# Patient Record
Sex: Female | Born: 1940
Health system: Southern US, Community
[De-identification: ages and names within clinical notes are randomized; demographics above are authoritative.]

## PROBLEM LIST (undated history)

## (undated) DIAGNOSIS — E785 Hyperlipidemia, unspecified: Secondary | ICD-10-CM

## (undated) DIAGNOSIS — Z9889 Other specified postprocedural states: Secondary | ICD-10-CM

## (undated) DIAGNOSIS — K219 Gastro-esophageal reflux disease without esophagitis: Secondary | ICD-10-CM

## (undated) DIAGNOSIS — Z8669 Personal history of other diseases of the nervous system and sense organs: Secondary | ICD-10-CM

## (undated) DIAGNOSIS — I10 Essential (primary) hypertension: Secondary | ICD-10-CM

## (undated) HISTORY — PX: RETINAL DETACHMENT SURGERY: SHX105

## (undated) HISTORY — DX: Hyperlipidemia, unspecified: E78.5

## (undated) HISTORY — DX: Gastro-esophageal reflux disease without esophagitis: K21.9

## (undated) HISTORY — PX: COLONOSCOPY: SHX174

## (undated) HISTORY — PX: ABDOMINAL HYSTERECTOMY: SHX81

---

## 2005-02-17 ENCOUNTER — Ambulatory Visit: Payer: Self-pay | Admitting: Internal Medicine

## 2005-05-19 ENCOUNTER — Ambulatory Visit: Payer: Self-pay

## 2006-02-27 ENCOUNTER — Ambulatory Visit: Payer: Self-pay | Admitting: Internal Medicine

## 2006-06-11 ENCOUNTER — Ambulatory Visit: Payer: Self-pay

## 2006-09-07 ENCOUNTER — Ambulatory Visit: Payer: Self-pay | Admitting: Gastroenterology

## 2007-02-16 ENCOUNTER — Ambulatory Visit: Payer: Self-pay | Admitting: Family Medicine

## 2007-03-03 ENCOUNTER — Ambulatory Visit: Payer: Self-pay | Admitting: Internal Medicine

## 2008-03-16 ENCOUNTER — Ambulatory Visit: Payer: Self-pay | Admitting: Internal Medicine

## 2008-12-06 ENCOUNTER — Ambulatory Visit: Payer: Self-pay | Admitting: Family Medicine

## 2009-03-22 ENCOUNTER — Ambulatory Visit: Payer: Self-pay | Admitting: Unknown Physician Specialty

## 2009-03-26 ENCOUNTER — Ambulatory Visit: Payer: Self-pay | Admitting: Internal Medicine

## 2010-04-08 ENCOUNTER — Ambulatory Visit: Payer: Self-pay | Admitting: Internal Medicine

## 2010-04-17 ENCOUNTER — Ambulatory Visit: Payer: Self-pay | Admitting: Internal Medicine

## 2011-04-24 ENCOUNTER — Ambulatory Visit: Payer: Self-pay | Admitting: Internal Medicine

## 2012-04-15 DIAGNOSIS — M545 Low back pain: Secondary | ICD-10-CM | POA: Diagnosis not present

## 2012-04-15 DIAGNOSIS — R300 Dysuria: Secondary | ICD-10-CM | POA: Diagnosis not present

## 2012-04-15 DIAGNOSIS — Z0000 Encounter for general adult medical examination without abnormal findings: Secondary | ICD-10-CM | POA: Diagnosis not present

## 2012-04-15 DIAGNOSIS — M949 Disorder of cartilage, unspecified: Secondary | ICD-10-CM | POA: Diagnosis not present

## 2012-04-15 DIAGNOSIS — I10 Essential (primary) hypertension: Secondary | ICD-10-CM | POA: Diagnosis not present

## 2012-04-15 DIAGNOSIS — E782 Mixed hyperlipidemia: Secondary | ICD-10-CM | POA: Diagnosis not present

## 2012-04-15 DIAGNOSIS — M899 Disorder of bone, unspecified: Secondary | ICD-10-CM | POA: Diagnosis not present

## 2012-04-15 DIAGNOSIS — H44119 Panuveitis, unspecified eye: Secondary | ICD-10-CM | POA: Diagnosis not present

## 2012-04-15 DIAGNOSIS — M79609 Pain in unspecified limb: Secondary | ICD-10-CM | POA: Diagnosis not present

## 2012-04-23 DIAGNOSIS — H40059 Ocular hypertension, unspecified eye: Secondary | ICD-10-CM | POA: Diagnosis not present

## 2012-04-23 DIAGNOSIS — H209 Unspecified iridocyclitis: Secondary | ICD-10-CM | POA: Diagnosis not present

## 2012-04-23 DIAGNOSIS — H44119 Panuveitis, unspecified eye: Secondary | ICD-10-CM | POA: Diagnosis not present

## 2012-04-29 ENCOUNTER — Ambulatory Visit: Payer: Self-pay | Admitting: Internal Medicine

## 2012-04-29 DIAGNOSIS — Z1231 Encounter for screening mammogram for malignant neoplasm of breast: Secondary | ICD-10-CM | POA: Diagnosis not present

## 2012-10-26 DIAGNOSIS — E782 Mixed hyperlipidemia: Secondary | ICD-10-CM | POA: Diagnosis not present

## 2012-10-26 DIAGNOSIS — R5381 Other malaise: Secondary | ICD-10-CM | POA: Diagnosis not present

## 2012-10-26 DIAGNOSIS — Z23 Encounter for immunization: Secondary | ICD-10-CM | POA: Diagnosis not present

## 2012-10-26 DIAGNOSIS — M545 Low back pain: Secondary | ICD-10-CM | POA: Diagnosis not present

## 2012-10-26 DIAGNOSIS — I10 Essential (primary) hypertension: Secondary | ICD-10-CM | POA: Diagnosis not present

## 2012-10-29 DIAGNOSIS — H40059 Ocular hypertension, unspecified eye: Secondary | ICD-10-CM | POA: Diagnosis not present

## 2012-10-29 DIAGNOSIS — Z961 Presence of intraocular lens: Secondary | ICD-10-CM | POA: Diagnosis not present

## 2012-10-29 DIAGNOSIS — H268 Other specified cataract: Secondary | ICD-10-CM | POA: Diagnosis not present

## 2012-10-29 DIAGNOSIS — H209 Unspecified iridocyclitis: Secondary | ICD-10-CM | POA: Diagnosis not present

## 2012-10-29 DIAGNOSIS — H269 Unspecified cataract: Secondary | ICD-10-CM | POA: Diagnosis not present

## 2012-10-29 DIAGNOSIS — H3320 Serous retinal detachment, unspecified eye: Secondary | ICD-10-CM | POA: Diagnosis not present

## 2012-10-29 DIAGNOSIS — H44119 Panuveitis, unspecified eye: Secondary | ICD-10-CM | POA: Diagnosis not present

## 2013-04-01 DIAGNOSIS — H40059 Ocular hypertension, unspecified eye: Secondary | ICD-10-CM | POA: Diagnosis not present

## 2013-04-01 DIAGNOSIS — H3320 Serous retinal detachment, unspecified eye: Secondary | ICD-10-CM | POA: Diagnosis not present

## 2013-04-01 DIAGNOSIS — H44119 Panuveitis, unspecified eye: Secondary | ICD-10-CM | POA: Diagnosis not present

## 2013-04-01 DIAGNOSIS — Z961 Presence of intraocular lens: Secondary | ICD-10-CM | POA: Diagnosis not present

## 2013-05-18 DIAGNOSIS — Z006 Encounter for examination for normal comparison and control in clinical research program: Secondary | ICD-10-CM | POA: Diagnosis not present

## 2013-05-18 DIAGNOSIS — E782 Mixed hyperlipidemia: Secondary | ICD-10-CM | POA: Diagnosis not present

## 2013-05-18 DIAGNOSIS — Z0000 Encounter for general adult medical examination without abnormal findings: Secondary | ICD-10-CM | POA: Diagnosis not present

## 2013-05-18 DIAGNOSIS — R300 Dysuria: Secondary | ICD-10-CM | POA: Diagnosis not present

## 2013-05-18 DIAGNOSIS — R635 Abnormal weight gain: Secondary | ICD-10-CM | POA: Diagnosis not present

## 2013-05-18 DIAGNOSIS — Z79899 Other long term (current) drug therapy: Secondary | ICD-10-CM | POA: Diagnosis not present

## 2013-05-18 DIAGNOSIS — I10 Essential (primary) hypertension: Secondary | ICD-10-CM | POA: Diagnosis not present

## 2013-05-20 ENCOUNTER — Ambulatory Visit: Payer: Self-pay | Admitting: Physician Assistant

## 2013-05-20 DIAGNOSIS — Z1231 Encounter for screening mammogram for malignant neoplasm of breast: Secondary | ICD-10-CM | POA: Diagnosis not present

## 2013-05-31 DIAGNOSIS — H269 Unspecified cataract: Secondary | ICD-10-CM | POA: Diagnosis not present

## 2013-05-31 DIAGNOSIS — Z961 Presence of intraocular lens: Secondary | ICD-10-CM | POA: Diagnosis not present

## 2013-06-08 DIAGNOSIS — E782 Mixed hyperlipidemia: Secondary | ICD-10-CM | POA: Diagnosis not present

## 2013-06-08 DIAGNOSIS — N390 Urinary tract infection, site not specified: Secondary | ICD-10-CM | POA: Diagnosis not present

## 2013-06-08 DIAGNOSIS — M25579 Pain in unspecified ankle and joints of unspecified foot: Secondary | ICD-10-CM | POA: Diagnosis not present

## 2013-06-08 DIAGNOSIS — R7301 Impaired fasting glucose: Secondary | ICD-10-CM | POA: Diagnosis not present

## 2013-06-08 DIAGNOSIS — I10 Essential (primary) hypertension: Secondary | ICD-10-CM | POA: Diagnosis not present

## 2013-06-08 DIAGNOSIS — E876 Hypokalemia: Secondary | ICD-10-CM | POA: Diagnosis not present

## 2013-07-13 DIAGNOSIS — E876 Hypokalemia: Secondary | ICD-10-CM | POA: Diagnosis not present

## 2013-09-30 DIAGNOSIS — H3320 Serous retinal detachment, unspecified eye: Secondary | ICD-10-CM | POA: Diagnosis not present

## 2013-09-30 DIAGNOSIS — Z961 Presence of intraocular lens: Secondary | ICD-10-CM | POA: Diagnosis not present

## 2013-09-30 DIAGNOSIS — H269 Unspecified cataract: Secondary | ICD-10-CM | POA: Diagnosis not present

## 2013-09-30 DIAGNOSIS — H44119 Panuveitis, unspecified eye: Secondary | ICD-10-CM | POA: Diagnosis not present

## 2013-09-30 DIAGNOSIS — H40059 Ocular hypertension, unspecified eye: Secondary | ICD-10-CM | POA: Diagnosis not present

## 2013-10-22 DIAGNOSIS — Z23 Encounter for immunization: Secondary | ICD-10-CM | POA: Diagnosis not present

## 2013-11-14 DIAGNOSIS — E876 Hypokalemia: Secondary | ICD-10-CM | POA: Diagnosis not present

## 2013-11-14 DIAGNOSIS — R300 Dysuria: Secondary | ICD-10-CM | POA: Diagnosis not present

## 2013-11-14 DIAGNOSIS — H6120 Impacted cerumen, unspecified ear: Secondary | ICD-10-CM | POA: Diagnosis not present

## 2013-11-14 DIAGNOSIS — I10 Essential (primary) hypertension: Secondary | ICD-10-CM | POA: Diagnosis not present

## 2013-11-14 DIAGNOSIS — L02818 Cutaneous abscess of other sites: Secondary | ICD-10-CM | POA: Diagnosis not present

## 2013-11-14 DIAGNOSIS — Z0000 Encounter for general adult medical examination without abnormal findings: Secondary | ICD-10-CM | POA: Diagnosis not present

## 2013-11-14 DIAGNOSIS — E782 Mixed hyperlipidemia: Secondary | ICD-10-CM | POA: Diagnosis not present

## 2013-11-23 DIAGNOSIS — H44119 Panuveitis, unspecified eye: Secondary | ICD-10-CM | POA: Diagnosis not present

## 2013-11-23 DIAGNOSIS — H33019 Retinal detachment with single break, unspecified eye: Secondary | ICD-10-CM | POA: Diagnosis not present

## 2013-11-23 DIAGNOSIS — Z961 Presence of intraocular lens: Secondary | ICD-10-CM | POA: Diagnosis not present

## 2013-11-23 DIAGNOSIS — H3320 Serous retinal detachment, unspecified eye: Secondary | ICD-10-CM | POA: Diagnosis not present

## 2014-02-08 DIAGNOSIS — H3320 Serous retinal detachment, unspecified eye: Secondary | ICD-10-CM | POA: Diagnosis not present

## 2014-02-08 DIAGNOSIS — Z961 Presence of intraocular lens: Secondary | ICD-10-CM | POA: Diagnosis not present

## 2014-02-08 DIAGNOSIS — H44119 Panuveitis, unspecified eye: Secondary | ICD-10-CM | POA: Diagnosis not present

## 2014-02-08 DIAGNOSIS — H40059 Ocular hypertension, unspecified eye: Secondary | ICD-10-CM | POA: Diagnosis not present

## 2014-05-31 DIAGNOSIS — Z961 Presence of intraocular lens: Secondary | ICD-10-CM | POA: Diagnosis not present

## 2014-05-31 DIAGNOSIS — H44119 Panuveitis, unspecified eye: Secondary | ICD-10-CM | POA: Diagnosis not present

## 2014-05-31 DIAGNOSIS — H3320 Serous retinal detachment, unspecified eye: Secondary | ICD-10-CM | POA: Diagnosis not present

## 2014-05-31 DIAGNOSIS — H40059 Ocular hypertension, unspecified eye: Secondary | ICD-10-CM | POA: Diagnosis not present

## 2014-06-12 DIAGNOSIS — E876 Hypokalemia: Secondary | ICD-10-CM | POA: Diagnosis not present

## 2014-06-12 DIAGNOSIS — R42 Dizziness and giddiness: Secondary | ICD-10-CM | POA: Diagnosis not present

## 2014-06-12 DIAGNOSIS — R7301 Impaired fasting glucose: Secondary | ICD-10-CM | POA: Diagnosis not present

## 2014-06-12 DIAGNOSIS — N390 Urinary tract infection, site not specified: Secondary | ICD-10-CM | POA: Diagnosis not present

## 2014-06-12 DIAGNOSIS — D518 Other vitamin B12 deficiency anemias: Secondary | ICD-10-CM | POA: Diagnosis not present

## 2014-06-12 DIAGNOSIS — R7989 Other specified abnormal findings of blood chemistry: Secondary | ICD-10-CM | POA: Diagnosis not present

## 2014-06-12 DIAGNOSIS — I10 Essential (primary) hypertension: Secondary | ICD-10-CM | POA: Diagnosis not present

## 2014-06-12 DIAGNOSIS — H6120 Impacted cerumen, unspecified ear: Secondary | ICD-10-CM | POA: Diagnosis not present

## 2014-06-16 ENCOUNTER — Ambulatory Visit: Payer: Self-pay | Admitting: Physician Assistant

## 2014-06-16 DIAGNOSIS — Z1231 Encounter for screening mammogram for malignant neoplasm of breast: Secondary | ICD-10-CM | POA: Diagnosis not present

## 2014-06-27 DIAGNOSIS — R42 Dizziness and giddiness: Secondary | ICD-10-CM | POA: Diagnosis not present

## 2014-06-27 DIAGNOSIS — I10 Essential (primary) hypertension: Secondary | ICD-10-CM | POA: Diagnosis not present

## 2014-06-27 DIAGNOSIS — R7301 Impaired fasting glucose: Secondary | ICD-10-CM | POA: Diagnosis not present

## 2014-08-14 DIAGNOSIS — R55 Syncope and collapse: Secondary | ICD-10-CM | POA: Diagnosis not present

## 2014-09-25 DIAGNOSIS — Z23 Encounter for immunization: Secondary | ICD-10-CM | POA: Diagnosis not present

## 2014-09-25 DIAGNOSIS — R7301 Impaired fasting glucose: Secondary | ICD-10-CM | POA: Diagnosis not present

## 2014-09-25 DIAGNOSIS — I10 Essential (primary) hypertension: Secondary | ICD-10-CM | POA: Diagnosis not present

## 2014-09-25 DIAGNOSIS — I872 Venous insufficiency (chronic) (peripheral): Secondary | ICD-10-CM | POA: Diagnosis not present

## 2014-09-27 DIAGNOSIS — Z961 Presence of intraocular lens: Secondary | ICD-10-CM | POA: Diagnosis not present

## 2014-09-27 DIAGNOSIS — H44439 Hypotony of eye due to other ocular disorders, unspecified eye: Secondary | ICD-10-CM | POA: Insufficient documentation

## 2014-09-27 DIAGNOSIS — I10 Essential (primary) hypertension: Secondary | ICD-10-CM | POA: Insufficient documentation

## 2014-09-27 DIAGNOSIS — H269 Unspecified cataract: Secondary | ICD-10-CM | POA: Diagnosis not present

## 2014-09-27 DIAGNOSIS — H209 Unspecified iridocyclitis: Secondary | ICD-10-CM | POA: Diagnosis not present

## 2014-09-27 DIAGNOSIS — H44119 Panuveitis, unspecified eye: Secondary | ICD-10-CM | POA: Diagnosis not present

## 2014-09-27 DIAGNOSIS — H3320 Serous retinal detachment, unspecified eye: Secondary | ICD-10-CM | POA: Diagnosis not present

## 2014-09-27 DIAGNOSIS — H40059 Ocular hypertension, unspecified eye: Secondary | ICD-10-CM | POA: Diagnosis not present

## 2014-10-12 DIAGNOSIS — H209 Unspecified iridocyclitis: Secondary | ICD-10-CM | POA: Insufficient documentation

## 2015-01-09 ENCOUNTER — Ambulatory Visit: Payer: Self-pay | Admitting: Internal Medicine

## 2015-01-09 ENCOUNTER — Ambulatory Visit: Payer: Self-pay | Admitting: Family Medicine

## 2015-01-09 DIAGNOSIS — R1110 Vomiting, unspecified: Secondary | ICD-10-CM | POA: Diagnosis not present

## 2015-01-09 DIAGNOSIS — Z79899 Other long term (current) drug therapy: Secondary | ICD-10-CM | POA: Diagnosis not present

## 2015-01-09 DIAGNOSIS — I10 Essential (primary) hypertension: Secondary | ICD-10-CM | POA: Diagnosis not present

## 2015-01-09 DIAGNOSIS — N12 Tubulo-interstitial nephritis, not specified as acute or chronic: Secondary | ICD-10-CM | POA: Diagnosis not present

## 2015-01-09 DIAGNOSIS — Z87891 Personal history of nicotine dependence: Secondary | ICD-10-CM | POA: Diagnosis not present

## 2015-01-09 DIAGNOSIS — R109 Unspecified abdominal pain: Secondary | ICD-10-CM | POA: Diagnosis not present

## 2015-01-09 DIAGNOSIS — E780 Pure hypercholesterolemia: Secondary | ICD-10-CM | POA: Diagnosis not present

## 2015-01-09 DIAGNOSIS — N390 Urinary tract infection, site not specified: Secondary | ICD-10-CM | POA: Diagnosis not present

## 2015-01-09 DIAGNOSIS — Z9889 Other specified postprocedural states: Secondary | ICD-10-CM | POA: Diagnosis not present

## 2015-01-09 DIAGNOSIS — Z7982 Long term (current) use of aspirin: Secondary | ICD-10-CM | POA: Diagnosis not present

## 2015-01-09 LAB — URINALYSIS, COMPLETE
BACTERIA: NEGATIVE
Bilirubin,UR: NEGATIVE
Glucose,UR: NEGATIVE
Ketone: NEGATIVE
Nitrite: NEGATIVE
PH: 6.5 (ref 5.0–8.0)
Protein: NEGATIVE
Specific Gravity: 1.01 (ref 1.000–1.030)

## 2015-01-09 LAB — CBC WITH DIFFERENTIAL/PLATELET
BASOS ABS: 0.1 10*3/uL (ref 0.0–0.1)
Basophil %: 0.8 %
EOS ABS: 0.1 10*3/uL (ref 0.0–0.7)
Eosinophil %: 1.1 %
HCT: 40.1 % (ref 35.0–47.0)
HGB: 12.8 g/dL (ref 12.0–16.0)
LYMPHS ABS: 2.1 10*3/uL (ref 1.0–3.6)
Lymphocyte %: 19.5 %
MCH: 25.8 pg — ABNORMAL LOW (ref 26.0–34.0)
MCHC: 31.9 g/dL — AB (ref 32.0–36.0)
MCV: 81 fL (ref 80–100)
Monocyte #: 1 x10 3/mm — ABNORMAL HIGH (ref 0.2–0.9)
Monocyte %: 9.5 %
NEUTROS PCT: 69.1 %
Neutrophil #: 7.3 10*3/uL — ABNORMAL HIGH (ref 1.4–6.5)
Platelet: 259 10*3/uL (ref 150–440)
RBC: 4.95 10*6/uL (ref 3.80–5.20)
RDW: 15.7 % — AB (ref 11.5–14.5)
WBC: 10.6 10*3/uL (ref 3.6–11.0)

## 2015-01-09 LAB — COMPREHENSIVE METABOLIC PANEL
ALK PHOS: 53 U/L
ALT: 20 U/L
Albumin: 3.4 g/dL (ref 3.4–5.0)
Anion Gap: 7 (ref 7–16)
BUN: 20 mg/dL — AB (ref 7–18)
Bilirubin,Total: 0.6 mg/dL (ref 0.2–1.0)
CO2: 31 mmol/L (ref 21–32)
CREATININE: 1.59 mg/dL — AB (ref 0.60–1.30)
Calcium, Total: 9.1 mg/dL (ref 8.5–10.1)
Chloride: 106 mmol/L (ref 98–107)
EGFR (African American): 41 — ABNORMAL LOW
GFR CALC NON AF AMER: 34 — AB
Glucose: 107 mg/dL — ABNORMAL HIGH (ref 65–99)
Osmolality: 290 (ref 275–301)
Potassium: 3 mmol/L — ABNORMAL LOW (ref 3.5–5.1)
SGOT(AST): 21 U/L (ref 15–37)
Sodium: 144 mmol/L (ref 136–145)
TOTAL PROTEIN: 7.6 g/dL (ref 6.4–8.2)

## 2015-01-09 LAB — LIPASE, BLOOD: LIPASE: 99 U/L (ref 73–393)

## 2015-01-09 LAB — AMYLASE: AMYLASE: 50 U/L (ref 25–115)

## 2015-01-11 LAB — URINE CULTURE

## 2015-01-17 DIAGNOSIS — H3321 Serous retinal detachment, right eye: Secondary | ICD-10-CM | POA: Diagnosis not present

## 2015-01-17 DIAGNOSIS — H44112 Panuveitis, left eye: Secondary | ICD-10-CM | POA: Diagnosis not present

## 2015-01-17 DIAGNOSIS — Z961 Presence of intraocular lens: Secondary | ICD-10-CM | POA: Diagnosis not present

## 2015-01-17 DIAGNOSIS — H209 Unspecified iridocyclitis: Secondary | ICD-10-CM | POA: Diagnosis not present

## 2015-01-17 DIAGNOSIS — H44431 Hypotony of eye due to other ocular disorders, right eye: Secondary | ICD-10-CM | POA: Diagnosis not present

## 2015-01-24 DIAGNOSIS — I10 Essential (primary) hypertension: Secondary | ICD-10-CM | POA: Diagnosis not present

## 2015-01-24 DIAGNOSIS — R7301 Impaired fasting glucose: Secondary | ICD-10-CM | POA: Diagnosis not present

## 2015-01-24 DIAGNOSIS — N390 Urinary tract infection, site not specified: Secondary | ICD-10-CM | POA: Diagnosis not present

## 2015-01-24 DIAGNOSIS — N159 Renal tubulo-interstitial disease, unspecified: Secondary | ICD-10-CM | POA: Diagnosis not present

## 2015-01-29 ENCOUNTER — Ambulatory Visit: Payer: Self-pay | Admitting: Physician Assistant

## 2015-01-29 DIAGNOSIS — I10 Essential (primary) hypertension: Secondary | ICD-10-CM | POA: Diagnosis not present

## 2015-01-29 LAB — CBC WITH DIFFERENTIAL/PLATELET
BASOS PCT: 0.5 %
Basophil #: 0 10*3/uL (ref 0.0–0.1)
Eosinophil #: 0.3 10*3/uL (ref 0.0–0.7)
Eosinophil %: 3.4 %
HCT: 37.2 % (ref 35.0–47.0)
HGB: 11.6 g/dL — ABNORMAL LOW (ref 12.0–16.0)
LYMPHS ABS: 2.7 10*3/uL (ref 1.0–3.6)
Lymphocyte %: 28.4 %
MCH: 25.5 pg — ABNORMAL LOW (ref 26.0–34.0)
MCHC: 31.2 g/dL — ABNORMAL LOW (ref 32.0–36.0)
MCV: 82 fL (ref 80–100)
MONOS PCT: 6.6 %
Monocyte #: 0.6 x10 3/mm (ref 0.2–0.9)
NEUTROS ABS: 5.7 10*3/uL (ref 1.4–6.5)
Neutrophil %: 61.1 %
Platelet: 272 10*3/uL (ref 150–440)
RBC: 4.55 10*6/uL (ref 3.80–5.20)
RDW: 16.1 % — ABNORMAL HIGH (ref 11.5–14.5)
WBC: 9.4 10*3/uL (ref 3.6–11.0)

## 2015-01-29 LAB — COMPREHENSIVE METABOLIC PANEL
ALK PHOS: 59 U/L (ref 46–116)
Albumin: 3.6 g/dL (ref 3.4–5.0)
Anion Gap: 8 (ref 7–16)
BUN: 10 mg/dL (ref 7–18)
Bilirubin,Total: 0.4 mg/dL (ref 0.2–1.0)
CALCIUM: 8.9 mg/dL (ref 8.5–10.1)
CO2: 31 mmol/L (ref 21–32)
Chloride: 105 mmol/L (ref 98–107)
Creatinine: 0.91 mg/dL (ref 0.60–1.30)
Glucose: 99 mg/dL (ref 65–99)
Osmolality: 286 (ref 275–301)
POTASSIUM: 3.1 mmol/L — AB (ref 3.5–5.1)
SGOT(AST): 16 U/L (ref 15–37)
SGPT (ALT): 15 U/L (ref 14–63)
SODIUM: 144 mmol/L (ref 136–145)
Total Protein: 6.9 g/dL (ref 6.4–8.2)

## 2015-02-19 ENCOUNTER — Other Ambulatory Visit: Payer: Self-pay | Admitting: Physician Assistant

## 2015-02-19 DIAGNOSIS — E876 Hypokalemia: Secondary | ICD-10-CM | POA: Diagnosis not present

## 2015-02-19 DIAGNOSIS — D649 Anemia, unspecified: Secondary | ICD-10-CM | POA: Diagnosis not present

## 2015-04-18 DIAGNOSIS — H209 Unspecified iridocyclitis: Secondary | ICD-10-CM | POA: Diagnosis not present

## 2015-04-18 DIAGNOSIS — H44112 Panuveitis, left eye: Secondary | ICD-10-CM | POA: Diagnosis not present

## 2015-04-18 DIAGNOSIS — Z961 Presence of intraocular lens: Secondary | ICD-10-CM | POA: Diagnosis not present

## 2015-04-18 DIAGNOSIS — H40052 Ocular hypertension, left eye: Secondary | ICD-10-CM | POA: Diagnosis not present

## 2015-04-18 DIAGNOSIS — H44431 Hypotony of eye due to other ocular disorders, right eye: Secondary | ICD-10-CM | POA: Diagnosis not present

## 2015-04-18 DIAGNOSIS — I10 Essential (primary) hypertension: Secondary | ICD-10-CM | POA: Diagnosis not present

## 2015-04-18 DIAGNOSIS — H269 Unspecified cataract: Secondary | ICD-10-CM | POA: Diagnosis not present

## 2015-04-18 DIAGNOSIS — H3321 Serous retinal detachment, right eye: Secondary | ICD-10-CM | POA: Diagnosis not present

## 2015-05-24 DIAGNOSIS — R300 Dysuria: Secondary | ICD-10-CM | POA: Diagnosis not present

## 2015-05-24 DIAGNOSIS — H44111 Panuveitis, right eye: Secondary | ICD-10-CM | POA: Diagnosis not present

## 2015-05-24 DIAGNOSIS — M859 Disorder of bone density and structure, unspecified: Secondary | ICD-10-CM | POA: Diagnosis not present

## 2015-05-24 DIAGNOSIS — R7301 Impaired fasting glucose: Secondary | ICD-10-CM | POA: Diagnosis not present

## 2015-05-24 DIAGNOSIS — D649 Anemia, unspecified: Secondary | ICD-10-CM | POA: Diagnosis not present

## 2015-05-24 DIAGNOSIS — I10 Essential (primary) hypertension: Secondary | ICD-10-CM | POA: Diagnosis not present

## 2015-05-24 DIAGNOSIS — I739 Peripheral vascular disease, unspecified: Secondary | ICD-10-CM | POA: Diagnosis not present

## 2015-05-24 DIAGNOSIS — Z0001 Encounter for general adult medical examination with abnormal findings: Secondary | ICD-10-CM | POA: Diagnosis not present

## 2015-05-30 DIAGNOSIS — E782 Mixed hyperlipidemia: Secondary | ICD-10-CM | POA: Diagnosis not present

## 2015-05-30 DIAGNOSIS — R7301 Impaired fasting glucose: Secondary | ICD-10-CM | POA: Diagnosis not present

## 2015-05-30 DIAGNOSIS — D649 Anemia, unspecified: Secondary | ICD-10-CM | POA: Diagnosis not present

## 2015-06-07 DIAGNOSIS — I739 Peripheral vascular disease, unspecified: Secondary | ICD-10-CM | POA: Diagnosis not present

## 2015-06-21 DIAGNOSIS — Z13820 Encounter for screening for osteoporosis: Secondary | ICD-10-CM | POA: Diagnosis not present

## 2015-06-21 DIAGNOSIS — Z780 Asymptomatic menopausal state: Secondary | ICD-10-CM | POA: Diagnosis not present

## 2015-06-21 DIAGNOSIS — E2839 Other primary ovarian failure: Secondary | ICD-10-CM | POA: Diagnosis not present

## 2015-08-01 ENCOUNTER — Other Ambulatory Visit: Payer: Self-pay | Admitting: Physician Assistant

## 2015-08-01 DIAGNOSIS — Z1231 Encounter for screening mammogram for malignant neoplasm of breast: Secondary | ICD-10-CM

## 2015-08-08 ENCOUNTER — Ambulatory Visit: Payer: Self-pay

## 2015-08-14 ENCOUNTER — Ambulatory Visit
Admission: RE | Admit: 2015-08-14 | Discharge: 2015-08-14 | Disposition: A | Discharge status: Home / Self Care | Payer: Medicare Other | Source: Ambulatory Visit | Attending: Physician Assistant | Admitting: Physician Assistant

## 2015-08-14 DIAGNOSIS — Z1231 Encounter for screening mammogram for malignant neoplasm of breast: Secondary | ICD-10-CM | POA: Diagnosis present

## 2015-08-24 DIAGNOSIS — H44112 Panuveitis, left eye: Secondary | ICD-10-CM | POA: Diagnosis not present

## 2015-08-24 DIAGNOSIS — H4440 Unspecified hypotony of eye: Secondary | ICD-10-CM | POA: Diagnosis not present

## 2015-08-24 DIAGNOSIS — H269 Unspecified cataract: Secondary | ICD-10-CM | POA: Diagnosis not present

## 2015-08-24 DIAGNOSIS — H209 Unspecified iridocyclitis: Secondary | ICD-10-CM | POA: Diagnosis not present

## 2015-08-24 DIAGNOSIS — H3321 Serous retinal detachment, right eye: Secondary | ICD-10-CM | POA: Diagnosis not present

## 2015-08-24 DIAGNOSIS — H40052 Ocular hypertension, left eye: Secondary | ICD-10-CM | POA: Diagnosis not present

## 2015-09-06 ENCOUNTER — Encounter: Payer: Self-pay | Admitting: Emergency Medicine

## 2015-09-06 ENCOUNTER — Inpatient Hospital Stay
Admission: EM | Admit: 2015-09-06 | Discharge: 2015-09-08 | DRG: 372 | Disposition: A | Discharge status: Home / Self Care | Payer: Medicare Other | Attending: Internal Medicine | Admitting: Internal Medicine

## 2015-09-06 ENCOUNTER — Ambulatory Visit (INDEPENDENT_AMBULATORY_CARE_PROVIDER_SITE_OTHER)
Admission: EM | Admit: 2015-09-06 | Discharge: 2015-09-06 | Disposition: A | Discharge status: Home / Self Care | Payer: Medicare Other | Source: Home / Self Care | Attending: Family Medicine | Admitting: Family Medicine

## 2015-09-06 ENCOUNTER — Emergency Department: Payer: Medicare Other

## 2015-09-06 DIAGNOSIS — E785 Hyperlipidemia, unspecified: Secondary | ICD-10-CM | POA: Diagnosis present

## 2015-09-06 DIAGNOSIS — D72829 Elevated white blood cell count, unspecified: Secondary | ICD-10-CM | POA: Diagnosis not present

## 2015-09-06 DIAGNOSIS — K7689 Other specified diseases of liver: Secondary | ICD-10-CM | POA: Diagnosis not present

## 2015-09-06 DIAGNOSIS — E876 Hypokalemia: Secondary | ICD-10-CM | POA: Diagnosis not present

## 2015-09-06 DIAGNOSIS — N3000 Acute cystitis without hematuria: Secondary | ICD-10-CM | POA: Diagnosis present

## 2015-09-06 DIAGNOSIS — H5441 Blindness, right eye, normal vision left eye: Secondary | ICD-10-CM | POA: Diagnosis present

## 2015-09-06 DIAGNOSIS — R197 Diarrhea, unspecified: Secondary | ICD-10-CM | POA: Diagnosis not present

## 2015-09-06 DIAGNOSIS — N390 Urinary tract infection, site not specified: Secondary | ICD-10-CM | POA: Diagnosis not present

## 2015-09-06 DIAGNOSIS — R1084 Generalized abdominal pain: Secondary | ICD-10-CM | POA: Diagnosis not present

## 2015-09-06 DIAGNOSIS — Z87891 Personal history of nicotine dependence: Secondary | ICD-10-CM | POA: Diagnosis not present

## 2015-09-06 DIAGNOSIS — N3001 Acute cystitis with hematuria: Secondary | ICD-10-CM | POA: Diagnosis not present

## 2015-09-06 DIAGNOSIS — A047 Enterocolitis due to Clostridium difficile: Principal | ICD-10-CM | POA: Diagnosis present

## 2015-09-06 DIAGNOSIS — I10 Essential (primary) hypertension: Secondary | ICD-10-CM | POA: Diagnosis present

## 2015-09-06 DIAGNOSIS — K5730 Diverticulosis of large intestine without perforation or abscess without bleeding: Secondary | ICD-10-CM | POA: Diagnosis not present

## 2015-09-06 HISTORY — DX: Personal history of other diseases of the nervous system and sense organs: Z86.69

## 2015-09-06 HISTORY — DX: Other specified postprocedural states: Z98.890

## 2015-09-06 HISTORY — DX: Essential (primary) hypertension: I10

## 2015-09-06 LAB — CBC WITH DIFFERENTIAL/PLATELET
BASOS PCT: 0 %
Basophils Absolute: 0 10*3/uL (ref 0–0.1)
EOS ABS: 0.1 10*3/uL (ref 0–0.7)
EOS PCT: 1 %
HCT: 39.8 % (ref 35.0–47.0)
HEMOGLOBIN: 12.6 g/dL (ref 12.0–16.0)
Lymphocytes Relative: 13 %
Lymphs Abs: 1.8 10*3/uL (ref 1.0–3.6)
MCH: 25.4 pg — AB (ref 26.0–34.0)
MCHC: 31.5 g/dL — AB (ref 32.0–36.0)
MCV: 80.6 fL (ref 80.0–100.0)
MONOS PCT: 6 %
Monocytes Absolute: 0.8 10*3/uL (ref 0.2–0.9)
NEUTROS PCT: 80 %
Neutro Abs: 11.4 10*3/uL — ABNORMAL HIGH (ref 1.4–6.5)
PLATELETS: 257 10*3/uL (ref 150–440)
RBC: 4.95 MIL/uL (ref 3.80–5.20)
RDW: 15.8 % — AB (ref 11.5–14.5)
WBC: 14.1 10*3/uL — ABNORMAL HIGH (ref 3.6–11.0)

## 2015-09-06 LAB — COMPREHENSIVE METABOLIC PANEL
ALBUMIN: 4.2 g/dL (ref 3.5–5.0)
ALK PHOS: 61 U/L (ref 38–126)
ALT: 14 U/L (ref 14–54)
ANION GAP: 10 (ref 5–15)
AST: 22 U/L (ref 15–41)
BUN: 11 mg/dL (ref 6–20)
CHLORIDE: 99 mmol/L — AB (ref 101–111)
CO2: 32 mmol/L (ref 22–32)
Calcium: 9 mg/dL (ref 8.9–10.3)
Creatinine, Ser: 0.88 mg/dL (ref 0.44–1.00)
GFR calc non Af Amer: >60 mL/min (ref >60)
GLUCOSE: 93 mg/dL (ref 65–99)
POTASSIUM: 2.7 mmol/L — AB (ref 3.5–5.1)
SODIUM: 141 mmol/L (ref 135–145)
Total Bilirubin: 0.7 mg/dL (ref 0.3–1.2)
Total Protein: 7.9 g/dL (ref 6.5–8.1)

## 2015-09-06 LAB — C DIFFICILE QUICK SCREEN W PCR REFLEX
C Diff antigen: POSITIVE — AB
C Diff toxin: NEGATIVE

## 2015-09-06 LAB — URINALYSIS COMPLETE WITH MICROSCOPIC (ARMC ONLY)
Glucose, UA: NEGATIVE mg/dL
NITRITE: NEGATIVE
PH: 5.5 (ref 5.0–8.0)
Protein, ur: 30 mg/dL — AB

## 2015-09-06 LAB — CBC
HCT: 36.2 % (ref 35.0–47.0)
HEMOGLOBIN: 11.5 g/dL — AB (ref 12.0–16.0)
MCH: 25.8 pg — AB (ref 26.0–34.0)
MCHC: 31.9 g/dL — ABNORMAL LOW (ref 32.0–36.0)
MCV: 80.8 fL (ref 80.0–100.0)
Platelets: 238 10*3/uL (ref 150–440)
RBC: 4.47 MIL/uL (ref 3.80–5.20)
RDW: 16 % — ABNORMAL HIGH (ref 11.5–14.5)
WBC: 13.8 10*3/uL — ABNORMAL HIGH (ref 3.6–11.0)

## 2015-09-06 LAB — LIPASE, BLOOD: Lipase: <10 U/L — ABNORMAL LOW (ref 22–51)

## 2015-09-06 LAB — CLOSTRIDIUM DIFFICILE BY PCR: CDIFFPCR: POSITIVE — AB

## 2015-09-06 LAB — CREATININE, SERUM
Creatinine, Ser: 0.88 mg/dL (ref 0.44–1.00)
GFR calc Af Amer: >60 mL/min (ref >60)
GFR calc non Af Amer: >60 mL/min (ref >60)

## 2015-09-06 MED ORDER — IOHEXOL 350 MG/ML SOLN
100.0000 mL | Freq: Once | INTRAVENOUS | Status: AC | PRN
  Administered 2015-09-06: 100 mL via INTRAVENOUS

## 2015-09-06 MED ORDER — SODIUM CHLORIDE 0.9 % IV BOLUS (SEPSIS)
500.0000 mL | Freq: Once | INTRAVENOUS | Status: AC
  Administered 2015-09-06: 500 mL via INTRAVENOUS

## 2015-09-06 MED ORDER — METRONIDAZOLE 500 MG PO TABS
500.0000 mg | ORAL_TABLET | Freq: Three times a day (TID) | ORAL | Status: DC
  Administered 2015-09-07 – 2015-09-08 (×6): 500 mg via ORAL
  Filled 2015-09-06 (×6): qty 1

## 2015-09-06 MED ORDER — CEFTRIAXONE SODIUM 1 G IJ SOLR
1.0000 g | Freq: Once | INTRAMUSCULAR | Status: AC
  Administered 2015-09-06: 1 g via INTRAVENOUS
  Filled 2015-09-06: qty 10

## 2015-09-06 MED ORDER — AMLODIPINE BESYLATE 10 MG PO TABS
10.0000 mg | ORAL_TABLET | Freq: Every day | ORAL | Status: DC
  Administered 2015-09-07: 10:00:00 10 mg via ORAL
  Filled 2015-09-06 (×2): qty 1

## 2015-09-06 MED ORDER — ACETAMINOPHEN 650 MG RE SUPP: 650.0000 mg | Freq: Four times a day (QID) | RECTAL | Status: DC | PRN

## 2015-09-06 MED ORDER — SODIUM CHLORIDE 0.9 % IJ SOLN
3.0000 mL | Freq: Two times a day (BID) | INTRAMUSCULAR | Status: DC
  Administered 2015-09-06 – 2015-09-07 (×3): 3 mL via INTRAVENOUS

## 2015-09-06 MED ORDER — HOMATROPINE HBR 5 % OP SOLN: 1.0000 [drp] | Freq: Four times a day (QID) | OPHTHALMIC | Status: DC

## 2015-09-06 MED ORDER — ACETAMINOPHEN 325 MG PO TABS: 650.0000 mg | ORAL_TABLET | Freq: Four times a day (QID) | ORAL | Status: DC | PRN

## 2015-09-06 MED ORDER — IOHEXOL 240 MG/ML SOLN: 25.0000 mL | Freq: Once | INTRAMUSCULAR | Status: DC | PRN

## 2015-09-06 MED ORDER — ENOXAPARIN SODIUM 40 MG/0.4ML ~~LOC~~ SOLN
40.0000 mg | SUBCUTANEOUS | Status: DC
  Administered 2015-09-06 – 2015-09-07 (×2): 40 mg via SUBCUTANEOUS
  Filled 2015-09-06 (×2): qty 0.4

## 2015-09-06 MED ORDER — POTASSIUM CHLORIDE 10 MEQ/100ML IV SOLN
10.0000 meq | INTRAVENOUS | Status: AC
  Administered 2015-09-06 – 2015-09-07 (×2): 10 meq via INTRAVENOUS
  Filled 2015-09-06 (×2): qty 100

## 2015-09-06 MED ORDER — SIMVASTATIN 10 MG PO TABS
10.0000 mg | ORAL_TABLET | Freq: Every day | ORAL | Status: DC
  Administered 2015-09-06 – 2015-09-07 (×2): 10 mg via ORAL
  Filled 2015-09-06 (×2): qty 1

## 2015-09-06 MED ORDER — POTASSIUM CHLORIDE CRYS ER 20 MEQ PO TBCR
30.0000 meq | EXTENDED_RELEASE_TABLET | Freq: Once | ORAL | Status: AC
  Administered 2015-09-06: 30 meq via ORAL
  Filled 2015-09-06: qty 2

## 2015-09-06 MED ORDER — POTASSIUM CHLORIDE 10 MEQ/100ML IV SOLN
10.0000 meq | Freq: Once | INTRAVENOUS | Status: AC
  Administered 2015-09-06: 10 meq via INTRAVENOUS
  Filled 2015-09-06: qty 100

## 2015-09-06 MED ORDER — SODIUM CHLORIDE 0.9 % IV SOLN
Freq: Once | INTRAVENOUS | Status: AC
  Administered 2015-09-06: 19:00:00 via INTRAVENOUS

## 2015-09-06 NOTE — ED Notes (Signed)
CT notified that pt has finished her PO contrast.

## 2015-09-06 NOTE — ED Notes (Signed)
2 sets of blood cultures taken and sent 1st set right [facility] 2nd set Left [facility]

## 2015-09-06 NOTE — ED Notes (Signed)
EMS present to transport to [facility] ED.

## 2015-09-06 NOTE — ED Provider Notes (Signed)
[facility] Emergency Department Provider Note  ____________________________________________  Time seen: Approximately 12:48 PM  I have reviewed the triage vital signs and the nursing notes.   HISTORY  Chief Complaint Abdominal Pain and Back Pain   HPI Kristy Sellers is a 74 y.o. female presents with complaints of generalized abdominal pain, intermittent diarrhea and low back pain. Patient reports since Saturday she has had intermittent softer stools and states the last 24 hours she has had diarrhea. States yesterday she had diarrhea approximately 10-15 times and states at least 7-8 times a day. Denies blood in stool, blood in toilet, blood with wiping or change in coloration stool including dark stool.  Also reports feeling weak.   States she normally has some constipation with bowel movements 2-3 times per week. States softer stools are abnormal for her. States in last 24 hours she has had diarrhea and increased abdominal discomfort. States abdominal pain is worse just prior to needing to have a bowel movement but does not resolve with bowel movement. States also with some low back pain, that is unchanged by movement. States yesterday and today with some nausea, denies vomiting, but states took ODT zofran which helped with nausea. Denies fevers.   Denies recent sick contacts. Denies others in household with similar. Denies changes in foods. Denies dysuria or urinary changes.   States  Has not eaten any food today but states has drank some water today. Denies recent antibiotic use.   Past Medical History  Diagnosis Date  . Hypertension   . History of detached retina repair     There are no active problems to display for this patient.   Past Surgical History  Procedure Laterality Date  . Abdominal hysterectomy      Current Outpatient Rx  Name  Route  Sig  Dispense  Refill  . amLODipine (NORVASC) 10 MG tablet   Oral   Take 10 mg by mouth daily.          Marland Kitchen losartan-hydrochlorothiazide (HYZAAR) 100-25 MG per tablet   Oral   Take 1 tablet by mouth daily.           Allergies Review of patient's allergies indicates no known allergies.  History reviewed. No pertinent family history.  Social History Social History  Substance Use Topics  . Smoking status: Former Research scientist (life sciences)  . Smokeless tobacco: None  . Alcohol Use: No    Review of Systems Constitutional: No fever/chills Eyes: No visual changes. ENT: No sore throat. Cardiovascular: Denies chest pain. Respiratory: Denies shortness of breath. Gastrointestinal: positive for abdominal pain.  positive nausea, no vomiting.  Positive diarrhea.  No constipation. Genitourinary: Negative for dysuria. Musculoskeletal: positive for back pain as above.  Skin: Negative for rash. Neurological: Negative for headaches, focal weakness or numbness.  10-point ROS otherwise negative.  ____________________________________________   PHYSICAL EXAM:  VITAL SIGNS: ED Triage Vitals  Enc Vitals Group     BP 09/06/15 1225 115/61 mmHg     Pulse Rate 09/06/15 1225 72     Resp 09/06/15 1225 16     Temp 09/06/15 1225 97 F (36.1 C)     Temp Source 09/06/15 1225 Tympanic     SpO2 09/06/15 1225 96 %     Weight 09/06/15 1225 165 lb (74.844 kg)     Height 09/06/15 1225 5\' 5"  (1.651 m)     Head Cir --      Peak Flow --      Pain Score 09/06/15 1228 7  Pain Loc --      Pain Edu? --      Excl. in [place]? --    Today's Vitals   09/06/15 1225 09/06/15 1228 09/06/15 1500  BP: 115/61  122/59  Pulse: 72  74  Temp: 97 F (36.1 C)    TempSrc: Tympanic    Resp: 16  16  Height: 5\' 5"  (1.651 m)    Weight: 165 lb (74.844 kg)    SpO2: 96%  99%  PainSc:  7    Constitutional: Alert and oriented. Well appearing and in no acute distress. Eyes: Conjunctivae are normal. PERRL. EOMI. Head: Atraumatic.  Nose: No congestion/rhinnorhea.  Mouth/Throat: Mucous membranes are moist.  Oropharynx  non-erythematous. Neck: No stridor.  No cervical spine tenderness to palpation. Hematological/Lymphatic/Immunilogical: No cervical lymphadenopathy. Cardiovascular: Normal rate, regular rhythm. Grossly normal heart sounds.  Good peripheral circulation. Respiratory: Normal respiratory effort.  No retractions. Lungs CTAB. Gastrointestinal: Soft. Generalized mod TTP.  Normal Bowel sounds.  No abdominal bruits. No CVA tenderness. Musculoskeletal: No lower or upper extremity tenderness nor edema.  No joint effusions. Bilateral pedal pulses equal and easily palpated. Mild right lower lumbar TTP. No midline TTP.  Neurologic:  Normal speech and language. No gross focal neurologic deficits are appreciated. No gait instability. Skin:  Skin is warm, dry and intact. No rash noted. Psychiatric: Mood and affect are normal. Speech and behavior are normal.  ____________________________________________   LABS (all labs ordered are listed, but only abnormal results are displayed)  Labs Reviewed  URINALYSIS COMPLETEWITH MICROSCOPIC ([facility] ONLY) - Abnormal; Notable for the following:    Color, Urine AMBER (*)    APPearance HAZY (*)    Bilirubin Urine 2+ (*)    Ketones, ur TRACE (*)    Specific Gravity, Urine >1.030 (*)    Hgb urine dipstick TRACE (*)    Protein, ur 30 (*)    Leukocytes, UA 2+ (*)    Bacteria, UA MANY (*)    Squamous Epithelial / LPF 6-30 (*)    All other components within normal limits  CBC WITH DIFFERENTIAL/PLATELET - Abnormal; Notable for the following:    WBC 14.1 (*)    MCH 25.4 (*)    MCHC 31.5 (*)    RDW 15.8 (*)    Neutro Abs 11.4 (*)    All other components within normal limits  COMPREHENSIVE METABOLIC PANEL - Abnormal; Notable for the following:    Potassium 2.7 (*)    Chloride 99 (*)    All other components within normal limits  LIPASE, BLOOD - Abnormal; Notable for the following:    Lipase <10 (*)    All other components within normal limits  URINE CULTURE    ORTHOSTATICS Orthostatic Lying  - BP- Lying: 104/53 mmHg ; Pulse- Lying: 67  Orthostatic Sitting - BP- Sitting: 122/59 mmHg ; Pulse- Sitting: 74  Orthostatic Standing at 0 minutes - BP- Standing at 0 minutes: 99/54 mmHg ; Pulse- Standing at 0 minutes: 84  EKG Interpreted by Dr Alveta Heimlich.  60 bpm. Normal sinus rhythm, incomplete right bundle branch block, left anterior fascicular block and nonspecific t wave abnormality.   INITIAL IMPRESSION / ASSESSMENT AND PLAN / ED COURSE  Pertinent labs & imaging results that were available during my care of the patient were reviewed by me and considered in my medical decision making (see chart for details).  Well appearing patient, no acute distress. Presents with husband at beside. Presents for complaints of abdominal pain, nausea, and diarrhea x  24 hours. Abd with generalized mod TTP. WBC 14.1. Urinalysis positive for UTI, will culture. Awaiting remaining labs. Concerned for causes of abdominal pain and considering outpatient CT abdomen for causes other than UTI.   1420: CMP labs resulted. Potassium 2.7 Patient with continued abdominal pain and multiple trips to bathroom in urgent care. Will obtain EKG.  Discussed patient and plan of care with Dr Alveta Heimlich who agrees with plan.  EKG with changes above. Unable to find previous EKGs in computer system for comparison. And patient hypokalemic.   As patient with continued abdominal pain, elevated WBC, patient needs further abdominal evaluation such as with CT abdomen.  Patient also with potassium of 2.7. Patient also now reports she is feeling very weak. Orthostatic vitals.  Discussed with patient and husband, and discussed patient need to proceed to Emergency room of her choice for further evaluation and monitoring. Patient verbalized understanding and agreed to plan. Patient requests to go to [facility] ER and reports would prefer to go by EMS. Discussed with patient and encourage EMS transportation for  cardiac monitoring. Patient alert and oriented with decisional capacity and will be transferred to [facility] ER via EMS. Marya Amsler Warehouse manager at [facility] notified.   1505: EMS at bedside for transportation.   ____________________________________________   FINAL CLINICAL IMPRESSION(S) / ED DIAGNOSES  Final diagnoses:  Generalized abdominal pain  UTI (lower urinary tract infection)  Hypokalemia  Diarrhea       Marylene Land, NP 09/06/15 1529

## 2015-09-06 NOTE — ED Notes (Signed)
Patient c/o lower abdominal pain and back pain with diarrhea for the past 24 hours.  Patient reports nausea.

## 2015-09-06 NOTE — ED Notes (Signed)
Pt had a large diarrhea BM and soiled herself. Pt is able to ambulated and handle self care. Pt was provided towel, wash clothes, mesh underwear and paper pants to change into.  Pt in NAD at this time will to monitor pt at all times.  Pt's IV potassium has been requested form pharmacy.

## 2015-09-06 NOTE — H&P (Signed)
[place] [facility] [place] NAME: Kristy Sellers    MR#:  [id number]  DATE OF BIRTH:  Jun 11, 1941  DATE OF ADMISSION:  09/06/2015  PRIMARY CARE PHYSICIAN: Christie Nottingham., PA   REQUESTING/REFERRING PHYSICIAN:   CHIEF COMPLAINT:   Chief Complaint  Patient presents with  . Diarrhea    HISTORY OF PRESENT ILLNESS: Kristy Sellers  is a 74 y.o. 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 When the patient, she was doing well up until approximately a week ago when she started having, diarrhea. Patient's sister was described as watery with no blood coming every 1 hour. Patient lost her appetite and she has been complaining also of severe abdominal pain in lower part of abdomen, as a tenesmus and improvement of her pain after defecation. She has been feeling feverish and chilly over the past 2 days and the very sweaty, drenching some cold sweats. She was able to drink some fluids. On arrival to the hospital. Patient's potassium level was found to be low at 2.7. She was also noted to cystitis and elevated white blood cell count of 14,000. Hospitalist services were contacted for admission  PAST MEDICAL HISTORY:   Past Medical History  Diagnosis Date  . Hypertension   . History of detached retina repair     PAST SURGICAL HISTORY:  Past Surgical History  Procedure Laterality Date  . Abdominal hysterectomy      SOCIAL HISTORY:  Social History  Substance Use Topics  . Smoking status: Former Research scientist (life sciences)  . Smokeless tobacco: Not on file  . Alcohol Use: No    FAMILY HISTORY: History reviewed. No pertinent family history.  DRUG ALLERGIES: No Known Allergies  Review of Systems  Constitutional: Positive for fever and chills. Negative for weight loss and malaise/fatigue.  HENT: Negative for congestion.   Eyes:  Negative for blurred vision and double vision.  Respiratory: Negative for cough, sputum production, shortness of breath and wheezing.   Cardiovascular: Negative for chest pain, palpitations, orthopnea, leg swelling and PND.  Gastrointestinal: Positive for nausea, abdominal pain and diarrhea. Negative for vomiting, constipation, blood in stool and melena.  Genitourinary: Negative for dysuria, urgency, frequency and hematuria.  Musculoskeletal: Negative for falls.  Skin: Negative for rash.  Neurological: Negative for dizziness and weakness.  Psychiatric/Behavioral: Negative for depression and memory loss. The patient is not nervous/anxious.     MEDICATIONS AT HOME:  Prior to Admission medications   Medication Sig Start Date End Date Taking? Authorizing Provider  amLODipine (NORVASC) 10 MG tablet Take 10 mg by mouth daily.   Yes Historical Provider, MD  HOMATROPAIRE 5 % ophthalmic solution Apply 1 drop to eye 4 (four) times daily. Place in right eye   Yes Historical Provider, MD  losartan-hydrochlorothiazide (HYZAAR) 100-25 MG per tablet Take 1 tablet by mouth daily.   Yes Historical Provider, MD  simvastatin (ZOCOR) 10 MG tablet Take 10 mg by mouth at bedtime.   Yes Historical Provider, MD      PHYSICAL EXAMINATION:   VITAL SIGNS: Blood pressure 123/66, pulse 96, temperature 98.1 F (36.7 C), temperature source Oral, height 5\' 5"  (1.651 m), weight 74.844 kg (165 lb), SpO2 100 %.  GENERAL:  74 y.o.-year-old patient lying in the bed with no acute distress. Pale and uncomfortable lying sideways on the stretcher. Strong fecal odor EYES: Pupils  equal, round, reactive to light and accommodation. No scleral icterus. Extraocular muscles intact.  HEENT: Head atraumatic, normocephalic. Oropharynx and nasopharynx clear.  NECK:  Supple, no jugular venous distention. No thyroid enlargement, no tenderness.  LUNGS: Normal breath sounds bilaterally, no wheezing, rales,rhonchi or crepitation. No use of  accessory muscles of respiration.  CARDIOVASCULAR: S1, S2 normal. No murmurs, rubs, or gallops.  ABDOMEN: Soft, nontender, nondistended. Bowel sounds diminished in all quadrants. Discomfort on palpation in the suprapubic area but no rebound or guarding was noted.  No organomegaly or mass.  EXTREMITIES: No pedal edema, cyanosis, or clubbing.  NEUROLOGIC: Cranial nerves II through XII are intact. Muscle strength 5/5 in all extremities. Sensation intact. Gait not checked.  PSYCHIATRIC: The patient is alert and oriented x 3.  SKIN: No obvious rash, lesion, or ulcer.   LABORATORY PANEL:   CBC  Recent Labs Lab 09/06/15 1304  WBC 14.1*  HGB 12.6  HCT 39.8  PLT 257  MCV 80.6  MCH 25.4*  MCHC 31.5*  RDW 15.8*  LYMPHSABS 1.8  MONOABS 0.8  EOSABS 0.1  BASOSABS 0.0   ------------------------------------------------------------------------------------------------------------------  Chemistries   Recent Labs Lab 09/06/15 1304  NA 141  K 2.7*  CL 99*  CO2 32  GLUCOSE 93  BUN 11  CREATININE 0.88  CALCIUM 9.0  AST 22  ALT 14  ALKPHOS 61  BILITOT 0.7   ------------------------------------------------------------------------------------------------------------------  Cardiac Enzymes No results for input(s): TROPONINI in the last 168 hours. ------------------------------------------------------------------------------------------------------------------  RADIOLOGY: Ct Abdomen Pelvis W Contrast  09/06/2015   CLINICAL DATA:  74 year old female with generalized abdominal pain and low back pain. Diarrhea.  EXAM: CT ABDOMEN AND PELVIS WITH CONTRAST  TECHNIQUE: Multidetector CT imaging of the abdomen and pelvis was performed using the standard protocol following bolus administration of intravenous contrast.  CONTRAST:  177mL OMNIPAQUE IOHEXOL 350 MG/ML SOLN  COMPARISON:  None.  FINDINGS: Lower chest:  No pleural effusion.  The lung bases are clear.  Hepatobiliary: Large cyst in the left  lobe of liver is identified measuring 4.4 cm, image number 6 of series 2. Small low attenuation structure within the posterior right hepatic lobe is identified which is too small to characterize measuring 5 mm. No suspicious liver abnormalities noted. The gallbladder is normal. No biliary dilatation.  Pancreas: Negative  Spleen: Negative  Adrenals/Urinary Tract: The adrenal glands are normal. Cyst arising from the upper pole of left kidney measures 2.7 cm. Upper pole right renal cyst measures 1.8 cm. The urinary bladder appears within normal limits.  Stomach/Bowel: The stomach is within normal limits. The small bowel loops have a normal course and caliber. No obstruction. There is mild wall thickening involving the mid to distal small bowel loops. Single wall thickness measures up to 8 mm, image 45/series 8. No surrounding fat stranding. There is hyperemia of the Vasa recta. Numerous distal colonic diverticula identified. No evidence for acute diverticulitis.  Vascular/Lymphatic: Normal appearance of the abdominal aorta. No enlarged retroperitoneal or mesenteric adenopathy. No enlarged pelvic or inguinal lymph nodes.  Reproductive: The patient is status post hysterectomy. No adnexal mass.  Other: No free fluid or fluid collections. No free intraperitoneal air. Small umbilical hernia contains fat only.  Musculoskeletal: Multi level degenerative disc disease noted within the lumbar spine.  IMPRESSION: 1. There is mild diffuse wall thickening involving the mid and distal small bowel loops compatible with enteritis. This may be inflammatory or infectious in etiology. No complicating features identified. Specifically no evidence for penetrating disease, bowel obstruction, or  abscess formation. 2. Distal colonic diverticula without acute diverticulitis. 3. Liver cyst.   Electronically Signed   By: Kerby Moors M.D.   On: 09/06/2015 17:18    EKG: Orders placed or performed during the hospital encounter of 09/06/15   . ED EKG  . ED EKG  . EKG 12-Lead  . EKG 12-Lead   EKG a sensitive September 2016 showed sinus rhythm with fusion complexes and 6070s per minute, left axis deviation, left anterior fascicular block, nonspecific ST-T changes  IMPRESSION AND PLAN:  Principal Problem:   Diarrhea Active Problems:   Hypokalemia   Acute cystitis without hematuria 1. Diarrhea, questionable infectious in etiology. Admit patient to medical floor. Initiate IV fluids, clear liquid diet and get stool cultures including C. Difficile 2. Hypokalemia, supplement drink IV, check magnesium level and supplement IV as needed 3. Acute cystitis without hematuria, initiate patient on Rocephin and get urine cultures. Adjust antibiotics depending on culture results 4. Leukocytosis, likely due to infection. Follow with antibiotic therapy.     All the records are reviewed and case discussed with ED provider. Management plans discussed with the patient, family and they are in agreement.  CODE STATUS: Full code   TOTAL TIME TAKING CARE OF THIS PATIENT: 50  minutes.    Theodoro Grist M.D on 09/06/2015 at 6:29 PM  Between 7am to 6pm - Pager - [phone] After 6pm go to [email or web address] - password [email or web address] [facility]  [facility]  Office  [phone]  CC: Primary care physician; Christie Nottingham., PA

## 2015-09-06 NOTE — ED Provider Notes (Signed)
[facility] Emergency Department Provider Note  ____________________________________________  Time seen: Approximately 3:54 PM  I have reviewed the triage vital signs and the nursing notes.   HISTORY  Chief Complaint Diarrhea    HPI Kristy Sellers is a 74 y.o. female with history of hypertension who presents with 48 hours of copious diarrhea, lower abdominal soreness, gradual onset, constant since onset. No blood in her stool, no dark tarry stools. No fevers. No chest pain or difficulty breathing. She has been nauseated without vomiting. She was seen at urgent care for the symptoms just prior to arrival. She was found be hypokalemic with potassium 2.7 and nonspecific EKG changes, she had leukocytosis as well as orthostatic hypotension and was sent to the emergency department for further evaluation. She was also found to have urinary tract infection. Current severity of symptoms is moderate. No modifying factors.   Past Medical History  Diagnosis Date  . Hypertension   . History of detached retina repair     Patient Active Problem List   Diagnosis Date Noted  . Diarrhea 09/06/2015  . Hypokalemia 09/06/2015  . Acute cystitis without hematuria 09/06/2015    Past Surgical History  Procedure Laterality Date  . Abdominal hysterectomy      Current Outpatient Rx  Name  Route  Sig  Dispense  Refill  . amLODipine (NORVASC) 10 MG tablet   Oral   Take 10 mg by mouth daily.         Marland Kitchen HOMATROPAIRE 5 % ophthalmic solution   Ophthalmic   Apply 1 drop to eye 4 (four) times daily. Place in right eye           Dispense as written.   Marland Kitchen losartan-hydrochlorothiazide (HYZAAR) 100-25 MG per tablet   Oral   Take 1 tablet by mouth daily.         . simvastatin (ZOCOR) 10 MG tablet   Oral   Take 10 mg by mouth at bedtime.           Allergies Review of patient's allergies indicates no known allergies.  History reviewed. No pertinent family  history.  Social History Social History  Substance Use Topics  . Smoking status: Former Research scientist (life sciences)  . Smokeless tobacco: None  . Alcohol Use: No    Review of Systems Constitutional: No fever/chills Eyes: No visual changes. ENT: No sore throat. Cardiovascular: Denies chest pain. Respiratory: Denies shortness of breath. Gastrointestinal: + abdominal pain.  + nausea, no vomiting.  + diarrhea.  No constipation. Genitourinary: Negative for dysuria. Musculoskeletal: Negative for back pain. Skin: Negative for rash. Neurological: Negative for headaches, focal weakness or numbness.  10-point ROS otherwise negative.  ____________________________________________   PHYSICAL EXAM: Filed Vitals:   09/06/15 1557  BP: 123/66  Pulse: 96  Temp: 98.1 F (36.7 C)  TempSrc: Oral  Height: 5\' 5"  (1.651 m)  Weight: 165 lb (74.844 kg)  SpO2: 100%      Constitutional: Alert and oriented. Well appearing and in no acute distress. Eyes: Conjunctivae are normal. PERRL. EOMI. Head: Atraumatic. Nose: No congestion/rhinnorhea. Mouth/Throat: Mucous membranes are moist.  Oropharynx non-erythematous. Neck: No stridor.   Cardiovascular: Normal rate, regular rhythm. Grossly normal heart sounds.  Good peripheral circulation. Respiratory: Normal respiratory effort.  No retractions. Lungs CTAB. Gastrointestinal: Soft with mild lower abdominal tenderness. No distention. No abdominal bruits. No CVA tenderness. Genitourinary: deferred Musculoskeletal: No lower extremity tenderness nor edema.  No joint effusions. Neurologic:  Normal speech and language. No gross focal neurologic deficits  are appreciated. No gait instability. Skin:  Skin is warm, dry and intact. No rash noted. Psychiatric: Mood and affect are normal. Speech and behavior are normal.  ____________________________________________   LABS (all labs ordered are listed, but only abnormal results are displayed)  Labs Reviewed  CULTURE, BLOOD  (ROUTINE X 2)  CULTURE, BLOOD (ROUTINE X 2)  MAGNESIUM   ____________________________________________  EKG  ED ECG REPORT I, Joanne Gavel, the attending physician, personally viewed and interpreted this ECG.   Date: 09/06/2015  EKG Time: 16:20  Rate: 67  Rhythm: sinus rhythm with fusion  Axis: normal  Intervals:none  ST&T Change: T wave flattening in aVF, V3, V4, V5, V6, T-wave inversions in V1, V2  ____________________________________________  RADIOLOGY  CT abdomen and pelvis  IMPRESSION: 1. There is mild diffuse wall thickening involving the mid and distal small bowel loops compatible with enteritis. This may be inflammatory or infectious in etiology. No complicating features identified. Specifically no evidence for penetrating disease, bowel obstruction, or abscess formation. 2. Distal colonic diverticula without acute diverticulitis. 3. Liver cyst.  ____________________________________________   PROCEDURES  Procedure(s) performed: None  Critical Care performed: No  ____________________________________________   INITIAL IMPRESSION / ASSESSMENT AND PLAN / ED COURSE  Pertinent labs & imaging results that were available during my care of the patient were reviewed by me and considered in my medical decision making (see chart for details).  Kristy Sellers is a 74 y.o. female with history of hypertension who presents with 48 hours of copious diarrhea, lower abdominal soreness, gradual onset, constant since onset. On exam, she is nontoxic appearing and in no acute distress. Vital signs stable, she is afebrile. She has diffuse mild lower abdominal tenderness. Labs at urgent care reviewed by me. Potassium 2.7, we'll replete. She has leukocytosis of 14,000, we'll obtain CT of the abdomen and pelvis, continue IV fluids, anticipate admission. We'll give ceftriaxone for possible urinary tract infection. Urine culture pending.  ----------------------------------------- 6:11  PM on 09/06/2015 -----------------------------------------  CT scan with enteritis, not diverticulitis, obstruction, no abscess. Blood pressure improving. Case discussed with hospitalist for admission at this time. ____________________________________________   FINAL CLINICAL IMPRESSION(S) / ED DIAGNOSES  Final diagnoses:  Diarrhea  UTI (lower urinary tract infection)  Acute hypokalemia      Joanne Gavel, MD 09/06/15 1811

## 2015-09-06 NOTE — ED Notes (Addendum)
Pt started to c/o about the potassium burnig, Dr Edd Fabian gave verbal to hang NS with it. Potassium rate reduce to 99ml/hr

## 2015-09-06 NOTE — ED Notes (Signed)
Pt here from [place]. With abd pain and low potassium. Pt states that she has had diarrhea for the past several days. Pt states that she usually has constipation and that she has been going 10-15 times a day.

## 2015-09-06 NOTE — ED Notes (Signed)
Pt ems for potassium 2.7 and diarrhea increasing in frequency since saturday

## 2015-09-06 NOTE — Plan of Care (Signed)
Problem: Discharge Progression Outcomes Goal: Discharge plan in place and appropriate Outcome: Progressing Individualization: 1. Lives at home with husband who is under treatment for multiple myeloma/diabetes. 2. 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 3. High fall precautions: bed alarm, protective footwear, fall risk signage/education, "call,don't fall.", offer toileting every hour WA, keep personal items in hands reach, approach/communicate to pt in left visual field. 4. PMH- HTN controlled by meds.

## 2015-09-06 NOTE — ED Notes (Signed)
Pt ambulated to the nurses station, stating that she wanted a hat and needed to have a BM.  A hat was provided to the pt.

## 2015-09-06 NOTE — ED Notes (Signed)
When i walked into the room pt was cleaning herself up due to having diarrhea and messed on herself.assisted pt

## 2015-09-07 LAB — BASIC METABOLIC PANEL
Anion gap: 10 (ref 5–15)
BUN: 11 mg/dL (ref 6–20)
CALCIUM: 8.1 mg/dL — AB (ref 8.9–10.3)
CO2: 25 mmol/L (ref 22–32)
CREATININE: 0.86 mg/dL (ref 0.44–1.00)
Chloride: 107 mmol/L (ref 101–111)
GFR calc Af Amer: >60 mL/min (ref >60)
GLUCOSE: 62 mg/dL — AB (ref 65–99)
Potassium: 3.1 mmol/L — ABNORMAL LOW (ref 3.5–5.1)
SODIUM: 142 mmol/L (ref 135–145)

## 2015-09-07 LAB — MAGNESIUM
MAGNESIUM: 1.9 mg/dL (ref 1.7–2.4)
Magnesium: 1.9 mg/dL (ref 1.7–2.4)

## 2015-09-07 MED ORDER — LOSARTAN POTASSIUM 50 MG PO TABS
100.0000 mg | ORAL_TABLET | Freq: Every day | ORAL | Status: DC
  Administered 2015-09-07: 10:00:00 100 mg via ORAL
  Filled 2015-09-07 (×2): qty 2

## 2015-09-07 MED ORDER — HOMATROPINE HBR 5 % OP SOLN
1.0000 [drp] | Freq: Two times a day (BID) | OPHTHALMIC | Status: DC
  Administered 2015-09-07 – 2015-09-08 (×2): 1 [drp] via OPHTHALMIC
  Filled 2015-09-07: qty 5

## 2015-09-07 MED ORDER — PREDNISOLONE ACETATE 1 % OP SUSP
1.0000 [drp] | Freq: Four times a day (QID) | OPHTHALMIC | Status: DC
  Administered 2015-09-07 – 2015-09-08 (×3): 1 [drp] via OPHTHALMIC
  Filled 2015-09-07: qty 1

## 2015-09-07 MED ORDER — HOMATROPINE HBR 5 % OP SOLN
1.0000 [drp] | Freq: Four times a day (QID) | OPHTHALMIC | Status: DC
  Filled 2015-09-07: qty 5

## 2015-09-07 MED ORDER — DEXTROSE 5 % IV SOLN
1.0000 g | INTRAVENOUS | Status: DC
  Administered 2015-09-07: 18:00:00 1 g via INTRAVENOUS
  Filled 2015-09-07 (×2): qty 10

## 2015-09-07 MED ORDER — HYDROCHLOROTHIAZIDE 25 MG PO TABS
25.0000 mg | ORAL_TABLET | Freq: Every day | ORAL | Status: DC
  Administered 2015-09-07: 25 mg via ORAL
  Filled 2015-09-07 (×2): qty 1

## 2015-09-07 NOTE — Progress Notes (Signed)
[place] at [place] NAME: Kristy Sellers    MR#:  [id number]  DATE OF BIRTH:  22-Mar-1941  SUBJECTIVE:   diarrhea slowly improving.  REVIEW OF SYSTEMS:    Review of Systems  Constitutional: Negative for fever, chills and malaise/fatigue.  HENT: Negative for sore throat.   Eyes: Negative for blurred vision.  Respiratory: Negative for cough, hemoptysis, shortness of breath and wheezing.   Cardiovascular: Negative for chest pain, palpitations and leg swelling.  Gastrointestinal: Positive for diarrhea. Negative for nausea, vomiting, abdominal pain and blood in stool.  Genitourinary: Negative for dysuria.  Musculoskeletal: Negative for back pain.  Neurological: Negative for dizziness, tremors and headaches.  Endo/Heme/Allergies: Does not bruise/bleed easily.    Tolerating Diet:yes      DRUG ALLERGIES:  No Known Allergies  VITALS:  Blood pressure 118/59, pulse 82, temperature 98.3 F (36.8 C), temperature source Oral, resp. rate 18, height 5\' 5"  (1.651 m), weight 80.65 kg (177 lb 12.8 oz), SpO2 94 %.  PHYSICAL EXAMINATION:   Physical Exam  Constitutional: She is oriented to person, place, and time and well-developed, well-nourished, and in no distress. No distress.  HENT:  Head: Normocephalic.  Eyes: No scleral icterus.  Neck: Normal range of motion. Neck supple. No JVD present. No tracheal deviation present.  Cardiovascular: Normal rate, regular rhythm and normal heart sounds.  Exam reveals no gallop and no friction rub.   No murmur heard. Pulmonary/Chest: Effort normal and breath sounds normal. No respiratory distress. She has no wheezes. She has no rales. She exhibits no tenderness.  Abdominal: Soft. Bowel sounds are normal. She exhibits no distension and no mass. There is no tenderness. There is no rebound and no guarding.  Musculoskeletal: Normal range of motion. She exhibits no edema.  Neurological: She is alert and  oriented to person, place, and time.  Skin: Skin is warm. No rash noted. No erythema.  Psychiatric: Affect and judgment normal.      LABORATORY PANEL:   CBC  Recent Labs Lab 09/06/15 2134  WBC 13.8*  HGB 11.5*  HCT 36.2  PLT 238   ------------------------------------------------------------------------------------------------------------------  Chemistries   Recent Labs Lab 09/06/15 1304  09/07/15 0740  NA 141  --  142  K 2.7*  --  3.1*  CL 99*  --  107  CO2 32  --  25  GLUCOSE 93  --  62*  BUN 11  --  11  CREATININE 0.88  < > 0.86  CALCIUM 9.0  --  8.1*  MG  --   < > 1.9  AST 22  --   --   ALT 14  --   --   ALKPHOS 61  --   --   BILITOT 0.7  --   --   < > = values in this interval not displayed. ------------------------------------------------------------------------------------------------------------------  Cardiac Enzymes No results for input(s): TROPONINI in the last 168 hours. ------------------------------------------------------------------------------------------------------------------  RADIOLOGY:  Ct Abdomen Pelvis W Contrast  IMPRESSION: 1. There is mild diffuse wall thickening involving the mid and distal small bowel loops compatible with enteritis. This may be inflammatory or infectious in etiology. No complicating features identified. Specifically no evidence for penetrating disease, bowel obstruction, or abscess formation. 2. Distal colonic diverticula without acute diverticulitis. 3. Liver cyst.   Electronically Signed   By: Kerby Moors M.D.   On: 09/06/2015 17:18     ASSESSMENT AND PLAN:    74 year old female with a history of  hypertension who presents with diarrhea and found to have C. Difficile enteritis.   1. C. Difficile enteritis: patient is started on Flagyl and will continue this for treatment of 14 days. Patient's diarrhea slowly resolving. Patient will need to be monitored for 24 hours. If her diarrhea significantly improved she  may be able to be discharged in a.m.   2. Unit tract infection without hematuria: Continue Rocephin. Follow-up on urine culture.   3. Hypokalemia: This was repleted.   4. Leukocytosis: This is secondary to problem #1 and 2. CBC for the a.m.   5. Essential hypertension: Continue Norvasc  And Cozaar.   6. Hyperlipidemia: continue Zocor     Management plans discussed with the patient and she is in agreement.  CODE STATUS: full  TOTAL TIME TAKING CARE OF THIS PATIENT: 30 minutes.     POSSIBLE D/C tomorrow, DEPENDING ON CLINICAL CONDITION.   Nakiea Metzner M.D on 09/07/2015 at 11:40 AM  Between 7am to 6pm - Pager - [phone] After 6pm go to [email or web address] - password [email or web address] [facility]  [facility]  Office  [phone]  CC: Primary care physician; Christie Nottingham., PA

## 2015-09-07 NOTE — Plan of Care (Signed)
Problem: Discharge Progression Outcomes Goal: Other Discharge Outcomes/Goals Outcome: Progressing Plan of care progress to goal for: 1. Pain-no c/o pain this shift 2. Hemodynamically-             -VSS, pt remains afebrile this shift             -Oral & IV ABT continues per orders 3. Complications-no evidence of this shift 4. Diet-pt tolerating soft diet this shift 5. Activity-pt is up in room unassisted

## 2015-09-07 NOTE — Care Management (Signed)
Admitted to [facility] with the diagnosis of diarrhea. Lives with husband, Herbie [place] [phone]). Last seen Dr. Radford Pax 3 months ago, will seen again 09/19/15. No home Health. No skilled facility. No home oxygen. Uses no aides for ambulation. Takes care of both basic and instrumental activities of daily living, still drives. Good appetite.  Accidentally fell over her granddaughter. Husband will transport. Shelbie Ammons RN MSN Care Management [phone]

## 2015-09-07 NOTE — Plan of Care (Signed)
Problem: Discharge Progression Outcomes Goal: Discharge plan in place and appropriate Individualization Outcome: Progressing Individualization: 1. Lives at home with husband who is under treatment for multiple myeloma/diabetes. 2. 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 3. High fall precautions: bed alarm, protective footwear, fall risk signage/education, "call,don't fall.", offer toileting every hour WA, keep personal items in hands reach, approach/communicate to pt in left visual field. 4. PMH- HTN controlled by meds.    Goal: Other Discharge Outcomes/Goals Outcome: Progressing Plan of care progress to goal: Pain - pt complains of no pain VSS Complications - pt positive for c.diff., explanation given to pt Diet - pt on clear liquids Activity - pt does well walking, but she did have a fall at home a month ago (tripped over a grandchild)

## 2015-09-07 NOTE — Progress Notes (Signed)
Spoke with Dr. Benjie Karvonen about pt's request to restart her home BP medication Hyzaar.  Losartan and hydrochlorthiazide ordered separate per pharmacy as we don't have Hyzaar.  MD ok with restarting this medication.  Clarise Cruz, RN

## 2015-09-07 NOTE — Care Management Important Message (Signed)
Important Message  Patient Details  Name: Kristy Sellers MRN: [id number] Date of Birth: [birth year]   Medicare Important Message Given:  Yes-second notification given    Shelbie Ammons, RN 09/07/2015, 8:36 AM

## 2015-09-08 LAB — POTASSIUM: Potassium: 4.5 mmol/L (ref 3.5–5.1)

## 2015-09-08 LAB — BASIC METABOLIC PANEL
Anion gap: 6 (ref 5–15)
BUN: 11 mg/dL (ref 6–20)
CALCIUM: 8.3 mg/dL — AB (ref 8.9–10.3)
CO2: 29 mmol/L (ref 22–32)
Chloride: 107 mmol/L (ref 101–111)
Creatinine, Ser: 0.83 mg/dL (ref 0.44–1.00)
GFR calc Af Amer: >60 mL/min (ref >60)
GLUCOSE: 87 mg/dL (ref 65–99)
Potassium: 2.9 mmol/L — CL (ref 3.5–5.1)
Sodium: 142 mmol/L (ref 135–145)

## 2015-09-08 LAB — CBC
HEMATOCRIT: 33.4 % — AB (ref 35.0–47.0)
Hemoglobin: 10.6 g/dL — ABNORMAL LOW (ref 12.0–16.0)
MCH: 25.8 pg — AB (ref 26.0–34.0)
MCHC: 31.8 g/dL — AB (ref 32.0–36.0)
MCV: 81 fL (ref 80.0–100.0)
PLATELETS: 209 10*3/uL (ref 150–440)
RBC: 4.13 MIL/uL (ref 3.80–5.20)
RDW: 15.7 % — AB (ref 11.5–14.5)
WBC: 8.6 10*3/uL (ref 3.6–11.0)

## 2015-09-08 LAB — URINE CULTURE: SPECIAL REQUESTS: NORMAL

## 2015-09-08 LAB — MAGNESIUM: Magnesium: 2 mg/dL (ref 1.7–2.4)

## 2015-09-08 MED ORDER — POTASSIUM CHLORIDE 20 MEQ/15ML (10%) PO SOLN
40.0000 meq | Freq: Once | ORAL | Status: AC
  Administered 2015-09-08: 40 meq via ORAL
  Filled 2015-09-08: qty 30

## 2015-09-08 MED ORDER — MAGNESIUM SULFATE IN D5W 10-5 MG/ML-% IV SOLN
1.0000 g | Freq: Once | INTRAVENOUS | Status: DC
  Administered 2015-09-08: 10:00:00 1 g via INTRAVENOUS
  Filled 2015-09-08: qty 100

## 2015-09-08 MED ORDER — METRONIDAZOLE 500 MG PO TABS: 500.0000 mg | ORAL_TABLET | Freq: Three times a day (TID) | ORAL | Status: DC

## 2015-09-08 MED ORDER — CEFUROXIME AXETIL 500 MG PO TABS: 500.0000 mg | ORAL_TABLET | Freq: Two times a day (BID) | ORAL | Status: DC

## 2015-09-08 MED ORDER — ACIDOPHILUS PROBIOTIC BLEND PO CAPS: 1.0000 | ORAL_CAPSULE | Freq: Two times a day (BID) | ORAL | Status: DC

## 2015-09-08 MED ORDER — POTASSIUM CHLORIDE CRYS ER 20 MEQ PO TBCR
40.0000 meq | EXTENDED_RELEASE_TABLET | Freq: Two times a day (BID) | ORAL | Status: DC
  Administered 2015-09-08: 40 meq via ORAL
  Filled 2015-09-08 (×2): qty 2

## 2015-09-08 MED ORDER — POTASSIUM CHLORIDE 10 MEQ/100ML IV SOLN
10.0000 meq | INTRAVENOUS | Status: AC
  Administered 2015-09-08 (×4): 10 meq via INTRAVENOUS
  Filled 2015-09-08 (×4): qty 100

## 2015-09-08 NOTE — Progress Notes (Signed)
Paged MD about potassium level of 4.5. Ok to be discharged.

## 2015-09-08 NOTE — Progress Notes (Addendum)
Pt discharged home per MD order. Discharge instructions given to patient. Eye drops returned. Medicines, follow up care, activity, diet and prescriptions given to patient. All questions answered. Patient verbalized understanding. Pt left via wheelchair with nursing and family.

## 2015-09-08 NOTE — Plan of Care (Signed)
Individualization of Care Pt prefers to be called Kristy Sellers Hx of HTN, R eye blindness due to detached retina  Problem: Discharge Progression Outcomes Goal: Other Discharge Outcomes/Goals Outcome: Progressing  Plan of care progress to goal for: 1. Pain:  Patient had no complaints of pain this shift. 2. Hemodynamically:  Patient afebrile and VSS this shift.  Oral Flagyl given x 2 this shift. BP 105/58 mmHg  Pulse 66  Temp(Src) 99.4 F (37.4 C) (Oral)  Resp 18  Ht 5\' 5"  (1.651 m)  Wt 177 lb 12.8 oz (80.65 kg)  BMI 29.59 kg/m2  SpO2 93%  3.  Complications:  No complications this shift. 4.  Diet:  Patient on soft diet.  Tolerating well this shift. 5.  Activity:  Patient is up in room and to bathroom independently this shift.  Tolerating well.

## 2015-09-08 NOTE — Discharge Summary (Signed)
[place] at [place] NAME: Kristy Sellers    MR#:  [id number]  DATE OF BIRTH:  25-Jan-1941  DATE OF ADMISSION:  09/06/2015 ADMITTING PHYSICIAN: Theodoro Grist, MD  DATE OF DISCHARGE: 09/08/2015  PRIMARY CARE PHYSICIAN: Christie Nottingham., PA    ADMISSION DIAGNOSIS:  Diarrhea [R19.7] Acute hypokalemia [E87.6] UTI (lower urinary tract infection) [N39.0]  DISCHARGE DIAGNOSIS:  Principal Problem:   Diarrhea Active Problems:   Hypokalemia   Acute cystitis without hematuria   SECONDARY DIAGNOSIS:   Past Medical History  Diagnosis Date  . Hypertension   . History of detached retina repair     HOSPITAL COURSE:  This is a very pleasant 74 year old female with a history of hypertension who presented diarrhea and found to have C. difficile enteritis. For further details please refer the H&P.  1. C. difficile enteritis: Patient presented with diarrhea and her C. difficile culture was positive. Patient was started on Flagyl. Patient's diarrhea has subsequently improved. Patient will need treatment for total 14 days. Patient will also be discharged with Flagyl and probiotic.  2. Urinary tract infection: Urine culture is showing no growth. Her urine analysis did appear to have urinary tract infection. She was started on Rocephin and will be discharged on Ceftin.  3. Hypokalemia: Potassium and magnesium were repleted prior to discharge.  4. Essential hypertension: Patient may resume her outpatient medications.   DISCHARGE CONDITIONS AND DIET:  Patient is being discharged to home in stable condition on a heart healthy diet  CONSULTS OBTAINED:     DRUG ALLERGIES:  No Known Allergies  DISCHARGE MEDICATIONS:   Current Discharge Medication List    START taking these medications   Details  cefUROXime (CEFTIN) 500 MG tablet Take 1 tablet (500 mg total) by mouth 2 (two) times daily with a meal. Qty: 10 tablet, Refills: 0     metroNIDAZOLE (FLAGYL) 500 MG tablet Take 1 tablet (500 mg total) by mouth every 8 (eight) hours. Qty: 36 tablet, Refills: 0    Probiotic Product (ACIDOPHILUS PROBIOTIC BLEND) CAPS Take 1 capsule by mouth 2 (two) times daily. Qty: 28 capsule, Refills: 0      CONTINUE these medications which have NOT CHANGED   Details  amLODipine (NORVASC) 10 MG tablet Take 10 mg by mouth daily.    HOMATROPAIRE 5 % ophthalmic solution Apply 1 drop to eye 4 (four) times daily. Place in right eye    losartan-hydrochlorothiazide (HYZAAR) 100-25 MG per tablet Take 1 tablet by mouth daily.    simvastatin (ZOCOR) 10 MG tablet Take 10 mg by mouth at bedtime.              Today   CHIEF COMPLAINT:  Patient is doing well this morning. Patient reports her diarrhea is much improved. Patient denies abdominal pain or nausea. Patient is tolerating her diet.   VITAL SIGNS:  Blood pressure 100/54, pulse 63, temperature 98.4 F (36.9 C), temperature source Oral, resp. rate 16, height 5\' 5"  (1.651 m), weight 80.65 kg (177 lb 12.8 oz), SpO2 93 %.   REVIEW OF SYSTEMS:  Review of Systems  Constitutional: Negative for fever, chills and malaise/fatigue.  HENT: Negative for sore throat.   Eyes: Negative for blurred vision.  Respiratory: Negative for cough, hemoptysis, shortness of breath and wheezing.   Cardiovascular: Negative for chest pain, palpitations and leg swelling.  Gastrointestinal: Negative for nausea, vomiting, abdominal pain, diarrhea and blood in stool.  Genitourinary: Negative for dysuria.  Musculoskeletal: Negative for back pain.  Neurological: Negative for dizziness, tremors and headaches.  Endo/Heme/Allergies: Does not bruise/bleed easily.     PHYSICAL EXAMINATION:  GENERAL:  74 y.o.-year-old patient lying in the bed with no acute distress.  NECK:  Supple, no jugular venous distention. No thyroid enlargement, no tenderness.  LUNGS: Normal breath sounds bilaterally, no wheezing,  rales,rhonchi  No use of accessory muscles of respiration.  CARDIOVASCULAR: S1, S2 normal. No murmurs, rubs, or gallops.  ABDOMEN: Soft, non-tender, non-distended. Bowel sounds present. No organomegaly or mass.  EXTREMITIES: No pedal edema, cyanosis, or clubbing.  PSYCHIATRIC: The patient is alert and oriented x 3.  SKIN: No obvious rash, lesion, or ulcer.   DATA REVIEW:   CBC  Recent Labs Lab 09/08/15 0428  WBC 8.6  HGB 10.6*  HCT 33.4*  PLT 209    Chemistries   Recent Labs Lab 09/06/15 1304  09/08/15 0428  NA 141  < > 142  K 2.7*  < > 2.9*  CL 99*  < > 107  CO2 32  < > 29  GLUCOSE 93  < > 87  BUN 11  < > 11  CREATININE 0.88  < > 0.83  CALCIUM 9.0  < > 8.3*  MG  --   < > 2.0  AST 22  --   --   ALT 14  --   --   ALKPHOS 61  --   --   BILITOT 0.7  --   --   < > = values in this interval not displayed.  Cardiac Enzymes No results for input(s): TROPONINI in the last 168 hours.  Microbiology Results  @[email or web address] @  RADIOLOGY:  Ct Abdomen Pelvis W Contrast  IMPRESSION: 1. There is mild diffuse wall thickening involving the mid and distal small bowel loops compatible with enteritis. This may be inflammatory or infectious in etiology. No complicating features identified. Specifically no evidence for penetrating disease, bowel obstruction, or abscess formation. 2. Distal colonic diverticula without acute diverticulitis. 3. Liver cyst.   Electronically Signed   By: Kerby Moors M.D.   On: 09/06/2015 17:18      Management plans discussed with the patient and she is in agreement. Stable for discharge home  Patient should follow up with PCP in 1 week  CODE STATUS:     Code Status Orders        Start     Ordered   09/06/15 2029  Full code   Continuous     09/06/15 2028      TOTAL TIME TAKING CARE OF THIS PATIENT: 35 minutes.    Etoy Mcdonnell M.D on 09/08/2015 at 10:56 AM  Between 7am to 6pm - Pager - [phone] After 6pm go to [email or web address] -  password [email or web address] [facility]  [facility]  Office  [phone]  CC: Primary care physician; Christie Nottingham., PA

## 2015-09-08 NOTE — Progress Notes (Signed)
Patient had a critical lab of 2.9 for Potassium.  Paged Dr. Marcille Blanco who put in orders for 40 mg of K-Dur and 4 100 mL bags of Potassium IVPB.

## 2015-09-10 LAB — STOOL CULTURE: Special Requests: NORMAL

## 2015-09-11 DIAGNOSIS — D500 Iron deficiency anemia secondary to blood loss (chronic): Secondary | ICD-10-CM | POA: Diagnosis not present

## 2015-09-11 DIAGNOSIS — I10 Essential (primary) hypertension: Secondary | ICD-10-CM | POA: Diagnosis not present

## 2015-09-11 DIAGNOSIS — R197 Diarrhea, unspecified: Secondary | ICD-10-CM | POA: Diagnosis not present

## 2015-09-11 DIAGNOSIS — K529 Noninfective gastroenteritis and colitis, unspecified: Secondary | ICD-10-CM | POA: Diagnosis not present

## 2015-09-11 DIAGNOSIS — Z634 Disappearance and death of family member: Secondary | ICD-10-CM | POA: Diagnosis not present

## 2015-09-11 DIAGNOSIS — E876 Hypokalemia: Secondary | ICD-10-CM | POA: Diagnosis not present

## 2015-09-11 DIAGNOSIS — N390 Urinary tract infection, site not specified: Secondary | ICD-10-CM | POA: Diagnosis not present

## 2015-09-11 LAB — CULTURE, BLOOD (ROUTINE X 2)
CULTURE: NO GROWTH
Culture: NO GROWTH
Special Requests: NORMAL

## 2015-09-21 ENCOUNTER — Other Ambulatory Visit
Admission: RE | Admit: 2015-09-21 | Discharge: 2015-09-21 | Disposition: A | Discharge status: Home / Self Care | Payer: Medicare Other | Source: Ambulatory Visit | Attending: Physician Assistant | Admitting: Physician Assistant

## 2015-09-21 DIAGNOSIS — N3090 Cystitis, unspecified without hematuria: Secondary | ICD-10-CM | POA: Diagnosis not present

## 2015-09-21 DIAGNOSIS — K5792 Diverticulitis of intestine, part unspecified, without perforation or abscess without bleeding: Secondary | ICD-10-CM | POA: Insufficient documentation

## 2015-09-21 DIAGNOSIS — D509 Iron deficiency anemia, unspecified: Secondary | ICD-10-CM | POA: Diagnosis not present

## 2015-09-21 LAB — CBC WITH DIFFERENTIAL/PLATELET
BASOS ABS: 0 10*3/uL (ref 0–0.1)
Basophils Relative: 1 %
EOS PCT: 2 %
Eosinophils Absolute: 0.2 10*3/uL (ref 0–0.7)
HCT: 35.8 % (ref 35.0–47.0)
Hemoglobin: 11.2 g/dL — ABNORMAL LOW (ref 12.0–16.0)
LYMPHS PCT: 20 %
Lymphs Abs: 1.7 10*3/uL (ref 1.0–3.6)
MCH: 25.2 pg — AB (ref 26.0–34.0)
MCHC: 31.2 g/dL — AB (ref 32.0–36.0)
MCV: 80.8 fL (ref 80.0–100.0)
MONO ABS: 0.6 10*3/uL (ref 0.2–0.9)
MONOS PCT: 7 %
Neutro Abs: 6.2 10*3/uL (ref 1.4–6.5)
Neutrophils Relative %: 70 %
PLATELETS: 279 10*3/uL (ref 150–440)
RBC: 4.43 MIL/uL (ref 3.80–5.20)
RDW: 16.9 % — AB (ref 11.5–14.5)
WBC: 8.8 10*3/uL (ref 3.6–11.0)

## 2015-09-21 LAB — COMPREHENSIVE METABOLIC PANEL
ALBUMIN: 3.6 g/dL (ref 3.5–5.0)
ALK PHOS: 41 U/L (ref 38–126)
ALT: 16 U/L (ref 14–54)
AST: 18 U/L (ref 15–41)
Anion gap: 6 (ref 5–15)
BILIRUBIN TOTAL: 0.6 mg/dL (ref 0.3–1.2)
BUN: 11 mg/dL (ref 6–20)
CO2: 28 mmol/L (ref 22–32)
Calcium: 8.7 mg/dL — ABNORMAL LOW (ref 8.9–10.3)
Chloride: 108 mmol/L (ref 101–111)
Creatinine, Ser: 0.8 mg/dL (ref 0.44–1.00)
GFR calc Af Amer: >60 mL/min (ref >60)
GFR calc non Af Amer: >60 mL/min (ref >60)
GLUCOSE: 88 mg/dL (ref 65–99)
POTASSIUM: 3.4 mmol/L — AB (ref 3.5–5.1)
Sodium: 142 mmol/L (ref 135–145)
TOTAL PROTEIN: 6.7 g/dL (ref 6.5–8.1)

## 2015-09-21 LAB — IRON AND TIBC
Iron: 54 ug/dL (ref 28–170)
SATURATION RATIOS: 24 % (ref 10.4–31.8)
TIBC: 229 ug/dL — ABNORMAL LOW (ref 250–450)
UIBC: 176 ug/dL

## 2015-09-21 LAB — URINALYSIS COMPLETE WITH MICROSCOPIC (ARMC ONLY)
BACTERIA UA: NONE SEEN
Bilirubin Urine: NEGATIVE
Glucose, UA: NEGATIVE mg/dL
HGB URINE DIPSTICK: NEGATIVE
Ketones, ur: NEGATIVE mg/dL
NITRITE: NEGATIVE
PH: 7 (ref 5.0–8.0)
PROTEIN: NEGATIVE mg/dL
SPECIFIC GRAVITY, URINE: 1.011 (ref 1.005–1.030)

## 2015-09-21 LAB — FERRITIN: Ferritin: 56 ng/mL (ref 11–307)

## 2015-09-23 LAB — URINE CULTURE: CULTURE: NO GROWTH

## 2015-09-27 DIAGNOSIS — Z23 Encounter for immunization: Secondary | ICD-10-CM | POA: Diagnosis not present

## 2015-09-27 DIAGNOSIS — R197 Diarrhea, unspecified: Secondary | ICD-10-CM | POA: Diagnosis not present

## 2015-09-27 DIAGNOSIS — D500 Iron deficiency anemia secondary to blood loss (chronic): Secondary | ICD-10-CM | POA: Diagnosis not present

## 2015-09-27 DIAGNOSIS — I10 Essential (primary) hypertension: Secondary | ICD-10-CM | POA: Diagnosis not present

## 2015-10-05 LAB — URINE CULTURE: SPECIAL REQUESTS: NORMAL

## 2015-10-19 DIAGNOSIS — H44112 Panuveitis, left eye: Secondary | ICD-10-CM | POA: Diagnosis not present

## 2015-10-19 DIAGNOSIS — H348322 Tributary (branch) retinal vein occlusion, left eye, stable: Secondary | ICD-10-CM | POA: Diagnosis not present

## 2015-10-19 DIAGNOSIS — H269 Unspecified cataract: Secondary | ICD-10-CM | POA: Diagnosis not present

## 2015-10-19 DIAGNOSIS — H40052 Ocular hypertension, left eye: Secondary | ICD-10-CM | POA: Diagnosis not present

## 2015-10-19 DIAGNOSIS — H209 Unspecified iridocyclitis: Secondary | ICD-10-CM | POA: Diagnosis not present

## 2015-11-14 DIAGNOSIS — H6123 Impacted cerumen, bilateral: Secondary | ICD-10-CM | POA: Diagnosis not present

## 2015-11-14 DIAGNOSIS — H9202 Otalgia, left ear: Secondary | ICD-10-CM | POA: Diagnosis not present

## 2015-11-29 DIAGNOSIS — H93293 Other abnormal auditory perceptions, bilateral: Secondary | ICD-10-CM | POA: Diagnosis not present

## 2015-11-29 DIAGNOSIS — H6123 Impacted cerumen, bilateral: Secondary | ICD-10-CM | POA: Diagnosis not present

## 2016-01-03 DIAGNOSIS — R7301 Impaired fasting glucose: Secondary | ICD-10-CM | POA: Diagnosis not present

## 2016-01-03 DIAGNOSIS — I10 Essential (primary) hypertension: Secondary | ICD-10-CM | POA: Diagnosis not present

## 2016-01-03 DIAGNOSIS — D500 Iron deficiency anemia secondary to blood loss (chronic): Secondary | ICD-10-CM | POA: Diagnosis not present

## 2016-01-03 DIAGNOSIS — M542 Cervicalgia: Secondary | ICD-10-CM | POA: Diagnosis not present

## 2016-01-03 DIAGNOSIS — M25512 Pain in left shoulder: Secondary | ICD-10-CM | POA: Diagnosis not present

## 2016-01-03 DIAGNOSIS — H6123 Impacted cerumen, bilateral: Secondary | ICD-10-CM | POA: Diagnosis not present

## 2016-01-04 DIAGNOSIS — H44112 Panuveitis, left eye: Secondary | ICD-10-CM | POA: Diagnosis not present

## 2016-01-04 DIAGNOSIS — H40022 Open angle with borderline findings, high risk, left eye: Secondary | ICD-10-CM | POA: Diagnosis not present

## 2016-02-08 DIAGNOSIS — H44112 Panuveitis, left eye: Secondary | ICD-10-CM | POA: Diagnosis not present

## 2016-02-08 DIAGNOSIS — H3321 Serous retinal detachment, right eye: Secondary | ICD-10-CM | POA: Diagnosis not present

## 2016-05-18 ENCOUNTER — Other Ambulatory Visit: Payer: Self-pay | Admitting: Internal Medicine

## 2016-05-18 ENCOUNTER — Encounter: Payer: Self-pay | Admitting: Internal Medicine

## 2016-05-18 DIAGNOSIS — H3320 Serous retinal detachment, unspecified eye: Secondary | ICD-10-CM | POA: Insufficient documentation

## 2016-05-18 DIAGNOSIS — Z961 Presence of intraocular lens: Secondary | ICD-10-CM | POA: Insufficient documentation

## 2016-05-18 DIAGNOSIS — H40059 Ocular hypertension, unspecified eye: Secondary | ICD-10-CM | POA: Insufficient documentation

## 2016-05-18 DIAGNOSIS — H44119 Panuveitis, unspecified eye: Secondary | ICD-10-CM | POA: Insufficient documentation

## 2016-05-18 DIAGNOSIS — H269 Unspecified cataract: Secondary | ICD-10-CM | POA: Insufficient documentation

## 2016-05-18 HISTORY — DX: Serous retinal detachment, unspecified eye: H33.20

## 2016-06-11 ENCOUNTER — Ambulatory Visit (INDEPENDENT_AMBULATORY_CARE_PROVIDER_SITE_OTHER): Payer: Medicare Other | Admitting: Internal Medicine

## 2016-06-11 ENCOUNTER — Encounter: Payer: Self-pay | Admitting: Internal Medicine

## 2016-06-11 VITALS — BP 118/80 | HR 80 | Ht 65.0 in | Wt 180.0 lb

## 2016-06-11 DIAGNOSIS — Z8719 Personal history of other diseases of the digestive system: Secondary | ICD-10-CM

## 2016-06-11 DIAGNOSIS — I10 Essential (primary) hypertension: Secondary | ICD-10-CM | POA: Diagnosis not present

## 2016-06-11 DIAGNOSIS — Z8619 Personal history of other infectious and parasitic diseases: Secondary | ICD-10-CM | POA: Insufficient documentation

## 2016-06-11 DIAGNOSIS — H5440 Blindness, one eye, unspecified eye: Secondary | ICD-10-CM | POA: Insufficient documentation

## 2016-06-11 DIAGNOSIS — H5441 Blindness, right eye, normal vision left eye: Secondary | ICD-10-CM

## 2016-06-11 DIAGNOSIS — R7611 Nonspecific reaction to tuberculin skin test without active tuberculosis: Secondary | ICD-10-CM

## 2016-06-11 DIAGNOSIS — E785 Hyperlipidemia, unspecified: Secondary | ICD-10-CM | POA: Diagnosis not present

## 2016-06-11 DIAGNOSIS — Z1231 Encounter for screening mammogram for malignant neoplasm of breast: Secondary | ICD-10-CM

## 2016-06-11 DIAGNOSIS — Z9289 Personal history of other medical treatment: Secondary | ICD-10-CM

## 2016-06-11 DIAGNOSIS — E782 Mixed hyperlipidemia: Secondary | ICD-10-CM | POA: Insufficient documentation

## 2016-06-11 MED ORDER — SIMVASTATIN 10 MG PO TABS: 10.0000 mg | ORAL_TABLET | Freq: Every day | ORAL | Status: DC

## 2016-06-11 MED ORDER — AMLODIPINE BESYLATE 5 MG PO TABS: 5.0000 mg | ORAL_TABLET | Freq: Every day | ORAL | Status: DC

## 2016-06-11 NOTE — Patient Instructions (Signed)
Breast Self-Awareness Practicing breast self-awareness may pick up problems early, prevent significant medical complications, and possibly save your life. By practicing breast self-awareness, you can become familiar with how your breasts look and feel and if your breasts are changing. This allows you to notice changes early. It can also offer you some reassurance that your breast health is good. One way to learn what is normal for your breasts and whether your breasts are changing is to do a breast self-exam. If you find a lump or something that was not present in the past, it is best to contact your caregiver right away. Other findings that should be evaluated by your caregiver include nipple discharge, especially if it is bloody; skin changes or reddening; areas where the skin seems to be pulled in (retracted); or new lumps and bumps. Breast pain is seldom associated with cancer (malignancy), but should also be evaluated by a caregiver. HOW TO PERFORM A BREAST SELF-EXAM The best time to examine your breasts is 5-7 days after your menstrual period is over. During menstruation, the breasts are lumpier, and it may be more difficult to pick up changes. If you do not menstruate, have reached menopause, or had your uterus removed (hysterectomy), you should examine your breasts at regular intervals, such as monthly. If you are breastfeeding, examine your breasts after a feeding or after using a breast pump. Breast implants do not decrease the risk for lumps or tumors, so continue to perform breast self-exams as recommended. Talk to your caregiver about how to determine the difference between the implant and breast tissue. Also, talk about the amount of pressure you should use during the exam. Over time, you will become more familiar with the variations of your breasts and more comfortable with the exam. A breast self-exam requires you to remove all your clothes above the waist. 1. Look at your breasts and nipples.  Stand in front of a mirror in a room with good lighting. With your hands on your hips, push your hands firmly downward. Look for a difference in shape, contour, and size from one breast to the other (asymmetry). Asymmetry includes puckers, dips, or bumps. Also, look for skin changes, such as reddened or scaly areas on the breasts. Look for nipple changes, such as discharge, dimpling, repositioning, or redness. 2. Carefully feel your breasts. This is best done either in the shower or tub while using soapy water or when flat on your back. Place the arm (on the side of the breast you are examining) above your head. Use the pads (not the fingertips) of your three middle fingers on your opposite hand to feel your breasts. Start in the underarm area and use  inch (2 cm) overlapping circles to feel your breast. Use 3 different levels of pressure (light, medium, and firm pressure) at each circle before moving to the next circle. The light pressure is needed to feel the tissue closest to the skin. The medium pressure will help to feel breast tissue a little deeper, while the firm pressure is needed to feel the tissue close to the ribs. Continue the overlapping circles, moving downward over the breast until you feel your ribs below your breast. Then, move one finger-width towards the center of the body. Continue to use the  inch (2 cm) overlapping circles to feel your breast as you move slowly up toward the collar bone (clavicle) near the base of the neck. Continue the up and down exam using all 3 pressures until you reach the   middle of the chest. Do this with each breast, carefully feeling for lumps or changes. 3.  Keep a written record with breast changes or normal findings for each breast. By writing this information down, you do not need to depend only on memory for size, tenderness, or location. Write down where you are in your menstrual cycle, if you are still menstruating. Breast tissue can have some lumps or  thick tissue. However, see your caregiver if you find anything that concerns you.  SEEK MEDICAL CARE IF:  You see a change in shape, contour, or size of your breasts or nipples.   You see skin changes, such as reddened or scaly areas on the breasts or nipples.   You have an unusual discharge from your nipples.   You feel a new lump or unusually thick areas.    This information is not intended to replace advice given to you by your health care provider. Make sure you discuss any questions you have with your health care provider.   Document Released: 12/15/2005 Document Revised: 12/01/2012 Document Reviewed: 03/31/2012 Elsevier Interactive Patient Education 2016 Elsevier Inc.  

## 2016-06-11 NOTE — Progress Notes (Signed)
Date:  06/11/2016   Name:  Kristy Sellers   DOB:  [birth year]   MRN:  [id number]   Chief Complaint: Establish Care Hypertension This is a chronic problem. The current episode started more than 1 year ago. The problem is unchanged. The problem is controlled. Pertinent negatives include no chest pain, headaches, palpitations or shortness of breath. Past treatments include calcium channel blockers.  Hyperlipidemia This is a chronic problem. The current episode started more than 1 year ago. The problem is controlled. Recent lipid tests were reviewed and are normal. Pertinent negatives include no chest pain or shortness of breath. Current antihyperlipidemic treatment includes statins.  Hx of Phlebitis - RLE affected intermittently.  She wears compression stockings as needed.  She has had arterial doppler evaluation of circulation as well as cardiac stress testing - all normal. Hx C Diff colitis - hospitalized in 2016 due to dehydration and hypokalemia.  Responded quickly to antibiotics and fluids.  She has not had a recurrence.   Review of Systems  Constitutional: Negative for fever, appetite change, fatigue and unexpected weight change.  HENT: Negative for tinnitus and trouble swallowing.   Eyes: Positive for visual disturbance. Negative for pain and discharge.  Respiratory: Negative for cough, chest tightness, shortness of breath and wheezing.   Cardiovascular: Negative for chest pain, palpitations and leg swelling.  Gastrointestinal: Negative for abdominal pain, diarrhea and constipation.  Endocrine: Negative for polydipsia and polyuria.  Genitourinary: Negative for dysuria and hematuria.  Musculoskeletal: Negative for joint swelling, arthralgias and gait problem.  Skin: Negative for rash.  Neurological: Negative for tremors, numbness and headaches.  Psychiatric/Behavioral: Negative for dysphoric mood.    Patient Active Problem List   Diagnosis Date Noted  . Hyperlipidemia 06/11/2016    . Detached retina 05/18/2016  . Pseudoaphakia 05/18/2016  . 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 Ocular hypertension 05/18/2016  . Cataract 05/18/2016  . Hypokalemia 09/06/2015  . 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 Hypertension 09/27/2014    Prior to Admission medications   Medication Sig Start Date End Date Taking? Authorizing Provider  amLODipine (NORVASC) 10 MG tablet Take 10 mg by mouth daily.   Yes Historical Provider, MD  aspirin 81 MG tablet Take 81 mg by mouth daily.   Yes Historical Provider, MD  glucose blood (ONETOUCH VERIO) test strip  07/10/14  Yes Historical Provider, MD  HOMATROPAIRE 5 % ophthalmic solution Apply 1 drop to eye 4 (four) times daily. Place in right eye   Yes Historical Provider, MD  Lancets (ONETOUCH ULTRASOFT) lancets  07/10/14  Yes Historical Provider, MD  prednisoLONE acetate (PRED FORTE) 1 % ophthalmic suspension Apply 1 drop to eye 4 (four) times daily. 10/19/15  Yes Historical Provider, MD  Probiotic Product (ACIDOPHILUS PROBIOTIC BLEND) CAPS Take 1 capsule by mouth 2 (two) times daily. 09/08/15  Yes Bettey Costa, MD  simvastatin (ZOCOR) 10 MG tablet Take 10 mg by mouth at bedtime.   Yes Historical Provider, MD  lansoprazole (PREVACID) 30 MG capsule Take 1 capsule by mouth daily. Reported on 06/11/2016 09/27/12   Historical Provider, MD  losartan-hydrochlorothiazide (HYZAAR) 100-25 MG per tablet Take 1 tablet by mouth daily. Reported on 06/11/2016    Historical Provider, MD  potassium chloride (K-DUR,KLOR-CON) 10 MEQ tablet Take 1 tablet by mouth 3 (three) times a week. Reported on 06/11/2016    Historical Provider, MD    No Known Allergies  Past Surgical History  Procedure Laterality Date  . Abdominal hysterectomy    .  Retinal detachment surgery Right     Social History  Substance Use Topics  . Smoking status: Former Research scientist (life sciences)  . Smokeless tobacco: None  . Alcohol Use: No    Medication list has been reviewed and  updated.   Physical Exam  Constitutional: She is oriented to person, place, and time. She appears well-developed. No distress.  HENT:  Head: Normocephalic and atraumatic.  Eyes:  Cloudy right lens with downward deviation of eye  Neck: Normal range of motion. Neck supple.  Cardiovascular: Normal rate, regular rhythm and normal heart sounds.   No murmur heard. Pulmonary/Chest: Effort normal and breath sounds normal. No respiratory distress. She has no wheezes.  Musculoskeletal: She exhibits no edema or tenderness.  Mild tenderness of right lower leg and posterior calf - no edema, erythema, cord or varicose veins noted  Lymphadenopathy:    She has no cervical adenopathy.  Neurological: She is alert and oriented to person, place, and time. She has normal strength and normal reflexes. No sensory deficit. Coordination and gait normal.  Skin: Skin is warm and dry. No rash noted.  Psychiatric: She has a normal mood and affect. Her speech is normal and behavior is normal. Thought content normal. Cognition and memory are normal.  Nursing note and vitals reviewed.   BP 118/80 mmHg  Pulse 80  Ht 5\' 5"  (1.651 m)  Wt 180 lb (81.647 kg)  BMI 29.95 kg/m2  Assessment and Plan: 1. Hyperlipidemia On statin therapy - simvastatin (ZOCOR) 10 MG tablet; Take 1 tablet (10 mg total) by mouth at bedtime.  Dispense: 90 tablet; Refill: 3  2. 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 History of Clostridium difficile colitis Resolved; pt aware of s/s  4. Essential hypertension Controlled on low dose amlodipine - amLODipine (NORVASC) 5 MG tablet; Take 1 tablet (5 mg total) by mouth daily.  Dispense: 90 tablet; Refill: 3  5. Encounter for screening mammogram for breast cancer - MM DIGITAL SCREENING BILATERAL; Future  6. History of positive PPD  **Consider Cologuard in lieu of 10 yr colonoscopy** Pt to obtain records for [facility] w/r to cardiac workup (valvular abnormality) and  immunizations.  Halina Maidens, MD [facility]  06/11/2016

## 2016-07-04 DIAGNOSIS — H40022 Open angle with borderline findings, high risk, left eye: Secondary | ICD-10-CM | POA: Diagnosis not present

## 2016-07-04 DIAGNOSIS — H44112 Panuveitis, left eye: Secondary | ICD-10-CM | POA: Diagnosis not present

## 2016-08-07 ENCOUNTER — Other Ambulatory Visit: Payer: Self-pay

## 2016-08-07 ENCOUNTER — Telehealth: Payer: Self-pay | Admitting: Internal Medicine

## 2016-08-07 DIAGNOSIS — E119 Type 2 diabetes mellitus without complications: Secondary | ICD-10-CM

## 2016-08-07 MED ORDER — GLUCOSE BLOOD VI STRP: ORAL_STRIP | 3 refills | Status: DC

## 2016-08-07 NOTE — Telephone Encounter (Signed)
sent 

## 2016-08-07 NOTE — Telephone Encounter (Signed)
Pt called said she was out of her one touch verio test strips said she gets 100 strips pt was seen as a new patient on 06/11/16 transfer [place] pt, and pt is using [facility] in [place].

## 2016-08-14 ENCOUNTER — Other Ambulatory Visit: Payer: Self-pay | Admitting: Internal Medicine

## 2016-08-14 ENCOUNTER — Telehealth: Payer: Self-pay

## 2016-08-14 ENCOUNTER — Ambulatory Visit
Admission: RE | Admit: 2016-08-14 | Discharge: 2016-08-14 | Disposition: A | Discharge status: Home / Self Care | Payer: Medicare Other | Source: Ambulatory Visit | Attending: Internal Medicine | Admitting: Internal Medicine

## 2016-08-14 DIAGNOSIS — Z1231 Encounter for screening mammogram for malignant neoplasm of breast: Secondary | ICD-10-CM

## 2016-08-14 NOTE — Telephone Encounter (Signed)
Patient aware she will have to pay out of pocket. She check BS just cause she wants to and will pay for strips herself. I advise they are also OTC.

## 2016-09-16 ENCOUNTER — Ambulatory Visit (INDEPENDENT_AMBULATORY_CARE_PROVIDER_SITE_OTHER): Payer: Medicare Other | Admitting: Internal Medicine

## 2016-09-16 ENCOUNTER — Encounter: Payer: Self-pay | Admitting: Internal Medicine

## 2016-09-16 VITALS — BP 108/80 | HR 68 | Resp 16 | Ht 65.0 in | Wt 180.0 lb

## 2016-09-16 DIAGNOSIS — F419 Anxiety disorder, unspecified: Secondary | ICD-10-CM | POA: Diagnosis not present

## 2016-09-16 DIAGNOSIS — I10 Essential (primary) hypertension: Secondary | ICD-10-CM

## 2016-09-16 MED ORDER — LORAZEPAM 0.5 MG PO TABS: 0.5000 mg | ORAL_TABLET | Freq: Every day | ORAL | 1 refills | Status: DC

## 2016-09-16 NOTE — Progress Notes (Signed)
Date:  09/16/2016   Name:  Kristy Sellers   DOB:  02/20/1941   MRN:  [id number]   Chief Complaint: Depression Depression         This is a new problem.  The onset quality is gradual.   Associated symptoms include insomnia and restlessness.  Associated symptoms include no decreased concentration, no fatigue and no suicidal ideas.  Past medical history includes anxiety.   Anxiety  Presents for initial visit. Onset was at an unknown time. The problem has been gradually worsening. Symptoms include excessive worry, insomnia, nervous/anxious behavior and restlessness. Patient reports no chest pain, confusion, decreased concentration, palpitations, shortness of breath or suicidal ideas. The severity of symptoms is moderate. The symptoms are aggravated by family issues. The quality of sleep is fair. Nighttime awakenings: one to two.   Past treatments include nothing.  She's been coping fairly well with a number of traumas over the past year and a half. These involve the passing of her son, the [place] of her [place] home and the diagnosis of her great-grandson with a brain tumor. Her great-grandson passed away earlier this month and what she returned from the [place] she's been unable to sleep. She feels anxious and jittery in the evening and finds it hard to stop thinking in order to relax and sleep. She wakes up several times during the night and is awake for an hour to an hour and a half. She does not take any medications for sleep. She does not feel that she is out right depressed; she denies crying episode suicidal thoughts or significant change in appetite or energy levels.    Review of Systems  Constitutional: Negative for diaphoresis, fatigue, fever and unexpected weight change.  Respiratory: Negative for chest tightness and shortness of breath.   Cardiovascular: Negative for chest pain, palpitations and leg swelling.  Gastrointestinal: Negative for abdominal pain.    Musculoskeletal: Positive for arthralgias.  Hematological: Negative for adenopathy.  Psychiatric/Behavioral: Positive for depression and sleep disturbance. Negative for confusion, decreased concentration, dysphoric mood and suicidal ideas. The patient is nervous/anxious and has insomnia.     Patient Active Problem List   Diagnosis Date Noted  . Hyperlipidemia 06/11/2016  . Blind right eye 06/11/2016  . History of Clostridium difficile colitis 06/11/2016  . History of positive PPD 06/11/2016  . Detached retina 05/18/2016  . Pseudoaphakia 05/18/2016  . 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 Ocular hypertension 05/18/2016  . Cataract 05/18/2016  . 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 Hypertension 09/27/2014    Prior to Admission medications   Medication Sig Start Date End Date Taking? Authorizing Provider  amLODipine (NORVASC) 5 MG tablet Take 1 tablet (5 mg total) by mouth daily. 06/11/16  Yes Glean Hess, MD  aspirin 81 MG tablet Take 81 mg by mouth daily.   Yes Historical Provider, MD  HOMATROPAIRE 5 % ophthalmic solution Apply 1 drop to eye 4 (four) times daily. Place in right eye   Yes Historical Provider, MD  Lancets (ONETOUCH ULTRASOFT) lancets  07/10/14  Yes Historical Provider, MD  potassium chloride (K-DUR,KLOR-CON) 10 MEQ tablet Take 1 tablet by mouth 3 (three) times a week. Reported on 06/11/2016   Yes Historical Provider, MD  prednisoLONE acetate (PRED FORTE) 1 % ophthalmic suspension Apply 1 drop to eye 4 (four) times daily. 10/19/15  Yes Historical Provider, MD  Probiotic Product (ACIDOPHILUS PROBIOTIC BLEND) CAPS Take 1 capsule by mouth 2 (two) times daily. 09/08/15  Yes Bettey Costa, MD  simvastatin (ZOCOR) 10 MG tablet Take 1 tablet (10 mg total) by mouth at bedtime. 06/11/16  Yes Glean Hess, MD    No Known Allergies  Past Surgical History:  Procedure Laterality Date  . ABDOMINAL HYSTERECTOMY    . COLONOSCOPY  ~2006   normal  . RETINAL  DETACHMENT SURGERY Right     Social History  Substance Use Topics  . Smoking status: Former Smoker    Types: Cigarettes    Quit date: 12/30/1983  . Smokeless tobacco: Never Used  . Alcohol use No   Depression screen PHQ 2/9 09/16/2016  Decreased Interest 0  Down, Depressed, Hopeless 0  PHQ - 2 Score 0  Altered sleeping 3  Tired, decreased energy 0  Change in appetite 1  Feeling bad or failure about yourself  0  Trouble concentrating 0  Moving slowly or fidgety/restless 0  Suicidal thoughts 0  PHQ-9 Score 4  Difficult doing work/chores Not difficult at all   GAD 7 : Generalized Anxiety Score 09/16/2016  Nervous, Anxious, on Edge 2  Control/stop worrying 3  Worry too much - different things 3  Trouble relaxing 3  Restless 0  Easily annoyed or irritable 0  Afraid - awful might happen 0  Total GAD 7 Score 11  Anxiety Difficulty Somewhat difficult     Medication list has been reviewed and updated.   Physical Exam  Constitutional: She is oriented to person, place, and time. She appears well-developed. No distress.  HENT:  Head: Normocephalic and atraumatic.  Cardiovascular: Normal rate, regular rhythm and normal heart sounds.   Pulmonary/Chest: Effort normal and breath sounds normal. No respiratory distress.  Musculoskeletal: Normal range of motion.  Neurological: She is alert and oriented to person, place, and time.  Skin: Skin is warm and dry. No rash noted.  Psychiatric: She has a normal mood and affect. Her speech is normal and behavior is normal. Thought content normal.  Nursing note and vitals reviewed.   BP 108/80   Pulse 68   Resp 16   Ht 5\' 5"  (1.651 m)   Wt 180 lb (81.6 kg)   SpO2 98%   BMI 29.95 kg/m   Assessment and Plan: 1. Acute anxiety Begin low dose medication to help with sleep Taper as soon as able - LORazepam (ATIVAN) 0.5 MG tablet; Take 1 tablet (0.5 mg total) by mouth at bedtime.  Dispense: 30 tablet; Refill: 1  2. Essential  hypertension controlled   Halina Maidens, MD [facility]  09/16/2016

## 2016-10-14 ENCOUNTER — Encounter: Payer: Self-pay | Admitting: Internal Medicine

## 2016-10-14 ENCOUNTER — Ambulatory Visit (INDEPENDENT_AMBULATORY_CARE_PROVIDER_SITE_OTHER): Payer: Medicare Other | Admitting: Internal Medicine

## 2016-10-14 VITALS — BP 120/78 | HR 68 | Resp 16 | Ht 65.0 in | Wt 178.0 lb

## 2016-10-14 DIAGNOSIS — Z23 Encounter for immunization: Secondary | ICD-10-CM | POA: Diagnosis not present

## 2016-10-14 DIAGNOSIS — F419 Anxiety disorder, unspecified: Secondary | ICD-10-CM

## 2016-10-14 DIAGNOSIS — E782 Mixed hyperlipidemia: Secondary | ICD-10-CM

## 2016-10-14 DIAGNOSIS — I10 Essential (primary) hypertension: Secondary | ICD-10-CM | POA: Diagnosis not present

## 2016-10-14 DIAGNOSIS — Z0000 Encounter for general adult medical examination without abnormal findings: Secondary | ICD-10-CM | POA: Diagnosis not present

## 2016-10-14 DIAGNOSIS — Z1211 Encounter for screening for malignant neoplasm of colon: Secondary | ICD-10-CM

## 2016-10-14 LAB — POCT URINALYSIS DIPSTICK
BILIRUBIN UA: NEGATIVE
Blood, UA: NEGATIVE
GLUCOSE UA: NEGATIVE
KETONES UA: NEGATIVE
LEUKOCYTES UA: NEGATIVE
NITRITE UA: NEGATIVE
PH UA: 6.5
Protein, UA: NEGATIVE
Spec Grav, UA: 1.015
Urobilinogen, UA: 0.2

## 2016-10-14 NOTE — Patient Instructions (Signed)
Health Maintenance  Topic Date Due  . COLONOSCOPY  10/14/2016  . OPHTHALMOLOGY EXAM  12/29/2016  . TETANUS/TDAP  07/10/2024  . INFLUENZA VACCINE  Completed  . DEXA SCAN  Completed  . ZOSTAVAX  Completed  . PNA vac Low Risk Adult  Completed  . FOOT EXAM  Excluded  . HEMOGLOBIN A1C  Excluded  . URINE MICROALBUMIN  Excluded   DASH Eating Plan DASH stands for "Dietary Approaches to Stop Hypertension." The DASH eating plan is a healthy eating plan that has been shown to reduce high blood pressure (hypertension). Additional health benefits may include reducing the risk of type 2 diabetes mellitus, heart disease, and stroke. The DASH eating plan may also help with weight loss. WHAT DO I NEED TO KNOW ABOUT THE DASH EATING PLAN? For the DASH eating plan, you will follow these general guidelines:  Choose foods with a percent daily value for sodium of less than 5% (as listed on the food label).  Use salt-free seasonings or herbs instead of table salt or sea salt.  Check with your health care provider or pharmacist before using salt substitutes.  Eat lower-sodium products, often labeled as "lower sodium" or "no salt added."  Eat fresh foods.  Eat more vegetables, fruits, and low-fat dairy products.  Choose whole grains. Look for the word "whole" as the first word in the ingredient list.  Choose fish and skinless chicken or [place] more often than red meat. Limit fish, poultry, and meat to 6 oz (170 g) each day.  Limit sweets, desserts, sugars, and sugary drinks.  Choose heart-healthy fats.  Limit cheese to 1 oz (28 g) per day.  Eat more home-cooked food and less restaurant, buffet, and fast food.  Limit fried foods.  Cook foods using methods other than frying.  Limit canned vegetables. If you do use them, rinse them well to decrease the sodium.  When eating at a restaurant, ask that your food be prepared with less salt, or no salt if possible. WHAT FOODS CAN I EAT? Seek help from  a dietitian for individual calorie needs. Grains Whole grain or whole wheat bread. Foots rice. Whole grain or whole wheat pasta. Quinoa, bulgur, and whole grain cereals. Low-sodium cereals. Corn or whole wheat flour tortillas. Whole grain cornbread. Whole grain crackers. Low-sodium crackers. Vegetables Fresh or frozen vegetables (raw, steamed, roasted, or grilled). Low-sodium or reduced-sodium tomato and vegetable juices. Low-sodium or reduced-sodium tomato sauce and paste. Low-sodium or reduced-sodium canned vegetables.  Fruits All fresh, canned (in natural juice), or frozen fruits. Meat and Other Protein Products Ground beef (85% or leaner), grass-fed beef, or beef trimmed of fat. Skinless chicken or [place]. Ground chicken or [place]. Pork trimmed of fat. All fish and seafood. Eggs. Dried beans, peas, or lentils. Unsalted nuts and seeds. Unsalted canned beans. Dairy Low-fat dairy products, such as skim or 1% milk, 2% or reduced-fat cheeses, low-fat ricotta or cottage cheese, or plain low-fat yogurt. Low-sodium or reduced-sodium cheeses. Fats and Oils Tub margarines without trans fats. Light or reduced-fat mayonnaise and salad dressings (reduced sodium). Avocado. Safflower, olive, or canola oils. Natural peanut or almond butter. Other Unsalted popcorn and pretzels. The items listed above may not be a complete list of recommended foods or beverages. Contact your dietitian for more options. WHAT FOODS ARE NOT RECOMMENDED? Grains White bread. White pasta. White rice. Refined cornbread. Bagels and croissants. Crackers that contain trans fat. Vegetables Creamed or fried vegetables. Vegetables in a cheese sauce. Regular canned vegetables. Regular canned tomato  sauce and paste. Regular tomato and vegetable juices. Fruits Dried fruits. Canned fruit in light or heavy syrup. Fruit juice. Meat and Other Protein Products Fatty cuts of meat. Ribs, chicken wings, bacon, sausage, bologna, salami,  chitterlings, fatback, hot dogs, bratwurst, and packaged luncheon meats. Salted nuts and seeds. Canned beans with salt. Dairy Whole or 2% milk, cream, half-and-half, and cream cheese. Whole-fat or sweetened yogurt. Full-fat cheeses or blue cheese. Nondairy creamers and whipped toppings. Processed cheese, cheese spreads, or cheese curds. Condiments Onion and garlic salt, seasoned salt, table salt, and sea salt. Canned and packaged gravies. Worcestershire sauce. Tartar sauce. Barbecue sauce. Teriyaki sauce. Soy sauce, including reduced sodium. Steak sauce. Fish sauce. Oyster sauce. Cocktail sauce. Horseradish. Ketchup and mustard. Meat flavorings and tenderizers. Bouillon cubes. Hot sauce. Tabasco sauce. Marinades. Taco seasonings. Relishes. Fats and Oils Butter, stick margarine, lard, shortening, ghee, and bacon fat. Coconut, palm kernel, or palm oils. Regular salad dressings. Other Pickles and olives. Salted popcorn and pretzels. The items listed above may not be a complete list of foods and beverages to avoid. Contact your dietitian for more information. WHERE CAN I FIND MORE INFORMATION? National Heart, Lung, and Blood Institute: travelstabloid.com   This information is not intended to replace advice given to you by your health care provider. Make sure you discuss any questions you have with your health care provider.   Document Released: 12/04/2011 Document Revised: 01/05/2015 Document Reviewed: 10/19/2013 Elsevier Interactive Patient Education [employer].

## 2016-10-14 NOTE — Progress Notes (Signed)
Patient: Kristy Sellers, Female    DOB: 1941-11-25, 75 y.o.   MRN: [id number] Visit Date: 10/14/2016  Today's Provider: Halina Maidens, MD   Chief Complaint  Patient presents with  . Medicare Wellness  . Hyperlipidemia  . Hypertension   Subjective:    Annual wellness visit Kristy Sellers is a 75 y.o. female who presents today for her Subsequent Annual Wellness Visit. She feels fairly well. She reports exercising walking. She reports she is sleeping well - using ativan rarely. She recently had mammogram.  ----------------------------------------------------------- Hyperlipidemia  This is a chronic problem. The problem is controlled. Recent lipid tests were reviewed and are normal. Pertinent negatives include no chest pain or shortness of breath. Current antihyperlipidemic treatment includes statins.  Hypertension  This is a chronic problem. The current episode started more than 1 year ago. The problem is unchanged. The problem is controlled. Pertinent negatives include no chest pain, headaches, palpitations or shortness of breath.  Anxiety - improving with time.  Using ativan rarely to help with sleep.   Review of Systems  Constitutional: Negative for chills, fatigue and fever.  HENT: Negative for congestion, hearing loss, tinnitus, trouble swallowing and voice change.   Eyes: Negative for visual disturbance.  Respiratory: Negative for cough, chest tightness, shortness of breath and wheezing.   Cardiovascular: Negative for chest pain, palpitations and leg swelling.  Gastrointestinal: Negative for abdominal pain, constipation, diarrhea and vomiting.  Endocrine: Negative for polydipsia and polyuria.  Genitourinary: Negative for dysuria, frequency, genital sores, vaginal bleeding and vaginal discharge.  Musculoskeletal: Negative for arthralgias, gait problem and joint swelling.  Skin: Negative for color change and rash.  Neurological: Negative for dizziness, tremors, light-headedness  and headaches.  Hematological: Negative for adenopathy. Does not bruise/bleed easily.  Psychiatric/Behavioral: Negative for dysphoric mood and sleep disturbance. The patient is not nervous/anxious.     Social History   Social History  . Marital status: Married    Spouse name: N/A  . Number of children: N/A  . Years of education: N/A   Occupational History  . Not on file.   Social History Main Topics  . Smoking status: Former Smoker    Types: Cigarettes    Quit date: 12/30/1983  . Smokeless tobacco: Never Used  . Alcohol use No  . Drug use: Unknown  . Sexual activity: Not on file   Other Topics Concern  . Not on file   Social History Narrative   Married, retired - volunteers at [employer]    Patient Active Problem List   Diagnosis Date Noted  . Hyperlipidemia 06/11/2016  . Blind right eye 06/11/2016  . History of Clostridium difficile colitis 06/11/2016  . History of positive PPD 06/11/2016  . Detached retina 05/18/2016  . Pseudoaphakia 05/18/2016  . 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 Ocular hypertension 05/18/2016  . Cataract 05/18/2016  . 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 Hypertension 09/27/2014    Past Surgical History:  Procedure Laterality Date  . ABDOMINAL HYSTERECTOMY    . COLONOSCOPY  ~2006   normal  . RETINAL DETACHMENT SURGERY Right     Her family history includes CAD in her father and son; Parkinson's disease in her mother.    Previous Medications   AMLODIPINE (NORVASC) 5 MG TABLET    Take 1 tablet (5 mg total) by mouth daily.   ASPIRIN 81 MG TABLET    Take 81 mg by mouth daily.   HOMATROPAIRE 5 % OPHTHALMIC SOLUTION    Apply  1 drop to eye 4 (four) times daily. Place in right eye   LANCETS (ONETOUCH ULTRASOFT) LANCETS       LORAZEPAM (ATIVAN) 0.5 MG TABLET    Take 1 tablet (0.5 mg total) by mouth at bedtime.   PREDNISOLONE ACETATE (PRED FORTE) 1 % OPHTHALMIC SUSPENSION    Apply 1 drop to eye 4 (four) times daily.    PROBIOTIC PRODUCT (ACIDOPHILUS PROBIOTIC BLEND) CAPS    Take 1 capsule by mouth 2 (two) times daily.   SIMVASTATIN (ZOCOR) 10 MG TABLET    Take 1 tablet (10 mg total) by mouth at bedtime.    Patient Care Team: Glean Hess, MD as PCP - General (Family Medicine)     Objective:   Vitals: BP 120/78   Pulse 68   Resp 16   Ht 5\' 5"  (1.651 m)   Wt 178 lb (80.7 kg)   SpO2 98%   BMI 29.62 kg/m   Physical Exam  Constitutional: She is oriented to person, place, and time. She appears well-developed and well-nourished. No distress.  HENT:  Head: Normocephalic and atraumatic.  Right Ear: Tympanic membrane and ear canal normal.  Left Ear: Tympanic membrane and ear canal normal.  Nose: Right sinus exhibits no maxillary sinus tenderness. Left sinus exhibits no maxillary sinus tenderness.  Mouth/Throat: Uvula is midline and oropharynx is clear and moist.  Eyes: Conjunctivae and EOM are normal. Right eye exhibits no discharge. Left eye exhibits no discharge. No scleral icterus.  Cloudy right lens with downward deviation of eye  Neck: Normal range of motion. Neck supple. Carotid bruit is not present. No erythema present. No thyromegaly present.  Cardiovascular: Normal rate, regular rhythm, normal heart sounds and normal pulses.   No murmur heard. Pulmonary/Chest: Effort normal and breath sounds normal. No respiratory distress. She has no wheezes. Right breast exhibits no mass, no nipple discharge, no skin change and no tenderness. Left breast exhibits no mass, no nipple discharge, no skin change and no tenderness.  Abdominal: Soft. Bowel sounds are normal. There is no hepatosplenomegaly. There is no tenderness. There is no CVA tenderness.  Musculoskeletal: Normal range of motion. She exhibits no edema.       Right hip: She exhibits tenderness (over lateral bursa).  Lymphadenopathy:    She has no cervical adenopathy.    She has no axillary adenopathy.  Neurological: She is alert and oriented  to person, place, and time. She has normal strength and normal reflexes. No cranial nerve deficit or sensory deficit. Coordination and gait normal.  Skin: Skin is warm, dry and intact. No rash noted.  Psychiatric: She has a normal mood and affect. Her speech is normal and behavior is normal. Thought content normal. Cognition and memory are normal.  Nursing note and vitals reviewed.   Activities of Daily Living In your present state of health, do you have any difficulty performing the following activities: 10/14/2016 06/11/2016  Hearing? N N  Vision? N N  Difficulty concentrating or making decisions? N N  Walking or climbing stairs? N N  Dressing or bathing? N N  Doing errands, shopping? N N  Preparing Food and eating ? N -  Using the Toilet? N -  In the past six months, have you accidently leaked urine? N -  Do you have problems with loss of bowel control? N -  Managing your Medications? N -  Managing your Finances? N -  Housekeeping or managing your Housekeeping? N -  Some recent data might be  hidden    Fall Risk Assessment Fall Risk  10/14/2016 09/16/2016 06/11/2016  Falls in the past year? No No No      Depression Screen PHQ 2/9 Scores 10/14/2016 09/16/2016 06/11/2016  PHQ - 2 Score 0 0 1  PHQ- 9 Score - 4 -    Cognitive Testing - 6-CIT   Correct? Score   What year is it? yes 0 Yes = 0    No = 4  What month is it? no 0 Yes = 0    No = 3  Remember:     [address]ia [address], [place], [place]     What time is it? yes 0 Yes = 0    No = 3  Count backwards from 20 to 1 yes 0 Correct = 0    1 error = 2   More than 1 error = 4  Say the months of the year in reverse. yes 0 Correct = 0    1 error = 2   More than 1 error = 4  What address did I ask you to remember? yes 0 Correct = 0  1 error = 2    2 error = 4    3 error = 6    4 error = 8    All wrong = 10       TOTAL SCORE  0/28   Interpretation:  Normal  Normal (0-7) Abnormal (8-28)        Medicare Annual Wellness Visit  Summary:  Reviewed patient's Family Medical History Reviewed and updated list of patient's medical providers Assessment of cognitive impairment was done Assessed patient's functional ability Established a written schedule for health screening [place] Completed and Reviewed  Exercise Activities and Dietary recommendations Goals    None      Immunization History  Administered Date(s) Administered  . Pneumococcal-Unspecified 12/29/2013  . Tdap 07/10/2014  . Zoster 09/28/2013    Health Maintenance  Topic Date Due  . HEMOGLOBIN A1C  04/18/40  . FOOT EXAM  12/21/1950  . URINE MICROALBUMIN  12/21/1950  . COLONOSCOPY  10/14/2016  . OPHTHALMOLOGY EXAM  12/29/2016  . TETANUS/TDAP  07/10/2024  . INFLUENZA VACCINE  Completed  . DEXA SCAN  Completed  . ZOSTAVAX  Completed  . PNA vac Low Risk Adult  Completed     Discussed health benefits of physical activity, and encouraged her to engage in regular exercise appropriate for her age and condition.    ------------------------------------------------------------------------------------------------------------   Assessment & Plan:  1. Medicare annual wellness visit, subsequent Measures satisfied  2. Essential hypertension controlled - CBC with Differential/Platelet - Comprehensive metabolic panel - TSH - POCT urinalysis dipstick  3. Mixed hyperlipidemia On statin therapy - Lipid panel  4. Need for influenza vaccination - Flu Vaccine QUAD 36+ mos IM  5. Colon cancer screening - Cologuard  6. Acute anxiety Resolving - continue to use ativan PRN and taper off when able   Halina Maidens, MD [facility]  10/14/2016

## 2016-10-15 LAB — LIPID PANEL
CHOLESTEROL TOTAL: 164 mg/dL (ref 100–199)
Chol/HDL Ratio: 2.4 ratio units (ref 0.0–4.4)
HDL: 68 mg/dL (ref >39)
LDL CALC: 82 mg/dL (ref 0–99)
TRIGLYCERIDES: 68 mg/dL (ref 0–149)
VLDL CHOLESTEROL CAL: 14 mg/dL (ref 5–40)

## 2016-10-15 LAB — CBC WITH DIFFERENTIAL/PLATELET
BASOS ABS: 0 10*3/uL (ref 0.0–0.2)
Basos: 0 %
EOS (ABSOLUTE): 0.2 10*3/uL (ref 0.0–0.4)
Eos: 3 %
HEMOGLOBIN: 13 g/dL (ref 11.1–15.9)
Hematocrit: 41.2 % (ref 34.0–46.6)
Immature Grans (Abs): 0 10*3/uL (ref 0.0–0.1)
Immature Granulocytes: 0 %
LYMPHS ABS: 1.4 10*3/uL (ref 0.7–3.1)
LYMPHS: 18 %
MCH: 25.6 pg — ABNORMAL LOW (ref 26.6–33.0)
MCHC: 31.6 g/dL (ref 31.5–35.7)
MCV: 81 fL (ref 79–97)
MONOCYTES: 6 %
Monocytes Absolute: 0.5 10*3/uL (ref 0.1–0.9)
NEUTROS PCT: 73 %
Neutrophils Absolute: 5.7 10*3/uL (ref 1.4–7.0)
Platelets: 263 10*3/uL (ref 150–379)
RBC: 5.07 x10E6/uL (ref 3.77–5.28)
RDW: 15.6 % — AB (ref 12.3–15.4)
WBC: 7.9 10*3/uL (ref 3.4–10.8)

## 2016-10-15 LAB — TSH: TSH: 3.26 u[IU]/mL (ref 0.450–4.500)

## 2016-10-15 LAB — COMPREHENSIVE METABOLIC PANEL
ALBUMIN: 4 g/dL (ref 3.5–4.8)
ALK PHOS: 74 IU/L (ref 39–117)
ALT: 10 IU/L (ref 0–32)
AST: 15 IU/L (ref 0–40)
Albumin/Globulin Ratio: 1.4 (ref 1.2–2.2)
BUN/Creatinine Ratio: 12 (ref 12–28)
BUN: 10 mg/dL (ref 8–27)
Bilirubin Total: 0.5 mg/dL (ref 0.0–1.2)
CO2: 28 mmol/L (ref 18–29)
CREATININE: 0.81 mg/dL (ref 0.57–1.00)
Calcium: 9 mg/dL (ref 8.7–10.3)
Chloride: 103 mmol/L (ref 96–106)
GFR calc Af Amer: 82 mL/min/{1.73_m2} (ref >59)
GFR calc non Af Amer: 71 mL/min/{1.73_m2} (ref >59)
GLUCOSE: 98 mg/dL (ref 65–99)
Globulin, Total: 2.9 g/dL (ref 1.5–4.5)
Potassium: 3.9 mmol/L (ref 3.5–5.2)
Sodium: 144 mmol/L (ref 134–144)
Total Protein: 6.9 g/dL (ref 6.0–8.5)

## 2016-10-21 IMAGING — MG MM DIGITAL SCREENING BILAT W/ CAD
1 series · 4 of 4 positions shown · non-contrast
Comparison: Previous exam(s).

CLINICAL DATA: Screening.

EXAM:
DIGITAL SCREENING BILATERAL MAMMOGRAM WITH CAD

[R CC · right · 4 of 4 slices shown]
[im 1/4]
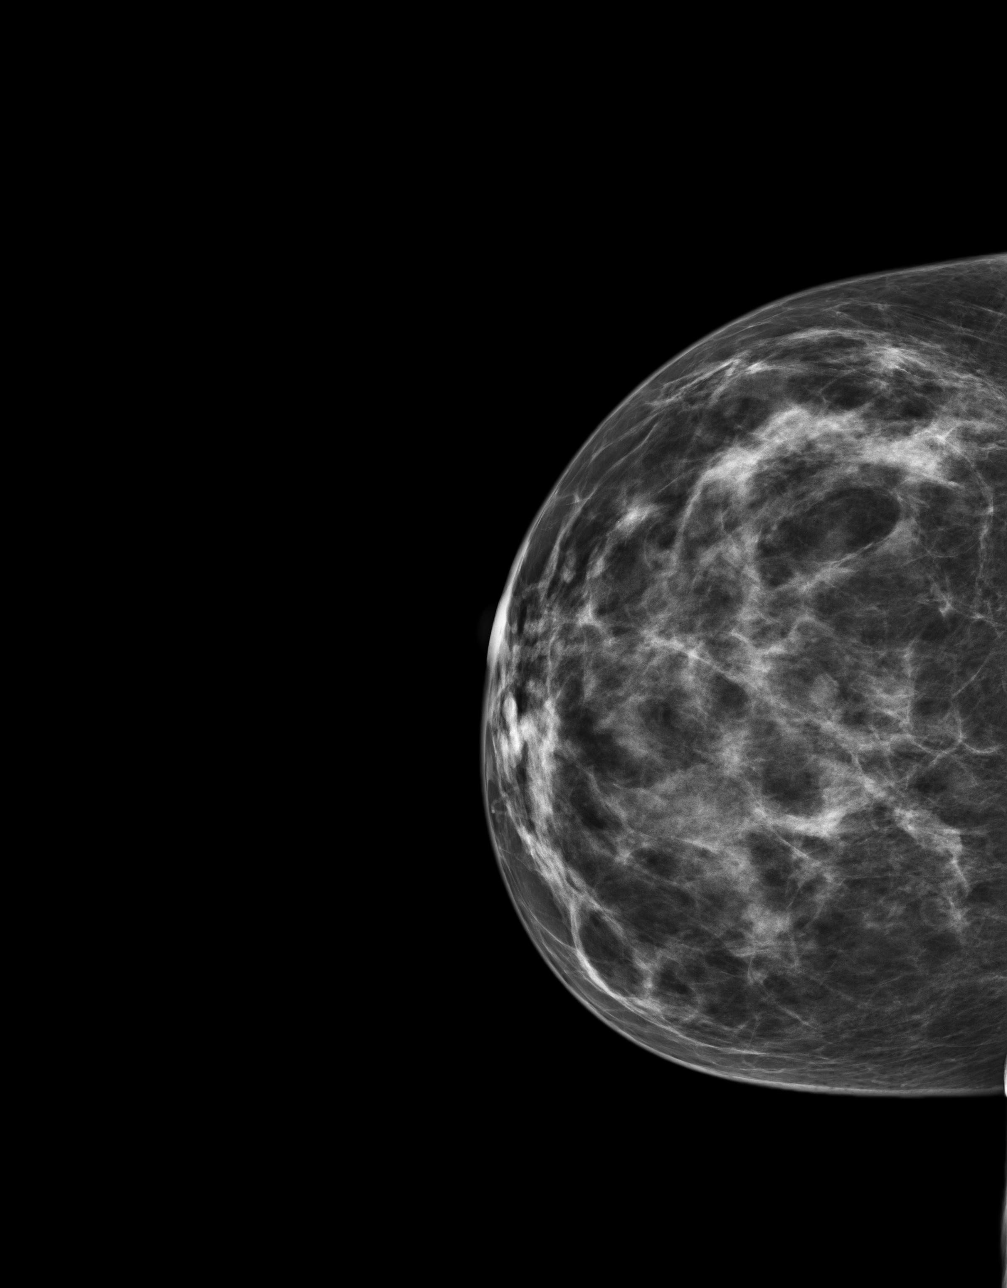
[im 2/4]
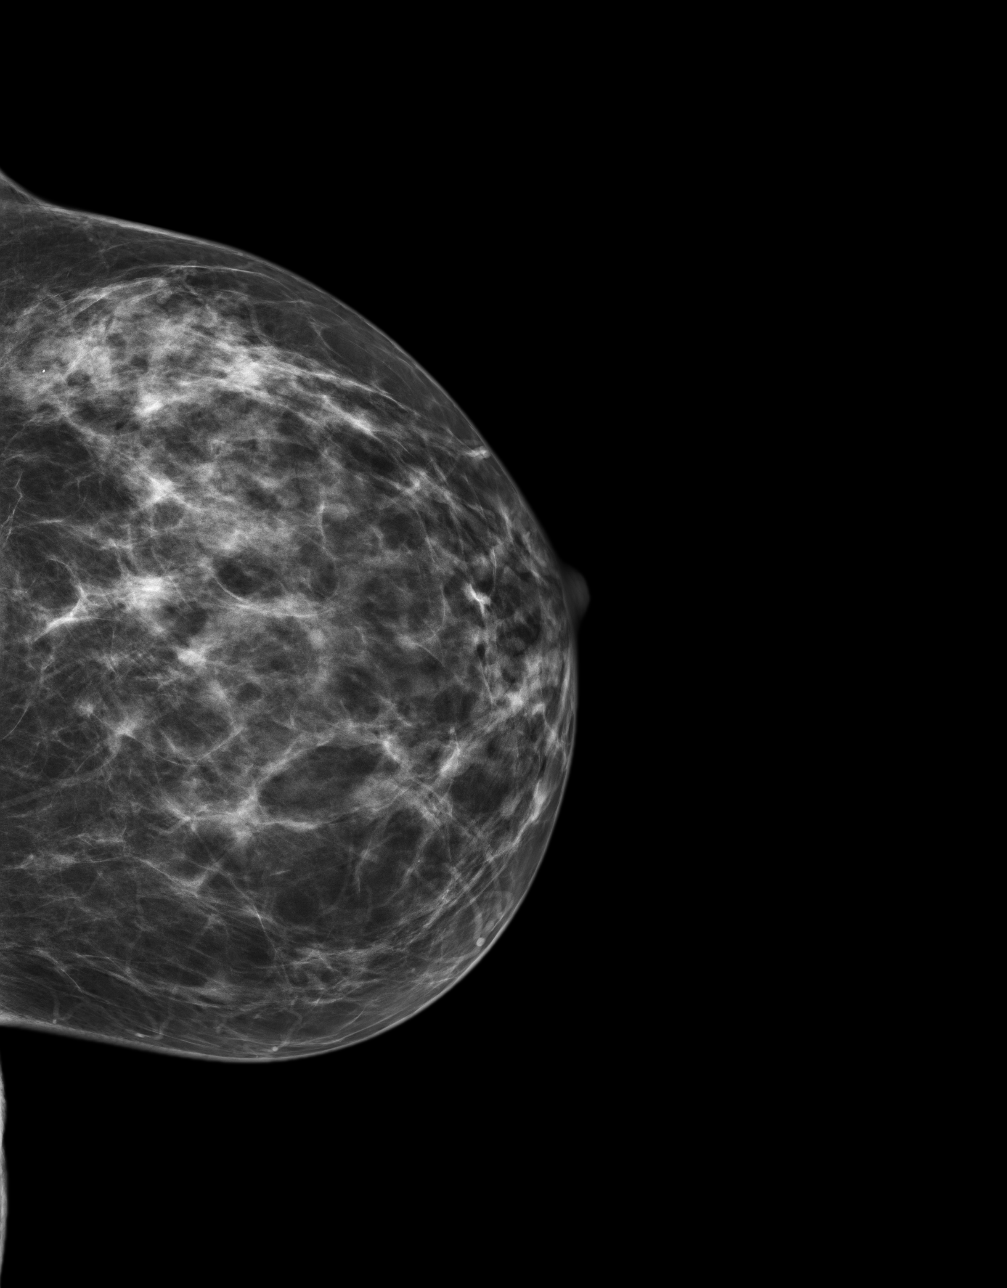
[im 3/4]
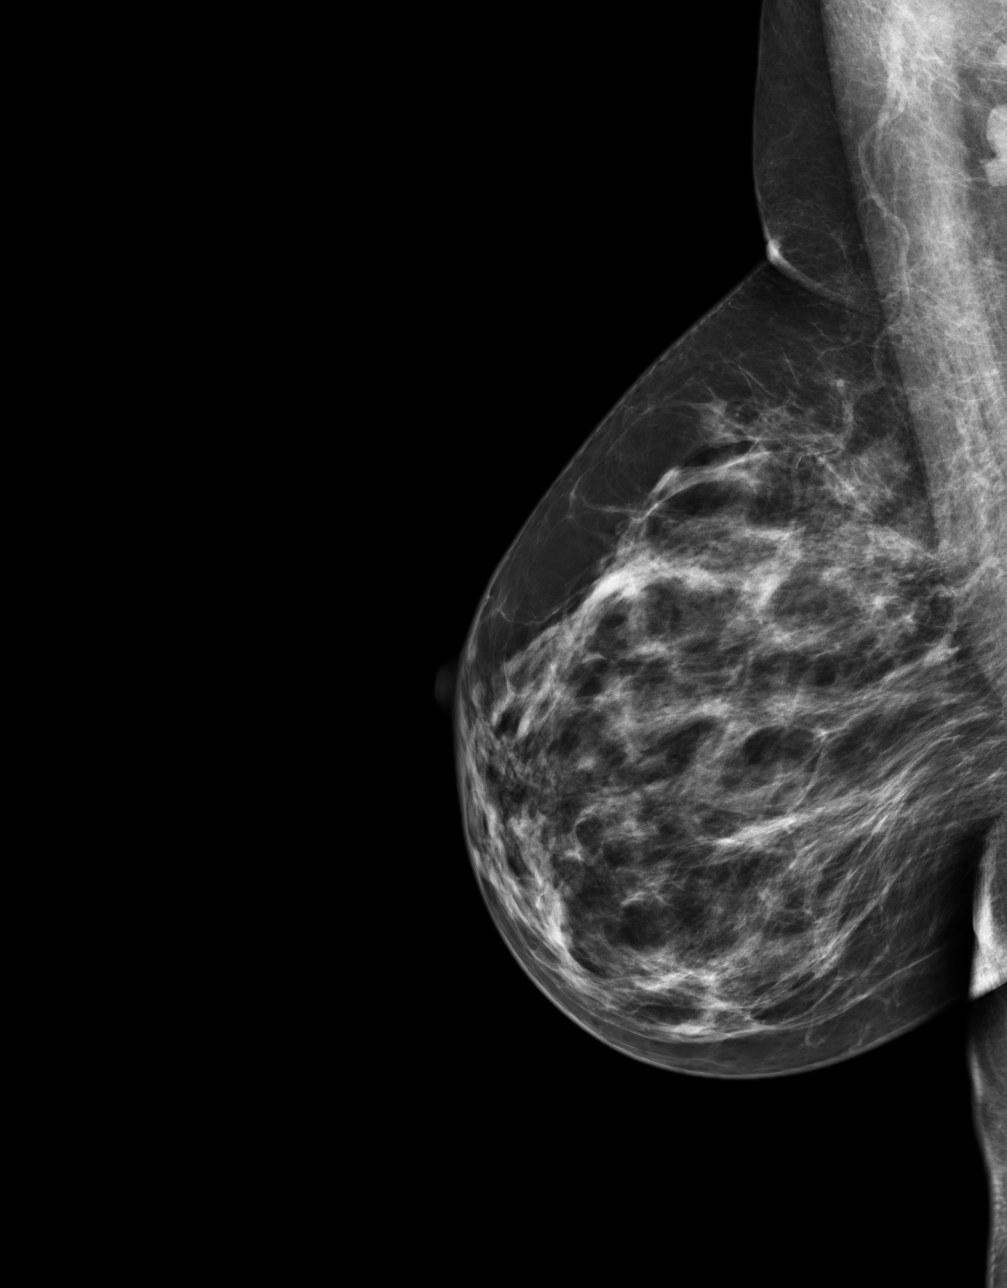
[im 4/4]
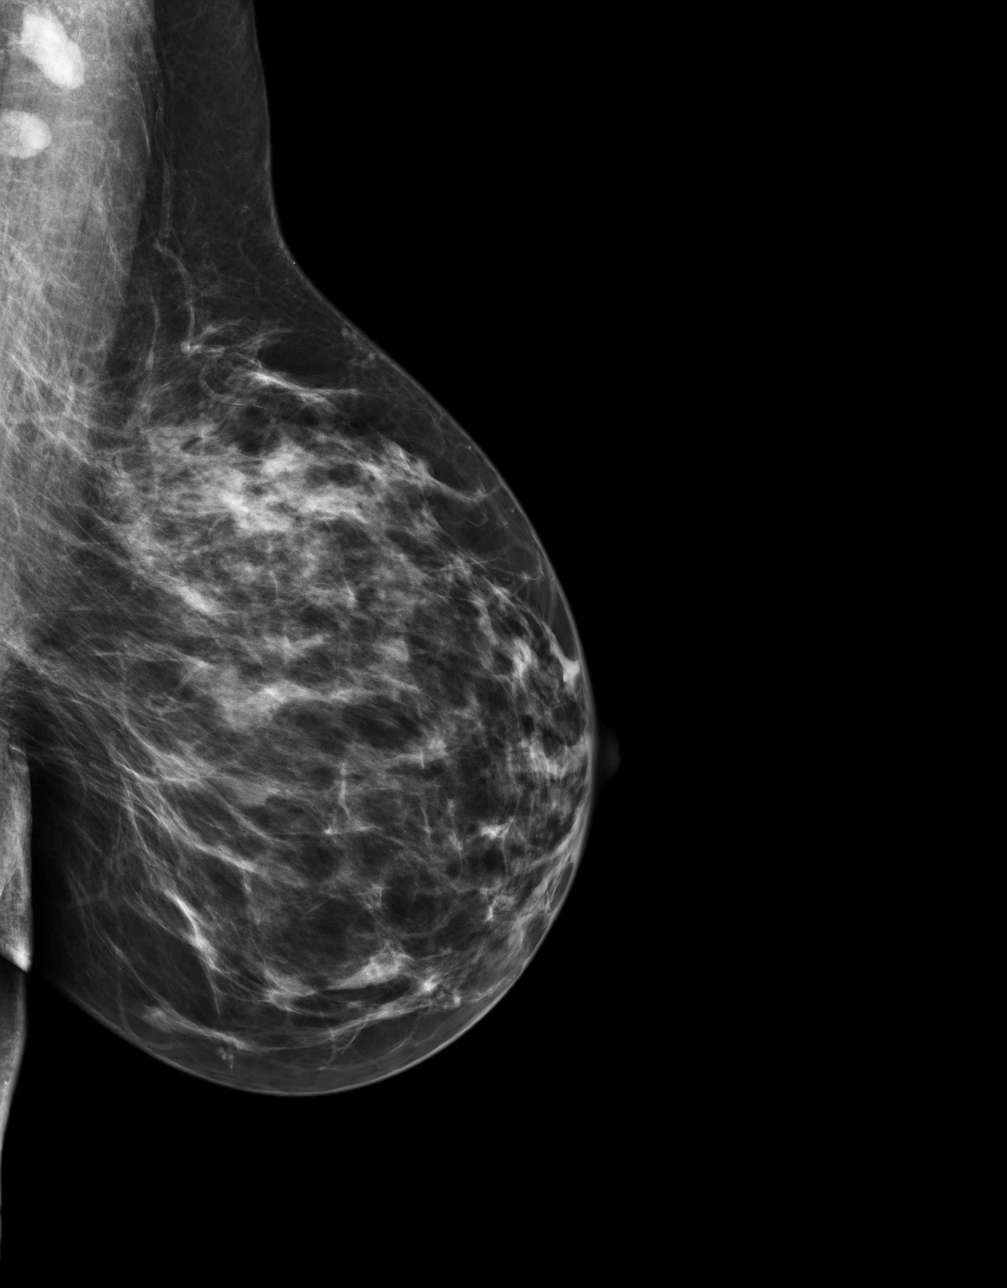

[4 of 4 positions shown; findings below may reference images not displayed]

ACR Breast Density Category c: The breast tissue is heterogeneously
dense, which may obscure small masses.
FINDINGS: There are no findings suspicious for malignancy. Images were
processed with CAD.
IMPRESSION: No mammographic evidence of malignancy. A result letter of this
screening mammogram will be mailed directly to the patient.

RECOMMENDATION:
Screening mammogram in one year. (Code:[id number])

BI-RADS CATEGORY  1: Negative.

## 2016-11-13 IMAGING — CT CT ABD-PELV W/ CM
2 of 6 series · 15 of 46 positions shown, 17 images · IV contrast (omnipaque)
Comparison: None.

CLINICAL DATA: 73-year-old female with generalized abdominal pain
and low back pain. Diarrhea.

EXAM:
CT ABDOMEN AND PELVIS WITH CONTRAST
TECHNIQUE: Multidetector CT imaging of the abdomen and pelvis was performed
using the standard protocol following bolus administration of
intravenous contrast.
CONTRAST:  100mL OMNIPAQUE IOHEXOL 350 MG/ML SOLN

[Series 2: routine abd pel with · axial · 0.68mm/px · z∈[-373,-8]mm · 12 of 83 slices shown, 14 images]
[im 5/83  soft-tissue]
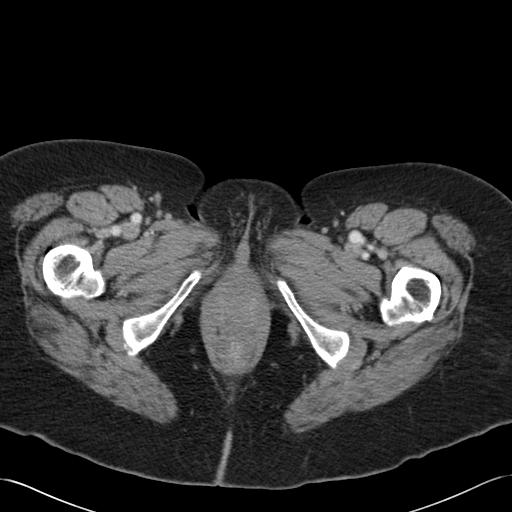
[im 5/83  bone]
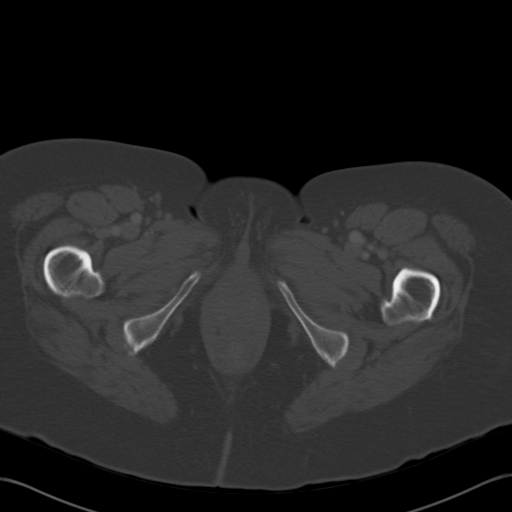
[im 15/83  soft-tissue]
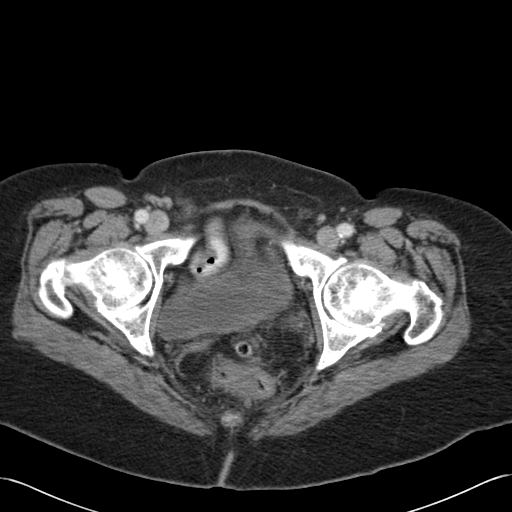
[im 20/83  soft-tissue]
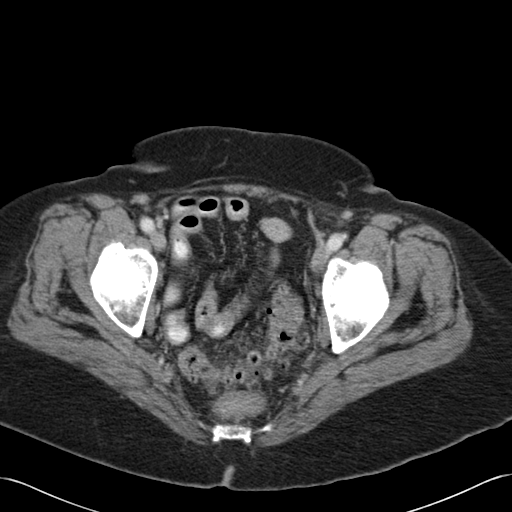
[im 25/83  soft-tissue]
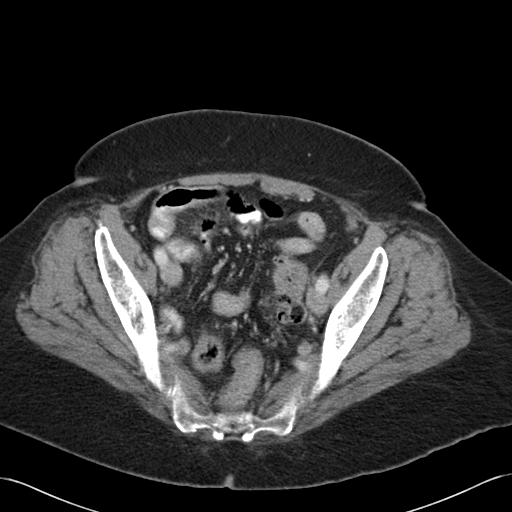
[im 34/83  soft-tissue]
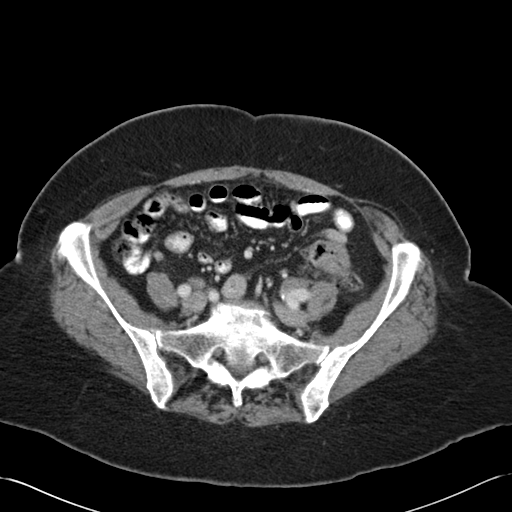
[im 39/83  soft-tissue]
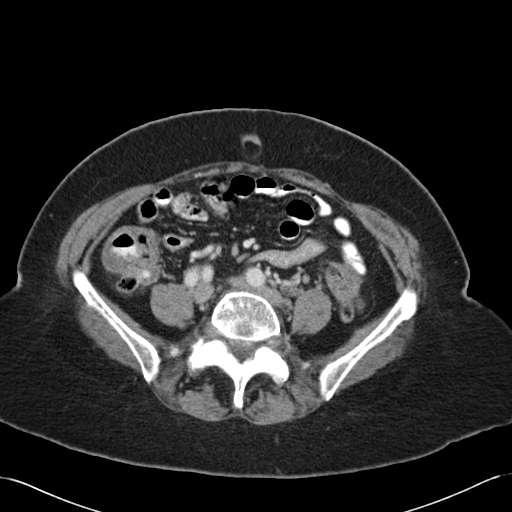
[im 44/83  soft-tissue]
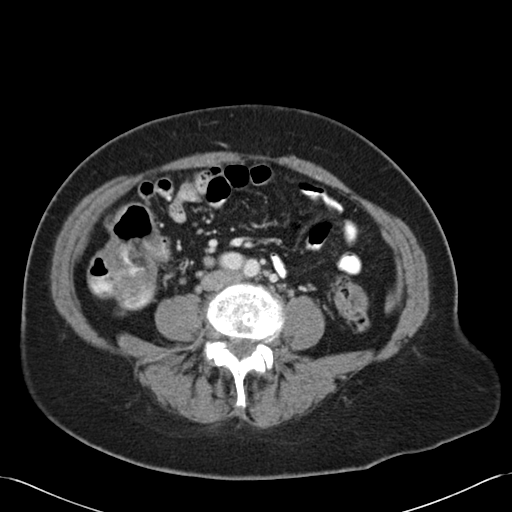
[im 54/83  soft-tissue]
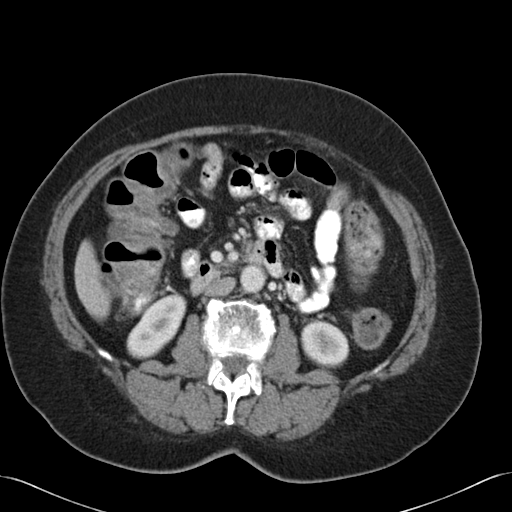
[im 58/83  soft-tissue]
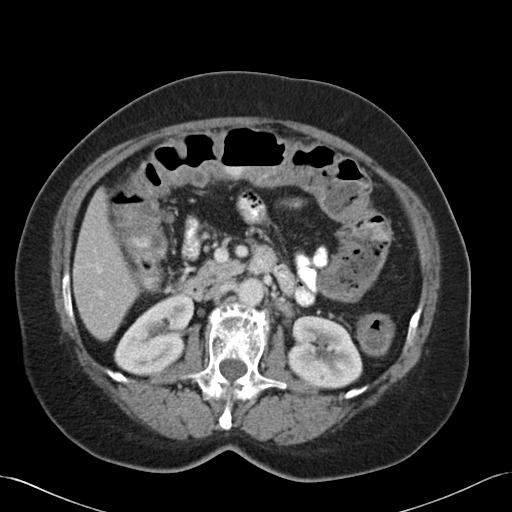
[im 58/83  bone]
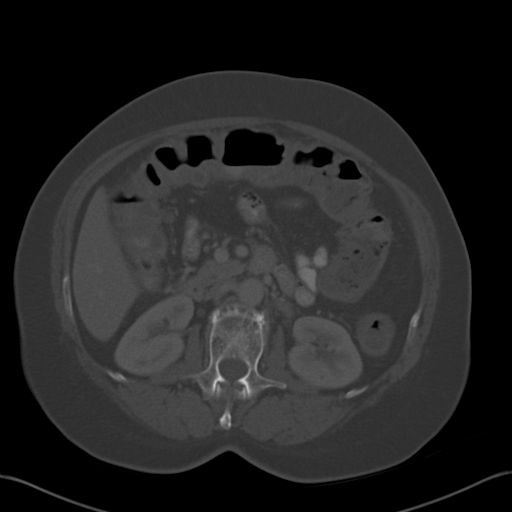
[im 63/83  soft-tissue]
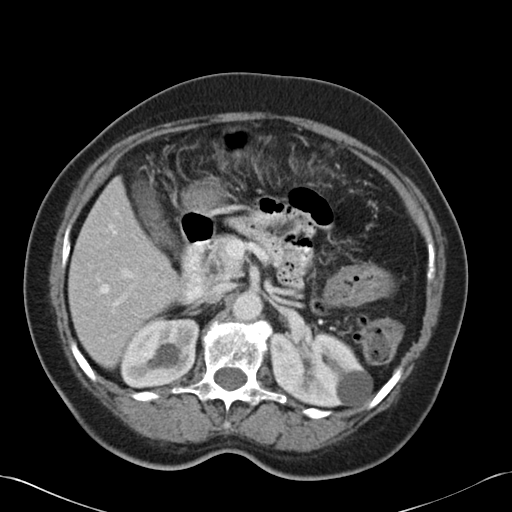
[im 73/83  soft-tissue]
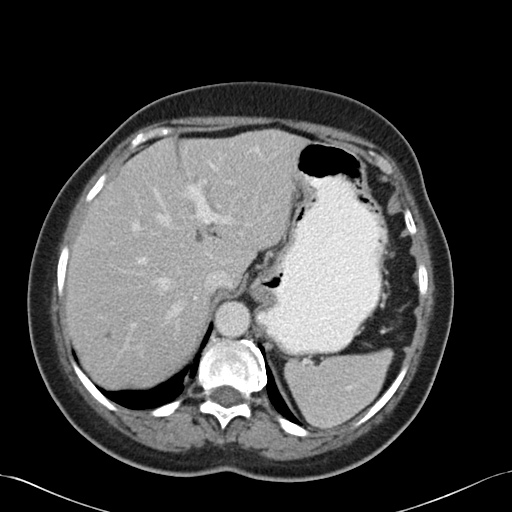
[im 78/83  soft-tissue]
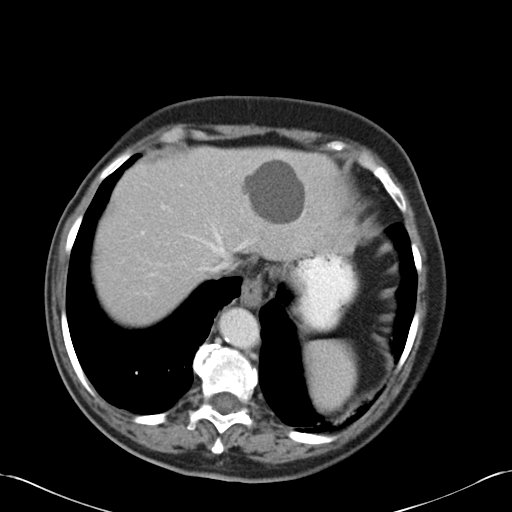

[Series 8: cor routine abd pel with · coronal · 0.61mm/px · 3 of 124 slices shown]
[im 42/124  soft-tissue]
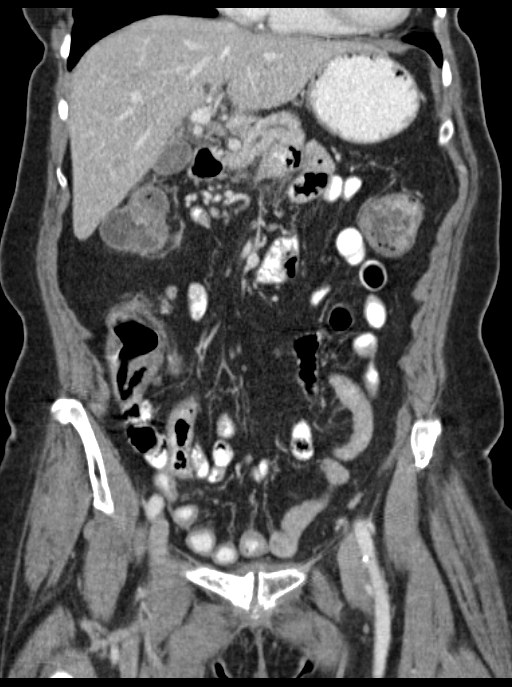
[im 55/124  soft-tissue]
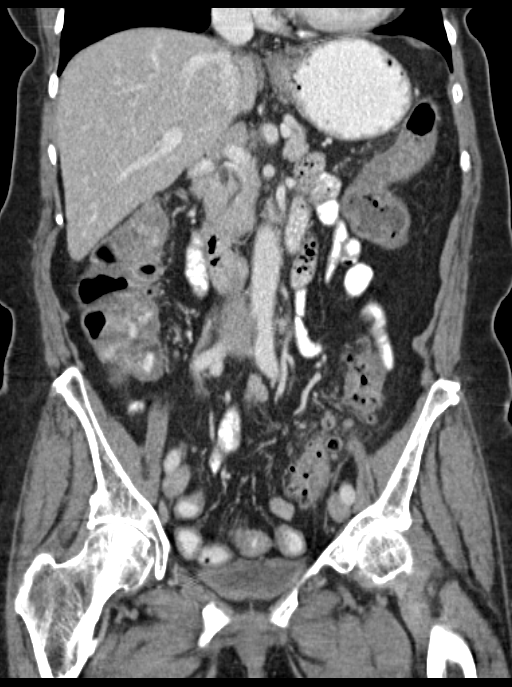
[im 69/124  soft-tissue]
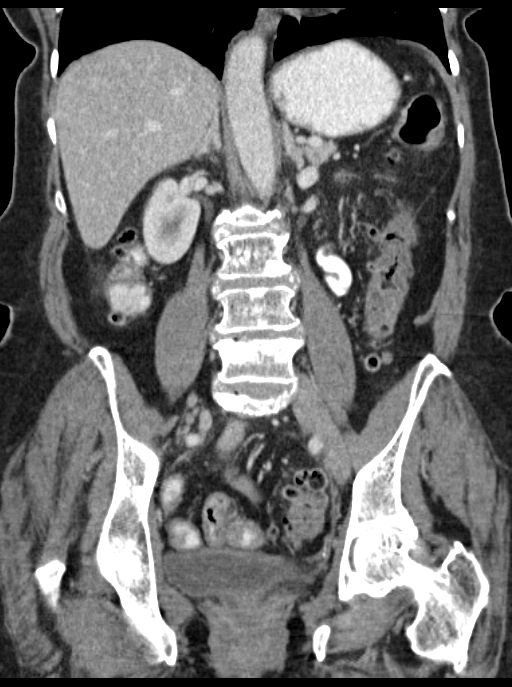

[15 of 46 positions shown; findings below may reference images not displayed]

FINDINGS: Lower chest:  No pleural effusion.  The lung bases are clear.

Hepatobiliary: Large cyst in the left lobe of liver is identified
measuring 4.4 cm, image number 6 of series 2. Small low attenuation
structure within the posterior right hepatic lobe is identified
which is too small to characterize measuring 5 mm. No suspicious
liver abnormalities noted. The gallbladder is normal. No biliary
dilatation.

Pancreas: Negative

Spleen: Negative

Adrenals/Urinary Tract: The adrenal glands are normal. Cyst arising
from the upper pole of left kidney measures 2.7 cm. Upper pole right
renal cyst measures 1.8 cm. The urinary bladder appears within
normal limits.

Stomach/Bowel: The stomach is within normal limits. The small bowel
loops have a normal course and caliber. No obstruction. There is
mild wall thickening involving the mid to distal small bowel loops.
Single wall thickness measures up to 8 mm, image 45/series 8. No
surrounding fat stranding. There is hyperemia of the Vasa recta.
Numerous distal colonic diverticula identified. No evidence for
acute diverticulitis.

Vascular/Lymphatic: Normal appearance of the abdominal aorta. No
enlarged retroperitoneal or mesenteric adenopathy. No enlarged
pelvic or inguinal lymph nodes.

Reproductive: The patient is status post hysterectomy. No adnexal
mass.

Other: No free fluid or fluid collections. No free intraperitoneal
air. Small umbilical hernia contains fat only.

Musculoskeletal: Multi level degenerative disc disease noted within
the lumbar spine.
IMPRESSION: 1. There is mild diffuse wall thickening involving the mid and
distal small bowel loops compatible with enteritis. This may be
inflammatory or infectious in etiology. No complicating features
identified. Specifically no evidence for penetrating disease, bowel
obstruction, or abscess formation.
2. Distal colonic diverticula without acute diverticulitis.
3. Liver cyst.

## 2016-11-14 DIAGNOSIS — H209 Unspecified iridocyclitis: Secondary | ICD-10-CM | POA: Diagnosis not present

## 2016-11-14 DIAGNOSIS — H3321 Serous retinal detachment, right eye: Secondary | ICD-10-CM | POA: Diagnosis not present

## 2016-11-14 DIAGNOSIS — H348322 Tributary (branch) retinal vein occlusion, left eye, stable: Secondary | ICD-10-CM | POA: Diagnosis not present

## 2016-11-14 DIAGNOSIS — H44112 Panuveitis, left eye: Secondary | ICD-10-CM | POA: Diagnosis not present

## 2016-11-14 DIAGNOSIS — H40022 Open angle with borderline findings, high risk, left eye: Secondary | ICD-10-CM | POA: Diagnosis not present

## 2016-11-24 DIAGNOSIS — Z1212 Encounter for screening for malignant neoplasm of rectum: Secondary | ICD-10-CM | POA: Diagnosis not present

## 2016-11-24 DIAGNOSIS — Z1211 Encounter for screening for malignant neoplasm of colon: Secondary | ICD-10-CM | POA: Diagnosis not present

## 2016-11-26 LAB — COLOGUARD

## 2017-01-20 ENCOUNTER — Telehealth: Payer: Self-pay | Admitting: Internal Medicine

## 2017-01-20 ENCOUNTER — Other Ambulatory Visit: Payer: Self-pay | Admitting: Internal Medicine

## 2017-01-20 DIAGNOSIS — M25552 Pain in left hip: Secondary | ICD-10-CM

## 2017-01-20 NOTE — Telephone Encounter (Signed)
Pt called stated having pain Rt hip, Rt back of the knee, and lower back and need referral to orthopedic

## 2017-01-26 DIAGNOSIS — M1612 Unilateral primary osteoarthritis, left hip: Secondary | ICD-10-CM | POA: Diagnosis not present

## 2017-01-26 DIAGNOSIS — M1711 Unilateral primary osteoarthritis, right knee: Secondary | ICD-10-CM | POA: Diagnosis not present

## 2017-01-26 DIAGNOSIS — M545 Low back pain: Secondary | ICD-10-CM | POA: Diagnosis not present

## 2017-03-13 DIAGNOSIS — H44431 Hypotony of eye due to other ocular disorders, right eye: Secondary | ICD-10-CM | POA: Diagnosis not present

## 2017-03-13 DIAGNOSIS — H3321 Serous retinal detachment, right eye: Secondary | ICD-10-CM | POA: Diagnosis not present

## 2017-03-13 DIAGNOSIS — H40022 Open angle with borderline findings, high risk, left eye: Secondary | ICD-10-CM | POA: Diagnosis not present

## 2017-03-13 DIAGNOSIS — H44112 Panuveitis, left eye: Secondary | ICD-10-CM | POA: Diagnosis not present

## 2017-03-13 DIAGNOSIS — H209 Unspecified iridocyclitis: Secondary | ICD-10-CM | POA: Diagnosis not present

## 2017-03-20 DIAGNOSIS — H40022 Open angle with borderline findings, high risk, left eye: Secondary | ICD-10-CM | POA: Diagnosis not present

## 2017-06-15 ENCOUNTER — Other Ambulatory Visit: Payer: Self-pay | Admitting: Internal Medicine

## 2017-06-15 DIAGNOSIS — E785 Hyperlipidemia, unspecified: Secondary | ICD-10-CM

## 2017-06-15 DIAGNOSIS — I10 Essential (primary) hypertension: Secondary | ICD-10-CM

## 2017-07-06 ENCOUNTER — Other Ambulatory Visit: Payer: Self-pay | Admitting: Internal Medicine

## 2017-07-06 DIAGNOSIS — Z1231 Encounter for screening mammogram for malignant neoplasm of breast: Secondary | ICD-10-CM

## 2017-07-31 DIAGNOSIS — H3321 Serous retinal detachment, right eye: Secondary | ICD-10-CM | POA: Diagnosis not present

## 2017-07-31 DIAGNOSIS — H34832 Tributary (branch) retinal vein occlusion, left eye, with macular edema: Secondary | ICD-10-CM | POA: Diagnosis not present

## 2017-07-31 DIAGNOSIS — H44431 Hypotony of eye due to other ocular disorders, right eye: Secondary | ICD-10-CM | POA: Diagnosis not present

## 2017-07-31 DIAGNOSIS — H209 Unspecified iridocyclitis: Secondary | ICD-10-CM | POA: Diagnosis not present

## 2017-07-31 DIAGNOSIS — H44112 Panuveitis, left eye: Secondary | ICD-10-CM | POA: Diagnosis not present

## 2017-08-17 ENCOUNTER — Ambulatory Visit
Admission: RE | Admit: 2017-08-17 | Discharge: 2017-08-17 | Disposition: A | Discharge status: Home / Self Care | Payer: Medicare Other | Source: Ambulatory Visit | Attending: Internal Medicine | Admitting: Internal Medicine

## 2017-08-17 DIAGNOSIS — Z1231 Encounter for screening mammogram for malignant neoplasm of breast: Secondary | ICD-10-CM | POA: Insufficient documentation

## 2017-10-05 ENCOUNTER — Ambulatory Visit: Payer: Medicare Other

## 2017-10-15 ENCOUNTER — Ambulatory Visit (INDEPENDENT_AMBULATORY_CARE_PROVIDER_SITE_OTHER): Payer: Medicare Other | Admitting: Internal Medicine

## 2017-10-15 ENCOUNTER — Encounter: Payer: Self-pay | Admitting: Internal Medicine

## 2017-10-15 VITALS — BP 136/80 | HR 66 | Ht 65.0 in | Wt 178.0 lb

## 2017-10-15 DIAGNOSIS — M170 Bilateral primary osteoarthritis of knee: Secondary | ICD-10-CM | POA: Insufficient documentation

## 2017-10-15 DIAGNOSIS — Z23 Encounter for immunization: Secondary | ICD-10-CM

## 2017-10-15 DIAGNOSIS — Z0000 Encounter for general adult medical examination without abnormal findings: Secondary | ICD-10-CM | POA: Diagnosis not present

## 2017-10-15 DIAGNOSIS — M1612 Unilateral primary osteoarthritis, left hip: Secondary | ICD-10-CM | POA: Diagnosis not present

## 2017-10-15 DIAGNOSIS — M5416 Radiculopathy, lumbar region: Secondary | ICD-10-CM | POA: Diagnosis not present

## 2017-10-15 DIAGNOSIS — M1710 Unilateral primary osteoarthritis, unspecified knee: Secondary | ICD-10-CM

## 2017-10-15 DIAGNOSIS — E782 Mixed hyperlipidemia: Secondary | ICD-10-CM | POA: Diagnosis not present

## 2017-10-15 DIAGNOSIS — I10 Essential (primary) hypertension: Secondary | ICD-10-CM

## 2017-10-15 LAB — POCT URINALYSIS DIPSTICK
Bilirubin, UA: NEGATIVE
Glucose, UA: NEGATIVE
KETONES UA: NEGATIVE
LEUKOCYTES UA: NEGATIVE
NITRITE UA: NEGATIVE
PROTEIN UA: NEGATIVE
RBC UA: NEGATIVE
SPEC GRAV UA: 1.01 (ref 1.010–1.025)
UROBILINOGEN UA: 0.2 U/dL
pH, UA: 6.5 (ref 5.0–8.0)

## 2017-10-15 MED ORDER — NAPROXEN 375 MG PO TBEC: 1.0000 | DELAYED_RELEASE_TABLET | Freq: Every day | ORAL | 5 refills | Status: DC | PRN

## 2017-10-15 NOTE — Patient Instructions (Addendum)
Health Maintenance  Topic Date Due  . INFLUENZA VACCINE  07/29/2018  . OPHTHALMOLOGY EXAM  07/31/2018  . TETANUS/TDAP  07/10/2024  . DEXA SCAN  Completed  . PNA vac Low Risk Adult  Completed  . FOOT EXAM  Excluded  . HEMOGLOBIN A1C  Excluded  . URINE MICROALBUMIN  Excluded   Pneumococcal Conjugate Vaccine (PCV13) What You Need to Know 1. Why get vaccinated? Vaccination can protect both children and adults from pneumococcal disease. Pneumococcal disease is caused by bacteria that can spread from person to person through close contact. It can cause ear infections, and it can also lead to more serious infections of the:  Lungs (pneumonia),  Blood (bacteremia), and  Covering of the brain and spinal cord (meningitis).  Pneumococcal pneumonia is most common among adults. Pneumococcal meningitis can cause deafness and brain damage, and it kills about 1 child in 10 who get it. Anyone can get pneumococcal disease, but children under 24 years of age and adults 56 years and older, people with certain medical conditions, and cigarette smokers are at the highest risk. Before there was a vaccine, the [place] saw:  more than 700 cases of meningitis,  about 13,000 blood infections,  about 5 million ear infections, and  about 200 deaths  in children under 5 each year from pneumococcal disease. Since vaccine became available, severe pneumococcal disease in these children has fallen by 88%. About 18,000 older adults die of pneumococcal disease each year in the [place]. Treatment of pneumococcal infections with penicillin and other drugs is not as effective as it used to be, because some strains of the disease have become resistant to these drugs. This makes prevention of the disease, through vaccination, even more important. 2. PCV13 vaccine Pneumococcal conjugate vaccine (called PCV13) protects against 13 types of pneumococcal bacteria. PCV13 is routinely given to children at 2, 4, 6,  and 30-58 months of age. It is also recommended for children and adults 60 to 56 years of age with certain health conditions, and for all adults 70 years of age and older. Your doctor can give you details. 3. Some people should not get this vaccine Anyone who has ever had a life-threatening allergic reaction to a dose of this vaccine, to an earlier pneumococcal vaccine called PCV7, or to any vaccine containing diphtheria toxoid (for example, DTaP), should not get PCV13. Anyone with a severe allergy to any component of PCV13 should not get the vaccine. Tell your doctor if the person being vaccinated has any severe allergies. If the person scheduled for vaccination is not feeling well, your healthcare provider might decide to reschedule the shot on another day. 4. Risks of a vaccine reaction With any medicine, including vaccines, there is a chance of reactions. These are usually mild and go away on their own, but serious reactions are also possible. Problems reported following PCV13 varied by age and dose in the series. The most common problems reported among children were:  About half became drowsy after the shot, had a temporary loss of appetite, or had redness or tenderness where the shot was given.  About 1 out of 3 had swelling where the shot was given.  About 1 out of 3 had a mild fever, and about 1 in 20 had a fever over 102.67F.  Up to about 8 out of 10 became fussy or irritable.  Adults have reported pain, redness, and swelling where the shot was given; also mild fever, fatigue, headache, chills, or muscle pain.  Young children who get PCV13 along with inactivated flu vaccine at the same time may be at increased risk for seizures caused by fever. Ask your doctor for more information. Problems that could happen after any vaccine:  People sometimes faint after a medical procedure, including vaccination. Sitting or lying down for about 15 minutes can help prevent fainting, and injuries caused  by a fall. Tell your doctor if you feel dizzy, or have vision changes or ringing in the ears.  Some older children and adults get severe pain in the shoulder and have difficulty moving the arm where a shot was given. This happens very rarely.  Any medication can cause a severe allergic reaction. Such reactions from a vaccine are very rare, estimated at about 1 in a million doses, and would happen within a few minutes to a few hours after the vaccination. As with any medicine, there is a very small chance of a vaccine causing a serious injury or death. The safety of vaccines is always being monitored. For more information, visit: http://www.aguilar.org/ 5. What if there is a serious reaction? What should I look for? Look for anything that concerns you, such as signs of a severe allergic reaction, very high fever, or unusual behavior. Signs of a severe allergic reaction can include hives, swelling of the face and throat, difficulty breathing, a fast heartbeat, dizziness, and weakness-usually within a few minutes to a few hours after the vaccination. What should I do?  If you think it is a severe allergic reaction or other emergency that can't wait, call 9-1-1 or get the person to the nearest hospital. Otherwise, call your doctor.  Reactions should be reported to the Vaccine Adverse Event Reporting System (VAERS). Your doctor should file this report, or you can do it yourself through the VAERS web site at www.vaers.SamedayNews.es, or by calling 830-595-2183. ? VAERS does not give medical advice. 6. The National Vaccine Injury Compensation Program The [employer] Vaccine Injury Compensation Program (VICP) is a federal program that was created to compensate people who may have been injured by certain vaccines. Persons who believe they may have been injured by a vaccine can learn about the program and about filing a claim by calling 934-551-4676 or visiting the [place] website at  GoldCloset.com.ee. There is a time limit to file a claim for compensation. 7. How can I learn more?  Ask your healthcare provider. He or she can give you the vaccine package insert or suggest other sources of information.  Call your local or state health department.  Contact the Centers for Disease Control and Prevention (CDC): ? Call (310)888-5812 (1-800-CDC-INFO) or ? Visit CDC's website at http://hunter.com/ Vaccine Information Statement, PCV13 Vaccine (11/02/2014) This information is not intended to replace advice given to you by your health care provider. Make sure you discuss any questions you have with your health care provider. Document Released: 10/12/2006 Document Revised: 09/04/2016 Document Reviewed: 09/04/2016 Elsevier Interactive Patient Education  2017 [employer].

## 2017-10-15 NOTE — Progress Notes (Signed)
Patient: Kristy Sellers, Female    DOB: January 05, 1941, 76 y.o.   MRN: [id number] Visit Date: 10/15/2017  Today's Provider: Halina Maidens, MD   Chief Complaint  Patient presents with  . Medicare Wellness    Breast Exam. Flu Vaccine.   . Knee Pain    Would like to discuss getting naproxen for when right leg tightens up. Also, wants to see if can get handicap sticker. Wants to know if need referral. Pain lasts for 15 mins. And then goes away.    . Hyperlipidemia  . Hypertension   Subjective:    Annual wellness visit Kristy Sellers is a 76 y.o. female who presents today for her Subsequent Annual Wellness Visit. She feels fairly well. She reports exercising only walking. She reports she is sleeping well.  She denies breast problems.  Mammogram was done in August and was normal.  She has had one pneumonia vaccine unsp.  Likely needs Prevnar-13. ----------------------------------------------------------- He complains of intermittent pain and locking of the right knee. However on closer questioning, it appears the symptoms are actually related to sciatica with a shooting pain from her knee down to the top of her foot.She was seen by orthopedics in January. X-ray showed bone-on-bone arthritis of the left hip, advanced arthritis of the right knee, and lumbar osteoarthritis with bone spurring and disc degeneration. She declined physical therapy. She's been taking naproxen 375 as needed with good relief of symptoms.she is not interested in further orthopedic evaluation at this time.   Knee Pain   There was no injury mechanism. The pain is present in the right knee. The pain is moderate. The pain has been fluctuating since onset. Associated symptoms include a loss of motion. Pertinent negatives include no numbness. She has tried NSAIDs for the symptoms. The treatment provided mild relief.  Hyperlipidemia  This is a chronic problem. The problem is controlled. She has no history of diabetes. There are no  known factors aggravating her hyperlipidemia. Pertinent negatives include no chest pain or shortness of breath. Current antihyperlipidemic treatment includes statins.  Hypertension  This is a chronic problem. The problem is controlled. Pertinent negatives include no chest pain, headaches, palpitations or shortness of breath. Past treatments include calcium channel blockers.    Review of Systems  Constitutional: Negative for chills, fatigue and fever.  HENT: Negative for congestion, hearing loss, tinnitus, trouble swallowing and voice change.   Eyes: Negative for visual disturbance.  Respiratory: Negative for cough, chest tightness, shortness of breath and wheezing.   Cardiovascular: Negative for chest pain, palpitations and leg swelling.  Gastrointestinal: Negative for abdominal pain, constipation and diarrhea.  Endocrine: Negative for polydipsia and polyuria.  Genitourinary: Negative for dysuria, frequency, genital sores and vaginal bleeding.  Musculoskeletal: Positive for arthralgias (knee pain). Negative for gait problem and joint swelling.  Skin: Negative for color change and rash.  Neurological: Negative for dizziness, tremors, weakness, light-headedness, numbness and headaches.  Hematological: Negative for adenopathy. Does not bruise/bleed easily.  Psychiatric/Behavioral: Negative for dysphoric mood and sleep disturbance. The patient is not nervous/anxious.     Social History   Social History  . Marital status: Married    Spouse name: N/A  . Number of children: N/A  . Years of education: N/A   Occupational History  . Not on file.   Social History Main Topics  . Smoking status: Former Smoker    Types: Cigarettes    Quit date: 12/30/1983  . Smokeless tobacco: Never Used  . Alcohol use No  .  Drug use: Unknown  . Sexual activity: Not on file   Other Topics Concern  . Not on file   Social History Narrative   Married, retired - volunteers at [employer]    Patient  Active Problem List   Diagnosis Date Noted  . Hyperlipidemia 06/11/2016  . Blind right eye 06/11/2016  . History of Clostridium difficile colitis 06/11/2016  . History of positive PPD 06/11/2016  . Detached retina 05/18/2016  . Pseudoaphakia 05/18/2016  . 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 Ocular hypertension 05/18/2016  . Cataract 05/18/2016  . 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 Essential hypertension 09/27/2014    Past Surgical History:  Procedure Laterality Date  . ABDOMINAL HYSTERECTOMY    . COLONOSCOPY  ~2006   normal  . RETINAL DETACHMENT SURGERY Right     Her family history includes CAD in her father and son; Parkinson's disease in her mother.     Previous Medications   AMLODIPINE (NORVASC) 5 MG TABLET    TAKE 1 TABLET BY MOUTH DAILY   ASPIRIN 81 MG TABLET    Take 81 mg by mouth daily.   HOMATROPAIRE 5 % OPHTHALMIC SOLUTION    Apply 1 drop to eye 4 (four) times daily. Place in right eye   LANCETS (ONETOUCH ULTRASOFT) LANCETS       MELOXICAM (MOBIC) 7.5 MG TABLET    Take 7.5 mg by mouth daily.   PREDNISOLONE ACETATE (PRED FORTE) 1 % OPHTHALMIC SUSPENSION    Apply 1 drop to eye 4 (four) times daily.   SIMVASTATIN (ZOCOR) 10 MG TABLET    TAKE 1 TABLET BY MOUTH AT BEDTIME    Patient Care Team: Glean Hess, MD as PCP - General (Family Medicine)      Objective:   Vitals: BP 136/80   Pulse 66   Ht 5\' 5"  (1.651 m)   Wt 178 lb (80.7 kg)   SpO2 99%   BMI 29.62 kg/m   Physical Exam  Constitutional: She is oriented to person, place, and time. She appears well-developed and well-nourished. No distress.  HENT:  Head: Normocephalic and atraumatic.  Right Ear: Tympanic membrane and ear canal normal.  Left Ear: Tympanic membrane and ear canal normal.  Nose: Right sinus exhibits no maxillary sinus tenderness. Left sinus exhibits no maxillary sinus tenderness.  Mouth/Throat: Uvula is midline and oropharynx is clear and moist.  Eyes:  Conjunctivae are normal. Right eye exhibits discharge. Left eye exhibits no discharge. No scleral icterus. Left eye exhibits normal extraocular motion and no nystagmus.  Neck: Normal range of motion. Carotid bruit is not present. No erythema present. No thyromegaly present.  Cardiovascular: Normal rate, regular rhythm, normal heart sounds and normal pulses.   Pulmonary/Chest: Effort normal. No respiratory distress. She has no wheezes. Right breast exhibits no mass, no nipple discharge, no skin change and no tenderness. Left breast exhibits no mass, no nipple discharge, no skin change and no tenderness.  Abdominal: Soft. Bowel sounds are normal. There is no hepatosplenomegaly. There is no tenderness. There is no CVA tenderness.  Musculoskeletal: Normal range of motion. She exhibits no edema, tenderness or deformity.  Lymphadenopathy:    She has no cervical adenopathy.    She has no axillary adenopathy.  Neurological: She is alert and oriented to person, place, and time. She has normal reflexes. No cranial nerve deficit or sensory deficit.  Skin: Skin is warm, dry and intact. No rash noted.  Psychiatric: She has a normal mood  and affect. Her speech is normal and behavior is normal. Thought content normal.  Nursing note and vitals reviewed.   Activities of Daily Living In your present state of health, do you have any difficulty performing the following activities: 10/15/2017  Hearing? N  Vision? N  Difficulty concentrating or making decisions? N  Walking or climbing stairs? Y  Dressing or bathing? N  Doing errands, shopping? N  Preparing Food and eating ? N  Using the Toilet? N  In the past six months, have you accidently leaked urine? N  Do you have problems with loss of bowel control? N  Managing your Medications? N  Managing your Finances? N  Housekeeping or managing your Housekeeping? N  Some recent data might be hidden    Fall Risk Assessment Fall Risk  10/15/2017 10/14/2016  09/16/2016 06/11/2016  Falls in the past year? No No No No     Depression Screen PHQ 2/9 Scores 10/15/2017 10/14/2016 09/16/2016 06/11/2016  PHQ - 2 Score 0 0 0 1  PHQ- 9 Score - - 4 -    6CIT Screen 10/15/2017 10/14/2016  What Year? 0 points 0 points  What month? 0 points 0 points  What time? 0 points 0 points  Count back from 20 0 points 0 points  Months in reverse 0 points 0 points  Repeat phrase 4 points 0 points  Total Score 4 0    Medicare Annual Wellness Visit Summary:  Reviewed patient's Family Medical History Reviewed and updated list of patient's medical providers Assessment of cognitive impairment was done Assessed patient's functional ability Established a written schedule for health screening [place] Completed and Reviewed  Exercise Activities and Dietary recommendations Goals    None      Immunization History  Administered Date(s) Administered  . Influenza,inj,Quad PF,6+ Mos 10/14/2016  . Pneumococcal-Unspecified 12/29/2013  . Tdap 07/10/2014  . Zoster 09/28/2013    Health Maintenance  Topic Date Due  . INFLUENZA VACCINE  07/29/2017  . OPHTHALMOLOGY EXAM  07/31/2018  . TETANUS/TDAP  07/10/2024  . DEXA SCAN  Completed  . PNA vac Low Risk Adult  Completed  . FOOT EXAM  Excluded  . HEMOGLOBIN A1C  Excluded  . URINE MICROALBUMIN  Excluded    Discussed health benefits of physical activity, and encouraged her to engage in regular exercise appropriate for her age and condition.    ------------------------------------------------------------------------------------------------------------  Assessment & Plan:   1. Medicare annual wellness visit, subsequent Measures satisfied - POCT urinalysis dipstick  2. Essential hypertension controlled - CBC with Differential/Platelet - Comprehensive metabolic panel - TSH  3. Mixed hyperlipidemia On statin therapy - Lipid panel  4. Need for influenza vaccination - Flu Vaccine QUAD  36+ mos IM  5. Need for pneumococcal vaccination - Pneumococcal conjugate vaccine 13-valent IM  6. Chronic lumbar radiculopathy Discussed indications for follow up with Ortho May take naproxen PRN  7. Arthritis of knee - Naproxen 375 MG TBEC; Take 1 tablet (375 mg total) by mouth daily as needed.  Dispense: 60 each; Refill: 5  8. Primary osteoarthritis of left hip Bone on bone by xray but not currently limiting activity   Meds ordered this encounter  Medications  . Naproxen 375 MG TBEC    Sig: Take 1 tablet (375 mg total) by mouth daily as needed.    Dispense:  60 each    Refill:  5    Partially dictated using Editor, commissioning. Any errors are unintentional.  Halina Maidens, MD [facility]  [facility]  10/15/2017

## 2017-10-16 LAB — COMPREHENSIVE METABOLIC PANEL
A/G RATIO: 1.6 (ref 1.2–2.2)
ALK PHOS: 89 IU/L (ref 39–117)
ALT: 14 IU/L (ref 0–32)
AST: 21 IU/L (ref 0–40)
Albumin: 4.4 g/dL (ref 3.5–4.8)
BILIRUBIN TOTAL: 0.5 mg/dL (ref 0.0–1.2)
BUN/Creatinine Ratio: 14 (ref 12–28)
BUN: 12 mg/dL (ref 8–27)
CHLORIDE: 104 mmol/L (ref 96–106)
CO2: 26 mmol/L (ref 20–29)
Calcium: 9.3 mg/dL (ref 8.7–10.3)
Creatinine, Ser: 0.84 mg/dL (ref 0.57–1.00)
GFR calc Af Amer: 78 mL/min/{1.73_m2} (ref >59)
GFR calc non Af Amer: 68 mL/min/{1.73_m2} (ref >59)
GLUCOSE: 84 mg/dL (ref 65–99)
Globulin, Total: 2.8 g/dL (ref 1.5–4.5)
POTASSIUM: 4.1 mmol/L (ref 3.5–5.2)
SODIUM: 147 mmol/L — AB (ref 134–144)
Total Protein: 7.2 g/dL (ref 6.0–8.5)

## 2017-10-16 LAB — CBC WITH DIFFERENTIAL/PLATELET
BASOS ABS: 0 10*3/uL (ref 0.0–0.2)
BASOS: 0 %
EOS (ABSOLUTE): 0.2 10*3/uL (ref 0.0–0.4)
Eos: 3 %
Hematocrit: 39.3 % (ref 34.0–46.6)
Hemoglobin: 12.8 g/dL (ref 11.1–15.9)
Immature Grans (Abs): 0 10*3/uL (ref 0.0–0.1)
Immature Granulocytes: 0 %
Lymphocytes Absolute: 1.6 10*3/uL (ref 0.7–3.1)
Lymphs: 19 %
MCH: 25.5 pg — AB (ref 26.6–33.0)
MCHC: 32.6 g/dL (ref 31.5–35.7)
MCV: 78 fL — AB (ref 79–97)
MONOS ABS: 0.6 10*3/uL (ref 0.1–0.9)
Monocytes: 8 %
NEUTROS ABS: 5.9 10*3/uL (ref 1.4–7.0)
Neutrophils: 70 %
Platelets: 255 10*3/uL (ref 150–379)
RBC: 5.02 x10E6/uL (ref 3.77–5.28)
RDW: 16.7 % — AB (ref 12.3–15.4)
WBC: 8.4 10*3/uL (ref 3.4–10.8)

## 2017-10-16 LAB — LIPID PANEL
CHOLESTEROL TOTAL: 164 mg/dL (ref 100–199)
Chol/HDL Ratio: 2.2 ratio (ref 0.0–4.4)
HDL: 73 mg/dL (ref >39)
LDL Calculated: 78 mg/dL (ref 0–99)
TRIGLYCERIDES: 65 mg/dL (ref 0–149)
VLDL Cholesterol Cal: 13 mg/dL (ref 5–40)

## 2017-10-16 LAB — TSH: TSH: 3.7 u[IU]/mL (ref 0.450–4.500)

## 2017-12-11 DIAGNOSIS — H44112 Panuveitis, left eye: Secondary | ICD-10-CM | POA: Diagnosis not present

## 2017-12-11 DIAGNOSIS — H34832 Tributary (branch) retinal vein occlusion, left eye, with macular edema: Secondary | ICD-10-CM | POA: Diagnosis not present

## 2017-12-11 DIAGNOSIS — H3321 Serous retinal detachment, right eye: Secondary | ICD-10-CM | POA: Diagnosis not present

## 2017-12-11 DIAGNOSIS — H209 Unspecified iridocyclitis: Secondary | ICD-10-CM | POA: Diagnosis not present

## 2017-12-11 DIAGNOSIS — H40022 Open angle with borderline findings, high risk, left eye: Secondary | ICD-10-CM | POA: Diagnosis not present

## 2017-12-11 DIAGNOSIS — H44431 Hypotony of eye due to other ocular disorders, right eye: Secondary | ICD-10-CM | POA: Diagnosis not present

## 2018-04-15 ENCOUNTER — Encounter: Payer: Self-pay | Admitting: Internal Medicine

## 2018-04-15 ENCOUNTER — Ambulatory Visit (INDEPENDENT_AMBULATORY_CARE_PROVIDER_SITE_OTHER): Payer: Medicare Other | Admitting: Internal Medicine

## 2018-04-15 VITALS — BP 128/76 | HR 76 | Ht 65.0 in | Wt 172.0 lb

## 2018-04-15 DIAGNOSIS — I10 Essential (primary) hypertension: Secondary | ICD-10-CM | POA: Diagnosis not present

## 2018-04-15 DIAGNOSIS — M5416 Radiculopathy, lumbar region: Secondary | ICD-10-CM | POA: Diagnosis not present

## 2018-04-15 DIAGNOSIS — E782 Mixed hyperlipidemia: Secondary | ICD-10-CM | POA: Diagnosis not present

## 2018-04-15 DIAGNOSIS — K219 Gastro-esophageal reflux disease without esophagitis: Secondary | ICD-10-CM

## 2018-04-15 MED ORDER — RANITIDINE HCL 150 MG PO TABS: 150.0000 mg | ORAL_TABLET | Freq: Two times a day (BID) | ORAL | 1 refills | Status: DC

## 2018-04-15 NOTE — Patient Instructions (Signed)
Try Tumeric for knee joint pain

## 2018-04-15 NOTE — Progress Notes (Signed)
Date:  04/15/2018   Name:  Kristy Sellers   DOB:  15-Feb-1941   MRN:  [id number]   Chief Complaint: Hypertension (f/up) and Osteoarthritis Hypertension  This is a chronic problem. The problem is controlled. Pertinent negatives include no chest pain, headaches, palpitations or shortness of breath. Past treatments include calcium channel blockers. The current treatment provides significant improvement.  Hyperlipidemia  This is a chronic problem. Pertinent negatives include no chest pain or shortness of breath. Current antihyperlipidemic treatment includes statins.  Back Pain  This is a chronic problem. The problem is unchanged. The pain is present in the lumbar spine. Pertinent negatives include no chest pain, fever or headaches.  Gastroesophageal Reflux  She complains of choking, globus sensation and heartburn. She reports no chest pain or no wheezing. This is a recurrent problem. The current episode started 1 to 4 weeks ago. The problem occurs frequently. The heartburn is of moderate intensity. The heartburn does not wake her from sleep. The symptoms are aggravated by certain foods. Pertinent negatives include no fatigue. She has tried an antacid for the symptoms.      Review of Systems  Constitutional: Negative for chills, fatigue and fever.  Respiratory: Positive for choking. Negative for chest tightness, shortness of breath and wheezing.   Cardiovascular: Negative for chest pain and palpitations.  Gastrointestinal: Positive for heartburn.  Musculoskeletal: Positive for arthralgias (knees improved) and back pain.  Neurological: Negative for dizziness and headaches.  Hematological: Negative for adenopathy.    Patient Active Problem List   Diagnosis Date Noted  . Chronic lumbar radiculopathy 10/15/2017  . Arthritis of knee 10/15/2017  . Primary osteoarthritis of left hip 10/15/2017  . Hyperlipidemia 06/11/2016  . Blind right eye 06/11/2016  . History of Clostridium difficile  colitis 06/11/2016  . History of positive PPD 06/11/2016  . Detached retina 05/18/2016  . Pseudoaphakia 05/18/2016  . 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 Ocular hypertension 05/18/2016  . Cataract 05/18/2016  . 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 Essential hypertension 09/27/2014    Prior to Admission medications   Medication Sig Start Date End Date Taking? Authorizing Provider  amLODipine (NORVASC) 5 MG tablet TAKE 1 TABLET BY MOUTH DAILY 06/15/17   Glean Hess, MD  aspirin 81 MG tablet Take 81 mg by mouth daily.    [provider]  atropine 1 % ophthalmic solution PLACE 1 DROP INTO THE RIGHT EYE ONCE DAILY 04/07/18   [provider]  HOMATROPAIRE 5 % ophthalmic solution Apply 1 drop to eye 4 (four) times daily. Place in right eye    [provider]  Lancets Glory Rosebush ULTRASOFT) lancets  07/10/14   [provider]  Naproxen 375 MG TBEC Take 1 tablet (375 mg total) by mouth daily as needed. 10/15/17   Glean Hess, MD  prednisoLONE acetate (PRED FORTE) 1 % ophthalmic suspension Apply 1 drop to eye 4 (four) times daily. 10/19/15   [provider]  simvastatin (ZOCOR) 10 MG tablet TAKE 1 TABLET BY MOUTH AT BEDTIME 06/15/17   Glean Hess, MD    No Known Allergies  Past Surgical History:  Procedure Laterality Date  . ABDOMINAL HYSTERECTOMY    . COLONOSCOPY  ~2006   normal  . RETINAL DETACHMENT SURGERY Right     Social History   Tobacco Use  . Smoking status: Former Smoker    Types: Cigarettes    Last attempt to quit: 12/30/1983    Years since quitting:  34.3  . Smokeless tobacco: Never Used  Substance Use Topics  . Alcohol use: No    Alcohol/week: 0.0 oz  . Drug use: Not on file     Medication list has been reviewed and updated.  PHQ 2/9 Scores 10/15/2017 10/14/2016 09/16/2016 06/11/2016  PHQ - 2 Score 0 0 0 1  PHQ- 9 Score - - 4 -    Physical Exam  Constitutional: She is oriented to  person, place, and time. She appears well-developed. No distress.  HENT:  Head: Normocephalic and atraumatic.  Neck: Normal range of motion. Neck supple. Carotid bruit is not present.  Cardiovascular: Normal rate, regular rhythm and normal heart sounds.  Pulmonary/Chest: Effort normal and breath sounds normal. No respiratory distress.  Abdominal: Soft. Bowel sounds are normal. She exhibits no mass. There is no tenderness. There is no guarding.  Musculoskeletal: She exhibits no edema.  Neurological: She is alert and oriented to person, place, and time.  Skin: Skin is warm and dry. No rash noted.  Psychiatric: She has a normal mood and affect. Her speech is normal and behavior is normal. Thought content normal.    BP 128/76   Pulse 76   Ht 5\' 5"  (1.651 m)   Wt 172 lb (78 kg)   SpO2 98%   BMI 28.62 kg/m   Assessment and Plan: 1. Essential hypertension controlled  2. Mixed hyperlipidemia Doing well on statin therapy  3. Chronic lumbar radiculopathy With OA knees as well - continue nsaids as needed Consider trial of Tumeric  4. Gastroesophageal reflux disease, esophagitis presence not specified Begin H2blocker - taper as sx allow Follow up if sx persist - ranitidine (ZANTAC) 150 MG tablet; Take 1 tablet (150 mg total) by mouth 2 (two) times daily.  Dispense: 180 tablet; Refill: 1   Meds ordered this encounter  Medications  . ranitidine (ZANTAC) 150 MG tablet    Sig: Take 1 tablet (150 mg total) by mouth 2 (two) times daily.    Dispense:  180 tablet    Refill:  1    Partially dictated using Editor, commissioning. Any errors are unintentional.  Halina Maidens, MD [facility]  04/15/2018

## 2018-04-30 DIAGNOSIS — H3321 Serous retinal detachment, right eye: Secondary | ICD-10-CM | POA: Diagnosis not present

## 2018-04-30 DIAGNOSIS — H34832 Tributary (branch) retinal vein occlusion, left eye, with macular edema: Secondary | ICD-10-CM | POA: Diagnosis not present

## 2018-04-30 DIAGNOSIS — H44112 Panuveitis, left eye: Secondary | ICD-10-CM | POA: Diagnosis not present

## 2018-04-30 DIAGNOSIS — H209 Unspecified iridocyclitis: Secondary | ICD-10-CM | POA: Diagnosis not present

## 2018-06-04 ENCOUNTER — Other Ambulatory Visit: Payer: Self-pay | Admitting: Internal Medicine

## 2018-06-04 DIAGNOSIS — I10 Essential (primary) hypertension: Secondary | ICD-10-CM

## 2018-06-04 DIAGNOSIS — E785 Hyperlipidemia, unspecified: Secondary | ICD-10-CM

## 2018-08-05 DIAGNOSIS — H18421 Band keratopathy, right eye: Secondary | ICD-10-CM | POA: Diagnosis not present

## 2018-09-17 ENCOUNTER — Ambulatory Visit (INDEPENDENT_AMBULATORY_CARE_PROVIDER_SITE_OTHER): Payer: Medicare Other

## 2018-09-17 DIAGNOSIS — Z23 Encounter for immunization: Secondary | ICD-10-CM

## 2018-09-29 ENCOUNTER — Other Ambulatory Visit: Payer: Self-pay | Admitting: Internal Medicine

## 2018-09-29 DIAGNOSIS — Z1231 Encounter for screening mammogram for malignant neoplasm of breast: Secondary | ICD-10-CM

## 2018-10-01 DIAGNOSIS — H18421 Band keratopathy, right eye: Secondary | ICD-10-CM | POA: Insufficient documentation

## 2018-10-01 DIAGNOSIS — H40022 Open angle with borderline findings, high risk, left eye: Secondary | ICD-10-CM | POA: Diagnosis not present

## 2018-10-01 DIAGNOSIS — H44112 Panuveitis, left eye: Secondary | ICD-10-CM | POA: Diagnosis not present

## 2018-10-01 DIAGNOSIS — H3321 Serous retinal detachment, right eye: Secondary | ICD-10-CM | POA: Diagnosis not present

## 2018-10-01 DIAGNOSIS — H34832 Tributary (branch) retinal vein occlusion, left eye, with macular edema: Secondary | ICD-10-CM | POA: Diagnosis not present

## 2018-10-01 DIAGNOSIS — H209 Unspecified iridocyclitis: Secondary | ICD-10-CM | POA: Diagnosis not present

## 2018-10-01 DIAGNOSIS — H44431 Hypotony of eye due to other ocular disorders, right eye: Secondary | ICD-10-CM | POA: Diagnosis not present

## 2018-10-05 ENCOUNTER — Ambulatory Visit
Admission: RE | Admit: 2018-10-05 | Discharge: 2018-10-05 | Disposition: A | Discharge status: Home / Self Care | Payer: Medicare Other | Source: Ambulatory Visit | Attending: Internal Medicine | Admitting: Internal Medicine

## 2018-10-05 DIAGNOSIS — Z1231 Encounter for screening mammogram for malignant neoplasm of breast: Secondary | ICD-10-CM

## 2018-10-18 ENCOUNTER — Ambulatory Visit: Payer: Medicare Other

## 2018-10-19 ENCOUNTER — Encounter: Payer: Self-pay | Admitting: Internal Medicine

## 2018-10-19 ENCOUNTER — Ambulatory Visit (INDEPENDENT_AMBULATORY_CARE_PROVIDER_SITE_OTHER): Payer: Medicare Other | Admitting: Internal Medicine

## 2018-10-19 VITALS — BP 124/70 | HR 71 | Ht 65.0 in | Wt 174.0 lb

## 2018-10-19 DIAGNOSIS — E782 Mixed hyperlipidemia: Secondary | ICD-10-CM

## 2018-10-19 DIAGNOSIS — I10 Essential (primary) hypertension: Secondary | ICD-10-CM | POA: Diagnosis not present

## 2018-10-19 DIAGNOSIS — K219 Gastro-esophageal reflux disease without esophagitis: Secondary | ICD-10-CM

## 2018-10-19 DIAGNOSIS — M1710 Unilateral primary osteoarthritis, unspecified knee: Secondary | ICD-10-CM | POA: Diagnosis not present

## 2018-10-19 NOTE — Progress Notes (Signed)
Date:  10/19/2018   Name:  Kristy Sellers   DOB:  Jun 12, 1941   MRN:  [id number]   Chief Complaint: Hypertension (Medicare yearly check up. ) and Hyperlipidemia Currently tired staying with her husband in the hospital for the past week.  She had a mammogram last week - normal.  She has completed all her immunizations.  She has aged out of screening colonoscopy.  Hypertension  This is a chronic problem. The problem is unchanged. The problem is controlled. Pertinent negatives include no headaches, palpitations or shortness of breath. Past treatments include calcium channel blockers. The current treatment provides significant improvement. There are no compliance problems.   Hyperlipidemia  This is a chronic problem. The problem is controlled. Pertinent negatives include no shortness of breath. Current antihyperlipidemic treatment includes statins. The current treatment provides significant improvement of lipids. There are no compliance problems.   Knee Pain   There was no injury mechanism. The pain is present in the right knee. The quality of the pain is described as aching. The pain is mild. The symptoms are aggravated by weight bearing. She has tried rest for the symptoms.    Review of Systems  Constitutional: Negative for chills and fever.  HENT: Negative for congestion, hearing loss, tinnitus, trouble swallowing and voice change.   Eyes: Positive for visual disturbance (chronic issues, blindness in right eye).  Respiratory: Negative for chest tightness and shortness of breath.   Cardiovascular: Negative for palpitations and leg swelling.  Gastrointestinal: Negative for constipation, diarrhea and vomiting.  Endocrine: Negative for polydipsia and polyuria.  Genitourinary: Negative for dysuria, frequency, genital sores, vaginal bleeding and vaginal discharge.  Musculoskeletal: Positive for arthralgias. Negative for gait problem and joint swelling.  Skin: Negative for color change and rash.    Neurological: Negative for dizziness, tremors, light-headedness and headaches.  Hematological: Negative for adenopathy. Does not bruise/bleed easily.  Psychiatric/Behavioral: Negative for dysphoric mood and sleep disturbance. The patient is not nervous/anxious.     Patient Active Problem List   Diagnosis Date Noted  . GERD (gastroesophageal reflux disease) 10/19/2018  . Chronic lumbar radiculopathy 10/15/2017  . Arthritis of knee 10/15/2017  . Primary osteoarthritis of left hip 10/15/2017  . Hyperlipidemia 06/11/2016  . Blind right eye 06/11/2016  . History of Clostridium difficile colitis 06/11/2016  . History of positive PPD 06/11/2016  . Detached retina 05/18/2016  . Pseudoaphakia 05/18/2016  . 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 Ocular hypertension 05/18/2016  . Cataract 05/18/2016  . 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 Essential hypertension 09/27/2014    No Known Allergies  Past Surgical History:  Procedure Laterality Date  . ABDOMINAL HYSTERECTOMY    . COLONOSCOPY  ~2006   normal  . RETINAL DETACHMENT SURGERY Right     Social History   Tobacco Use  . Smoking status: Former Smoker    Types: Cigarettes    Last attempt to quit: 12/30/1983    Years since quitting: 34.8  . Smokeless tobacco: Never Used  Substance Use Topics  . Alcohol use: No    Alcohol/week: 0.0 standard drinks  . Drug use: Not on file     Medication list has been reviewed and updated.  Current Meds  Medication Sig  . amLODipine (NORVASC) 5 MG tablet TAKE 1 TABLET BY MOUTH EVERY DAY  . aspirin 81 MG tablet Take 81 mg by mouth daily.  Marland Kitchen atropine 1 % ophthalmic solution PLACE 1 DROP INTO THE RIGHT EYE ONCE DAILY  .  HOMATROPAIRE 5 % ophthalmic solution Place 1 drop into the right eye 2 (two) times daily. Place in right eye  . Lancets (ONETOUCH ULTRASOFT) lancets   . prednisoLONE acetate (PRED FORTE) 1 % ophthalmic suspension Apply 1 drop to eye 4 (four) times daily.   . simvastatin (ZOCOR) 10 MG tablet TAKE 1 TABLET BY MOUTH EVERYDAY AT BEDTIME    PHQ 2/9 Scores 10/19/2018 10/15/2017 10/14/2016 09/16/2016  PHQ - 2 Score 1 0 0 0  PHQ- 9 Score - - - 4    Physical Exam  Constitutional: She is oriented to person, place, and time. She appears well-developed and well-nourished. No distress.  HENT:  Head: Normocephalic and atraumatic.  Right Ear: Tympanic membrane and ear canal normal.  Left Ear: Tympanic membrane and ear canal normal.  Nose: Right sinus exhibits no maxillary sinus tenderness. Left sinus exhibits no maxillary sinus tenderness.  Mouth/Throat: Uvula is midline and oropharynx is clear and moist.  Neck: Normal range of motion. Carotid bruit is not present. No erythema present. No thyromegaly present.  Cardiovascular: Normal rate, regular rhythm, normal heart sounds and normal pulses.  Pulmonary/Chest: Effort normal. No respiratory distress. She has no wheezes.  Abdominal: Soft. Bowel sounds are normal. There is no hepatosplenomegaly. There is no tenderness. There is no CVA tenderness.  Musculoskeletal: Normal range of motion.  Lymphadenopathy:    She has no cervical adenopathy.    She has no axillary adenopathy.  Neurological: She is alert and oriented to person, place, and time. She has normal reflexes. No cranial nerve deficit or sensory deficit.  Skin: Skin is warm, dry and intact. No rash noted.  Psychiatric: She has a normal mood and affect. Her speech is normal and behavior is normal. Thought content normal.  Nursing note and vitals reviewed.   BP 124/70 (BP Location: Right Arm, Patient Position: Sitting, Cuff Size: Normal)   Pulse 71   Ht 5\' 5"  (1.651 m)   Wt 174 lb (78.9 kg)   SpO2 97%   BMI 28.96 kg/m   Assessment and Plan: 1. Essential hypertension Controlled, continue current medications - CBC with Differential/Platelet - Comprehensive metabolic panel - TSH  2. Mixed hyperlipidemia Stable on statin therapy - Lipid  panel  3. Arthritis of knee Stable, on no regular dose of pain medications   Partially dictated using Editor, commissioning. Any errors are unintentional.  Halina Maidens, MD [facility]  10/19/2018

## 2018-10-20 LAB — CBC WITH DIFFERENTIAL/PLATELET
BASOS: 0 %
Basophils Absolute: 0 10*3/uL (ref 0.0–0.2)
EOS (ABSOLUTE): 0.2 10*3/uL (ref 0.0–0.4)
Eos: 2 %
HEMATOCRIT: 39.8 % (ref 34.0–46.6)
HEMOGLOBIN: 12.9 g/dL (ref 11.1–15.9)
IMMATURE GRANS (ABS): 0 10*3/uL (ref 0.0–0.1)
Immature Granulocytes: 0 %
LYMPHS: 21 %
Lymphocytes Absolute: 2 10*3/uL (ref 0.7–3.1)
MCH: 26 pg — ABNORMAL LOW (ref 26.6–33.0)
MCHC: 32.4 g/dL (ref 31.5–35.7)
MCV: 80 fL (ref 79–97)
MONOCYTES: 7 %
Monocytes Absolute: 0.7 10*3/uL (ref 0.1–0.9)
Neutrophils Absolute: 6.4 10*3/uL (ref 1.4–7.0)
Neutrophils: 70 %
Platelets: 267 10*3/uL (ref 150–450)
RBC: 4.97 x10E6/uL (ref 3.77–5.28)
RDW: 15.3 % (ref 12.3–15.4)
WBC: 9.3 10*3/uL (ref 3.4–10.8)

## 2018-10-20 LAB — COMPREHENSIVE METABOLIC PANEL
A/G RATIO: 1.7 (ref 1.2–2.2)
ALBUMIN: 4.4 g/dL (ref 3.5–4.8)
ALT: 11 IU/L (ref 0–32)
AST: 14 IU/L (ref 0–40)
Alkaline Phosphatase: 77 IU/L (ref 39–117)
BILIRUBIN TOTAL: 0.5 mg/dL (ref 0.0–1.2)
BUN / CREAT RATIO: 17 (ref 12–28)
BUN: 15 mg/dL (ref 8–27)
CHLORIDE: 106 mmol/L (ref 96–106)
CO2: 26 mmol/L (ref 20–29)
Calcium: 9.3 mg/dL (ref 8.7–10.3)
Creatinine, Ser: 0.86 mg/dL (ref 0.57–1.00)
GFR calc Af Amer: 75 mL/min/{1.73_m2} (ref >59)
GFR calc non Af Amer: 65 mL/min/{1.73_m2} (ref >59)
Globulin, Total: 2.6 g/dL (ref 1.5–4.5)
Glucose: 105 mg/dL — ABNORMAL HIGH (ref 65–99)
POTASSIUM: 3.9 mmol/L (ref 3.5–5.2)
Sodium: 146 mmol/L — ABNORMAL HIGH (ref 134–144)
TOTAL PROTEIN: 7 g/dL (ref 6.0–8.5)

## 2018-10-20 LAB — LIPID PANEL
CHOL/HDL RATIO: 2.3 ratio (ref 0.0–4.4)
Cholesterol, Total: 171 mg/dL (ref 100–199)
HDL: 75 mg/dL (ref >39)
LDL Calculated: 84 mg/dL (ref 0–99)
Triglycerides: 62 mg/dL (ref 0–149)
VLDL CHOLESTEROL CAL: 12 mg/dL (ref 5–40)

## 2018-10-20 LAB — TSH: TSH: 2.59 u[IU]/mL (ref 0.450–4.500)

## 2018-11-05 DIAGNOSIS — H44431 Hypotony of eye due to other ocular disorders, right eye: Secondary | ICD-10-CM | POA: Diagnosis not present

## 2018-11-05 DIAGNOSIS — H44112 Panuveitis, left eye: Secondary | ICD-10-CM | POA: Diagnosis not present

## 2018-11-05 DIAGNOSIS — H209 Unspecified iridocyclitis: Secondary | ICD-10-CM | POA: Diagnosis not present

## 2018-11-05 DIAGNOSIS — H3321 Serous retinal detachment, right eye: Secondary | ICD-10-CM | POA: Diagnosis not present

## 2018-11-05 DIAGNOSIS — H18421 Band keratopathy, right eye: Secondary | ICD-10-CM | POA: Diagnosis not present

## 2018-11-05 DIAGNOSIS — H34832 Tributary (branch) retinal vein occlusion, left eye, with macular edema: Secondary | ICD-10-CM | POA: Diagnosis not present

## 2018-11-05 DIAGNOSIS — H40022 Open angle with borderline findings, high risk, left eye: Secondary | ICD-10-CM | POA: Diagnosis not present

## 2018-11-15 ENCOUNTER — Other Ambulatory Visit: Payer: Self-pay | Admitting: Internal Medicine

## 2018-11-15 DIAGNOSIS — M1710 Unilateral primary osteoarthritis, unspecified knee: Secondary | ICD-10-CM

## 2018-12-02 ENCOUNTER — Telehealth: Payer: Self-pay | Admitting: Internal Medicine

## 2018-12-02 NOTE — Telephone Encounter (Addendum)
Left message for patient to call Lattie Haw at [phone].  Need to schedule AWV-S.  Last AWV 10/15/17. [place]

## 2018-12-02 NOTE — Telephone Encounter (Addendum)
Called Mobile phone.  No answer.  Mailbox had not been set up at this time. lec

## 2018-12-24 NOTE — Telephone Encounter (Signed)
Scheduled AWV appointment with [facility] by error. I was not supposed to schedule [employer] patients for AWV at this time.  Found out that with [employer], the AWV needs to be done with provider.  Advised patient to discuss with her provider at next appointment.  Patient voiced understood and was fine with canceling the appointment. lec

## 2019-02-11 DIAGNOSIS — H44112 Panuveitis, left eye: Secondary | ICD-10-CM | POA: Diagnosis not present

## 2019-02-28 ENCOUNTER — Ambulatory Visit (INDEPENDENT_AMBULATORY_CARE_PROVIDER_SITE_OTHER): Payer: Medicare Other

## 2019-02-28 VITALS — BP 136/80 | HR 74 | Temp 98.3°F | Resp 16 | Ht 65.0 in | Wt 170.6 lb

## 2019-02-28 DIAGNOSIS — Z0000 Encounter for general adult medical examination without abnormal findings: Secondary | ICD-10-CM | POA: Diagnosis not present

## 2019-02-28 NOTE — Patient Instructions (Signed)
Kristy Sellers , Thank you for taking time to come for your Medicare Wellness Visit. I appreciate your ongoing commitment to your health goals. Please review the following plan we discussed and let me know if I can assist you in the future.   Screening recommendations/referrals: Colonoscopy: Cologuard completed 11/24/16.  Mammogram: done 10/05/18 Bone Density: done 05/30/15 Recommended yearly ophthalmology/optometry visit for glaucoma screening and checkup Recommended yearly dental visit for hygiene and checkup  Vaccinations: Influenza vaccine: done 09/17/18 Pneumococcal vaccine: done 10/15/17 Tdap vaccine: done 07/10/14 Shingles vaccine: Shingrix discussed. Please contact your pharmacy for coverage information.   Advanced directives: Please bring a copy of your health care power of attorney and living will to the office at your convenience once you have completed those documents.   Conditions/risks identified: Keep up the great work!  Next appointment: Please follow up in one year for your Medicare Annual Wellness visit.    Preventive Care 36 Years and Older, Female Preventive care refers to lifestyle choices and visits with your health care provider that can promote health and wellness. What does preventive care include?  A yearly physical exam. This is also called an annual well check.  Dental exams once or twice a year.  Routine eye exams. Ask your health care provider how often you should have your eyes checked.  Personal lifestyle choices, including:  Daily care of your teeth and gums.  Regular physical activity.  Eating a healthy diet.  Avoiding tobacco and drug use.  Limiting alcohol use.  Practicing safe sex.  Taking low-dose aspirin every day.  Taking vitamin and mineral supplements as recommended by your health care provider. What happens during an annual well check? The services and screenings done by your health care provider during your annual well check will  depend on your age, overall health, lifestyle risk factors, and family history of disease. Counseling  Your health care provider may ask you questions about your:  Alcohol use.  Tobacco use.  Drug use.  Emotional well-being.  Home and relationship well-being.  Sexual activity.  Eating habits.  History of falls.  Memory and ability to understand (cognition).  Work and work Statistician.  Reproductive health. Screening  You may have the following tests or measurements:  Height, weight, and BMI.  Blood pressure.  Lipid and cholesterol levels. These may be checked every 5 years, or more frequently if you are over 50 years old.  Skin check.  Lung cancer screening. You may have this screening every year starting at age 63 if you have a 30-pack-year history of smoking and currently smoke or have quit within the past 15 years.  Fecal occult blood test (FOBT) of the stool. You may have this test every year starting at age 3.  Flexible sigmoidoscopy or colonoscopy. You may have a sigmoidoscopy every 5 years or a colonoscopy every 10 years starting at age 51.  Hepatitis C blood test.  Hepatitis B blood test.  Sexually transmitted disease (STD) testing.  Diabetes screening. This is done by checking your blood sugar (glucose) after you have not eaten for a while (fasting). You may have this done every 1-3 years.  Bone density scan. This is done to screen for osteoporosis. You may have this done starting at age 71.  Mammogram. This may be done every 1-2 years. Talk to your health care provider about how often you should have regular mammograms. Talk with your health care provider about your test results, treatment options, and if necessary, the need for  more tests. Vaccines  Your health care provider may recommend certain vaccines, such as:  Influenza vaccine. This is recommended every year.  Tetanus, diphtheria, and acellular pertussis (Tdap, Td) vaccine. You may need a  Td booster every 10 years.  Zoster vaccine. You may need this after age 40.  Pneumococcal 13-valent conjugate (PCV13) vaccine. One dose is recommended after age 92.  Pneumococcal polysaccharide (PPSV23) vaccine. One dose is recommended after age 51. Talk to your health care provider about which screenings and vaccines you need and how often you need them. This information is not intended to replace advice given to you by your health care provider. Make sure you discuss any questions you have with your health care provider. Document Released: 01/11/2016 Document Revised: 09/03/2016 Document Reviewed: 10/16/2015 Elsevier Interactive Patient Education  2017 [place] Prevention in the Home Falls can cause injuries. They can happen to people of all ages. There are many things you can do to make your home safe and to help prevent falls. What can I do on the outside of my home?  Regularly fix the edges of walkways and driveways and fix any cracks.  Remove anything that might make you trip as you walk through a door, such as a raised step or threshold.  Trim any bushes or trees on the path to your home.  Use bright outdoor lighting.  Clear any walking paths of anything that might make someone trip, such as rocks or tools.  Regularly check to see if handrails are loose or broken. Make sure that both sides of any steps have handrails.  Any raised decks and porches should have guardrails on the edges.  Have any leaves, snow, or ice cleared regularly.  Use sand or salt on walking paths during winter.  Clean up any spills in your garage right away. This includes oil or grease spills. What can I do in the bathroom?  Use night lights.  Install grab bars by the toilet and in the tub and shower. Do not use towel bars as grab bars.  Use non-skid mats or decals in the tub or shower.  If you need to sit down in the shower, use a plastic, non-slip stool.  Keep the floor dry. Clean  up any water that spills on the floor as soon as it happens.  Remove soap buildup in the tub or shower regularly.  Attach bath mats securely with double-sided non-slip rug tape.  Do not have throw rugs and other things on the floor that can make you trip. What can I do in the bedroom?  Use night lights.  Make sure that you have a light by your bed that is easy to reach.  Do not use any sheets or blankets that are too big for your bed. They should not hang down onto the floor.  Have a firm chair that has side arms. You can use this for support while you get dressed.  Do not have throw rugs and other things on the floor that can make you trip. What can I do in the kitchen?  Clean up any spills right away.  Avoid walking on wet floors.  Keep items that you use a lot in easy-to-reach places.  If you need to reach something above you, use a strong step stool that has a grab bar.  Keep electrical cords out of the way.  Do not use floor polish or wax that makes floors slippery. If you must use wax, use non-skid  floor wax.  Do not have throw rugs and other things on the floor that can make you trip. What can I do with my stairs?  Do not leave any items on the stairs.  Make sure that there are handrails on both sides of the stairs and use them. Fix handrails that are broken or loose. Make sure that handrails are as long as the stairways.  Check any carpeting to make sure that it is firmly attached to the stairs. Fix any carpet that is loose or worn.  Avoid having throw rugs at the top or bottom of the stairs. If you do have throw rugs, attach them to the floor with carpet tape.  Make sure that you have a light switch at the top of the stairs and the bottom of the stairs. If you do not have them, ask someone to add them for you. What else can I do to help prevent falls?  Wear shoes that:  Do not have high heels.  Have rubber bottoms.  Are comfortable and fit you well.  Are  closed at the toe. Do not wear sandals.  If you use a stepladder:  Make sure that it is fully opened. Do not climb a closed stepladder.  Make sure that both sides of the stepladder are locked into place.  Ask someone to hold it for you, if possible.  Clearly mark and make sure that you can see:  Any grab bars or handrails.  First and last steps.  Where the edge of each step is.  Use tools that help you move around (mobility aids) if they are needed. These include:  Canes.  Walkers.  Scooters.  Crutches.  Turn on the lights when you go into a dark area. Replace any light bulbs as soon as they burn out.  Set up your furniture so you have a clear path. Avoid moving your furniture around.  If any of your floors are uneven, fix them.  If there are any pets around you, be aware of where they are.  Review your medicines with your doctor. Some medicines can make you feel dizzy. This can increase your chance of falling. Ask your doctor what other things that you can do to help prevent falls. This information is not intended to replace advice given to you by your health care provider. Make sure you discuss any questions you have with your health care provider. Document Released: 10/11/2009 Document Revised: 05/22/2016 Document Reviewed: 01/19/2015 Elsevier Interactive Patient Education  2017 [employer].

## 2019-02-28 NOTE — Progress Notes (Addendum)
Subjective:   Kristy Sellers is a 78 y.o. female who presents for Medicare Annual (Subsequent) preventive examination.  Review of Systems:   Cardiac Risk Factors include: advanced age (>53men, >75 women);hypertension;dyslipidemia     Objective:     Vitals: BP 136/80 (BP Location: Left Arm, Patient Position: Sitting, Cuff Size: Normal)   Pulse 74   Temp 98.3 F (36.8 C) (Oral)   Resp 16   Ht 5\' 5"  (1.651 m)   Wt 170 lb 9.6 oz (77.4 kg)   SpO2 98%   BMI 28.39 kg/m   Body mass index is 28.39 kg/m.  Advanced Directives 02/28/2019 10/14/2016 09/06/2015 09/06/2015  Does Patient Have a Medical Advance Directive? Yes No No No  Type of Paramedic of [place];Living will - - -  Copy of [place] in Chart? No - copy requested - - -  Would patient like information on creating a medical advance directive? - - Yes Higher education careers adviser given;Yes - Spiritual care consult ordered Yes - Educational materials given;Yes - Spiritual care consult ordered    Tobacco Social History   Tobacco Use  Smoking Status Former Smoker  . Types: Cigarettes  . Last attempt to quit: 12/30/1983  . Years since quitting: 35.1  Smokeless Tobacco Never Used     Counseling given: Not Answered   Clinical Intake:  Pre-visit preparation completed: Yes  Pain : No/denies pain     BMI - recorded: 28.39 Nutritional Status: BMI 25 -29 Overweight Nutritional Risks: None Diabetes: No  How often do you need to have someone help you when you read instructions, pamphlets, or other written materials from your doctor or pharmacy?: 1 - Never  Interpreter Needed?: No  Information entered by :: Clemetine Marker LPN  Past Medical History:  Diagnosis Date  . GERD (gastroesophageal reflux disease)   . History of detached retina repair   . Hyperlipidemia   . Hypertension    Past Surgical History:  Procedure Laterality Date  . ABDOMINAL HYSTERECTOMY    . COLONOSCOPY  ~2006   normal  . RETINAL DETACHMENT SURGERY Right    Family History  Problem Relation Age of Onset  . Parkinson's disease Mother   . CAD Father   . CAD Son        57  . Breast cancer Neg Hx    Social History   Socioeconomic History  . Marital status: Married    Spouse name: Not on file  . Number of children: 4  . Years of education: Not on file  . Highest education level: Some college, no degree  Occupational History  . Not on file  Social Needs  . Financial resource strain: Not hard at all  . Food insecurity:    Worry: Never true    Inability: Never true  . Transportation needs:    Medical: No    Non-medical: No  Tobacco Use  . Smoking status: Former Smoker    Types: Cigarettes    Last attempt to quit: 12/30/1983    Years since quitting: 35.1  . Smokeless tobacco: Never Used  Substance and Sexual Activity  . Alcohol use: No    Alcohol/week: 0.0 standard drinks  . Drug use: Never  . Sexual activity: Not Currently  Lifestyle  . Physical activity:    Days per week: 7 days    Minutes per session: 40 min  . Stress: To some extent  Relationships  . Social connections:    Talks on  phone: Patient refused    Gets together: Patient refused    Attends religious service: Patient refused    Active member of club or organization: Patient refused    Attends meetings of clubs or organizations: Patient refused    Relationship status: Patient refused  Other Topics Concern  . 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 amLODipine (NORVASC) 5 MG tablet TAKE 1 TABLET BY MOUTH EVERY DAY  . aspirin 81 MG tablet Take 81 mg by mouth daily.  Marland Kitchen atropine 1 % ophthalmic solution PLACE 1 DROP INTO THE RIGHT EYE ONCE DAILY  . HOMATROPAIRE 5 % ophthalmic solution Place 1 drop into the right eye 2 (two) times daily. Place in right eye  .  simvastatin (ZOCOR) 10 MG tablet TAKE 1 TABLET BY MOUTH EVERYDAY AT BEDTIME  . [DISCONTINUED] Lancets (ONETOUCH ULTRASOFT) lancets   . [DISCONTINUED] NAPROXEN 375 MG TBEC EC tablet TAKE 1 TABLET (375 MG TOTAL) BY MOUTH DAILY AS NEEDED.  . [DISCONTINUED] prednisoLONE acetate (PRED FORTE) 1 % ophthalmic suspension Apply 1 drop to eye 4 (four) times daily.   No facility-administered encounter medications on file as of 02/28/2019.     Activities of Daily Living In your present state of health, do you have any difficulty performing the following activities: 02/28/2019 02/28/2019  Hearing? N N  Comment declines hearing aids declines hearing aids  Vision? Y -  Comment legally blind in right eye -  Difficulty concentrating or making decisions? N -  Walking or climbing stairs? N -  Dressing or bathing? N -  Doing errands, shopping? N -  Preparing Food and eating ? N -  Using the Toilet? N -  In the past six months, have you accidently leaked urine? N -  Do you have problems with loss of bowel control? N -  Managing your Medications? N -  Managing your Finances? N -  Housekeeping or managing your Housekeeping? N -  Some recent data might be hidden    Patient Care Team: Glean Hess, MD as PCP - General (Family Medicine)    Assessment:   This is a routine wellness examination for [name].  Exercise Activities and Dietary recommendations Current Exercise Habits: Home exercise routine, Type of exercise: stretching;calisthenics, Time (Minutes): 45, Frequency (Times/Week): 7, Weekly Exercise (Minutes/Week): 315, Intensity: Mild, Exercise limited by: None identified  Goals    . Patient Stated     Patient states she would like to be more socially involved with volunteering and within her community. Also have more personal time.        Fall Risk Fall Risk  02/28/2019 10/19/2018 10/15/2017 10/14/2016 09/16/2016  Falls in the past year? 0 No No No No  Number falls in past yr: 0 - - - -  Injury  with Fall? 0 - - - -  Follow up Falls prevention discussed - - - -   FALL RISK PREVENTION PERTAINING TO THE HOME:  Any stairs in or around the home? Yes  If so, do they handrails? Yes   Home free of loose throw rugs in walkways, pet beds, electrical cords, etc? Yes  Adequate lighting in your home to reduce risk of falls? Yes   ASSISTIVE DEVICES UTILIZED TO PREVENT FALLS:  Life alert? No  Use of a cane, walker or w/c? No  Grab bars  in the bathroom? No  Shower chair or bench in shower? No  Elevated toilet seat or a handicapped toilet? No   DME ORDERS:  DME order needed?  No   TIMED UP AND GO:  Was the test performed? Yes .  Length of time to ambulate 10 feet: 5 sec.   GAIT:  Appearance of gait: Gait stead-fast and without the use of an assistive device.   Education: Fall risk prevention has been discussed.  Intervention(s) required? No   Depression Screen PHQ 2/9 Scores 02/28/2019 10/19/2018 10/15/2017 10/14/2016  PHQ - 2 Score 2 1 0 0  PHQ- 9 Score 3 - - -     Cognitive Function     6CIT Screen 02/28/2019 10/15/2017 10/14/2016  What Year? 0 points 0 points 0 points  What month? 0 points 0 points 0 points  What time? 0 points 0 points 0 points  Count back from 20 0 points 0 points 0 points  Months in reverse 0 points 0 points 0 points  Repeat phrase 0 points 4 points 0 points  Total Score 0 4 0    Immunization History  Administered Date(s) Administered  . Influenza, High Dose Seasonal PF 09/17/2018  . Influenza,inj,Quad PF,6+ Mos 10/14/2016, 10/15/2017  . Pneumococcal Conjugate-13 10/15/2017  . Pneumococcal-Unspecified 12/29/2013  . Tdap 07/10/2014  . Zoster 09/28/2013    Qualifies for Shingles Vaccine? Yes  Zostavax completed 2014. Due for Shingrix. Education has been provided regarding the importance of this vaccine. Pt has been advised to call insurance company to determine out of pocket expense. Advised may also receive vaccine at local pharmacy or Health  Dept. Verbalized acceptance and understanding.  Tdap: Up to date  Flu Vaccine: Up to date  Pneumococcal Vaccine: Up to date    Screening Tests Health Maintenance  Topic Date Due  . TETANUS/TDAP  07/10/2024  . INFLUENZA VACCINE  Completed  . DEXA SCAN  Completed  . PNA vac Low Risk Adult  Completed  . URINE MICROALBUMIN  Discontinued    Cancer Screenings:  Colorectal Screening: Cologuard Completed 11/24/16. Repeat every 3 years; No longer required.   Mammogram: Completed 10/05/18. Repeat every year.  Bone Density: Completed 05/30/15. Results reflect NORMAL,  Repeat every 2 years. Pt declines repeat screening at this time.   Lung Cancer Screening: (Low Dose CT Chest recommended if Age 79-80 years, 30 pack-year currently smoking OR have quit w/in 15years.) does not qualify.    Additional Screening:  Hepatitis C Screening: no longer required Vision Screening: Recommended annual ophthalmology exams for early detection of glaucoma and other disorders of the eye. Is the patient up to date with their annual eye exam?  Yes  Who is the provider or what is the name of the office in which the pt attends annual eye exams? [facility]  Dental Screening: Recommended annual dental exams for proper oral hygiene  Community Resource Referral:  CRR required this visit?  No      Plan:     I have personally reviewed and addressed the Medicare Annual Wellness questionnaire and have noted the following in the patient's chart:  A. Medical and social history B. Use of alcohol, tobacco or illicit drugs  C. Current medications and supplements D. Functional ability and status E.  Nutritional status F.  Physical activity G. Advance directives H. List of other physicians I.  Hospitalizations, surgeries, and ER visits in previous 12 months J.  [place] such as hearing and vision if needed,  cognitive and depression L. Referrals and appointments   In addition, I have reviewed  and discussed with patient certain preventive protocols, quality metrics, and best practice recommendations. A written personalized care plan for preventive services as well as general preventive health recommendations were provided to patient.   Signed,  Clemetine Marker, LPN Nurse Health Advisor   Nurse Notes: patient would like to have EKG at next visit in April if Medicare will pay for it as a preventative service. She c/o caregiver stress today with her husband who was at the [place] for 3 months and then rehab for several weeks and now finally home. She states she is feeling a little better now.

## 2019-03-28 ENCOUNTER — Ambulatory Visit: Payer: Medicare Other

## 2019-04-04 ENCOUNTER — Encounter: Payer: Medicare Other | Admitting: Internal Medicine

## 2019-04-18 ENCOUNTER — Telehealth: Payer: Self-pay

## 2019-04-18 ENCOUNTER — Other Ambulatory Visit: Payer: Self-pay | Admitting: Internal Medicine

## 2019-04-18 DIAGNOSIS — K219 Gastro-esophageal reflux disease without esophagitis: Secondary | ICD-10-CM

## 2019-04-18 MED ORDER — FAMOTIDINE 20 MG PO TABS
20.0000 mg | ORAL_TABLET | Freq: Two times a day (BID) | ORAL | 1 refills | Status: DC
Start: 2019-04-18 — End: 2019-08-24

## 2019-04-18 NOTE — Telephone Encounter (Signed)
Patient needs [place] to call in new RX for Kristy Sellers since Ranitidine is off shelf. I do not see it on med list but she says your name is on the RX she has and it expires in April but is not being sold now due to recall. [phone]

## 2019-04-18 NOTE — Telephone Encounter (Signed)
Sent in new Rx for Pepcid 20 mg twice a day.

## 2019-04-20 ENCOUNTER — Ambulatory Visit: Payer: Medicare Other | Admitting: Internal Medicine

## 2019-05-30 ENCOUNTER — Other Ambulatory Visit: Payer: Self-pay | Admitting: Internal Medicine

## 2019-05-30 DIAGNOSIS — E785 Hyperlipidemia, unspecified: Secondary | ICD-10-CM

## 2019-05-30 DIAGNOSIS — I10 Essential (primary) hypertension: Secondary | ICD-10-CM

## 2019-06-16 ENCOUNTER — Encounter: Payer: Self-pay | Admitting: Internal Medicine

## 2019-06-16 ENCOUNTER — Ambulatory Visit (INDEPENDENT_AMBULATORY_CARE_PROVIDER_SITE_OTHER): Payer: Medicare Other | Admitting: Internal Medicine

## 2019-06-16 ENCOUNTER — Other Ambulatory Visit: Payer: Self-pay

## 2019-06-16 VITALS — BP 126/70 | HR 72 | Ht 65.0 in | Wt 168.0 lb

## 2019-06-16 DIAGNOSIS — R2681 Unsteadiness on feet: Secondary | ICD-10-CM

## 2019-06-16 DIAGNOSIS — I10 Essential (primary) hypertension: Secondary | ICD-10-CM | POA: Diagnosis not present

## 2019-06-16 DIAGNOSIS — K219 Gastro-esophageal reflux disease without esophagitis: Secondary | ICD-10-CM

## 2019-06-16 NOTE — Progress Notes (Signed)
Date:  06/16/2019   Name:  Kristy Sellers   DOB:  [birth year]   MRN:  [id number]   Chief Complaint: Hypertension and Fall (Last fall was 2 wks ago. Mother has parkinsons at 30. Has had 4 falls in the last in the last month.  )  Hypertension This is a chronic problem. The problem is controlled. Pertinent negatives include no chest pain, palpitations or shortness of breath. Past treatments include calcium channel blockers. The current treatment provides significant improvement.  Gastroesophageal Reflux She complains of heartburn. She reports no chest pain or no coughing. This is a recurrent problem. The problem occurs rarely. The heartburn does not wake her from sleep. The heartburn does not limit her activity. Pertinent negatives include no fatigue. She has tried a histamine-2 antagonist (changed zantac to pepcid) for the symptoms. The treatment provided significant relief.  Frequent falls - has fallen 4 times this month, feels very off balance but often does not know why she falls.  The falls usually occur while walking outside. She has not sustained any injury.  Lab Results  Component Value Date   CREATININE 0.86 10/19/2018   BUN 15 10/19/2018   NA 146 (H) 10/19/2018   K 3.9 10/19/2018   CL 106 10/19/2018   CO2 26 10/19/2018   Lab Results  Component Value Date   CHOL 171 10/19/2018   HDL 75 10/19/2018   LDLCALC 84 10/19/2018   TRIG 62 10/19/2018   CHOLHDL 2.3 10/19/2018     Review of Systems  Constitutional: Negative for appetite change, fatigue and unexpected weight change.  HENT: Negative for trouble swallowing.   Eyes: Negative for visual disturbance.  Respiratory: Negative for cough, chest tightness and shortness of breath.   Cardiovascular: Negative for chest pain, palpitations and leg swelling.  Gastrointestinal: Positive for heartburn.  Genitourinary: Negative for dysuria.  Musculoskeletal: Negative for arthralgias.  Neurological: Negative for tremors.   Psychiatric/Behavioral: Negative for dysphoric mood.    Patient Active Problem List   Diagnosis Date Noted  . GERD (gastroesophageal reflux disease) 10/19/2018  . 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  . Chronic lumbar radiculopathy 10/15/2017  . Arthritis of knee 10/15/2017  . Primary osteoarthritis of left hip 10/15/2017  . Hyperlipidemia 06/11/2016  . Blind right eye 06/11/2016  . History of Clostridium difficile colitis 06/11/2016  . History of positive PPD 06/11/2016  . Detached retina 05/18/2016  . Pseudoaphakia 05/18/2016  . 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 Ocular hypertension 05/18/2016  . Cataract 05/18/2016  . 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 Essential hypertension 09/27/2014    No Known Allergies  Past Surgical History:  Procedure Laterality Date  . ABDOMINAL HYSTERECTOMY    . COLONOSCOPY  ~2006   normal  . RETINAL DETACHMENT SURGERY Right     Social History   Tobacco Use  . Smoking status: Former Smoker    Types: Cigarettes    Quit date: 12/30/1983    Years since quitting: 35.4  . Smokeless tobacco: Never Used  Substance Use Topics  . Alcohol use: No    Alcohol/week: 0.0 standard drinks  . Drug use: Never     Medication list has been reviewed and updated.  Current Meds  Medication Sig  . amLODipine (NORVASC) 5 MG tablet TAKE 1 TABLET BY MOUTH EVERY DAY  . aspirin 81 MG tablet Take 81 mg by mouth daily.  Marland Kitchen atropine 1 % ophthalmic solution PLACE 1 DROP INTO THE RIGHT EYE ONCE DAILY  .  famotidine (PEPCID) 20 MG tablet Take 1 tablet (20 mg total) by mouth 2 (two) times daily.  Marland Kitchen HOMATROPAIRE 5 % ophthalmic solution Place 1 drop into the right eye once. Place in right eye  . prednisoLONE acetate (PRED FORTE) 1 % ophthalmic suspension   . simvastatin (ZOCOR) 10 MG tablet TAKE 1 TABLET BY MOUTH EVERYDAY AT BEDTIME    PHQ 2/9 Scores 06/16/2019 02/28/2019 10/19/2018 10/15/2017  PHQ - 2 Score 0 2 1 0  PHQ- 9 Score - 3 - -     BP Readings from Last 3 Encounters:  06/16/19 126/70  02/28/19 136/80  10/19/18 124/70    Physical Exam Vitals signs and nursing note reviewed.  Constitutional:      General: She is not in acute distress.    Appearance: She is well-developed.  HENT:     Head: Normocephalic and atraumatic.  Neck:     Musculoskeletal: Normal range of motion.  Cardiovascular:     Rate and Rhythm: Normal rate and regular rhythm.     Pulses: Normal pulses.     Heart sounds: No murmur.  Pulmonary:     Effort: Pulmonary effort is normal. No respiratory distress.     Breath sounds: No wheezing or rhonchi.  Musculoskeletal:     Right knee: She exhibits decreased range of motion. She exhibits no swelling and no effusion.     Right lower leg: No edema.     Left lower leg: No edema.  Lymphadenopathy:     Cervical: No cervical adenopathy.  Skin:    General: Skin is warm and dry.     Findings: No rash.  Neurological:     Mental Status: She is alert and oriented to person, place, and time.     Cranial Nerves: Cranial nerves are intact.     Sensory: Sensation is intact.     Motor: Motor function is intact. No tremor.     Gait: Gait is intact.  Psychiatric:        Attention and Perception: Attention normal.        Mood and Affect: Mood normal.        Behavior: Behavior normal.        Thought Content: Thought content normal.     Wt Readings from Last 3 Encounters:  06/16/19 168 lb (76.2 kg)  02/28/19 170 lb 9.6 oz (77.4 kg)  10/19/18 174 lb (78.9 kg)    BP 126/70   Pulse 72   Ht 5\' 5"  (1.651 m)   Wt 168 lb (76.2 kg)   SpO2 96%   BMI 27.96 kg/m   Assessment and Plan: 1. Essential hypertension controlled  2. Gastroesophageal reflux disease, esophagitis presence not specified Doing well in Pepcid PRN  3. Gait instability Will try PT, if sx worsen would recommend Neuro eval. - Ambulatory referral to Physical Therapy   Partially dictated using Dragon software. Any errors are  unintentional.  Halina Maidens, MD [facility]  06/16/2019

## 2019-06-24 DIAGNOSIS — H40022 Open angle with borderline findings, high risk, left eye: Secondary | ICD-10-CM | POA: Diagnosis not present

## 2019-06-24 DIAGNOSIS — H3321 Serous retinal detachment, right eye: Secondary | ICD-10-CM | POA: Diagnosis not present

## 2019-06-24 DIAGNOSIS — H44112 Panuveitis, left eye: Secondary | ICD-10-CM | POA: Diagnosis not present

## 2019-06-24 DIAGNOSIS — H209 Unspecified iridocyclitis: Secondary | ICD-10-CM | POA: Diagnosis not present

## 2019-06-24 DIAGNOSIS — H18421 Band keratopathy, right eye: Secondary | ICD-10-CM | POA: Diagnosis not present

## 2019-06-24 DIAGNOSIS — H34832 Tributary (branch) retinal vein occlusion, left eye, with macular edema: Secondary | ICD-10-CM | POA: Diagnosis not present

## 2019-06-24 DIAGNOSIS — H44431 Hypotony of eye due to other ocular disorders, right eye: Secondary | ICD-10-CM | POA: Diagnosis not present

## 2019-08-24 ENCOUNTER — Encounter: Payer: Self-pay | Admitting: Internal Medicine

## 2019-08-24 ENCOUNTER — Ambulatory Visit (INDEPENDENT_AMBULATORY_CARE_PROVIDER_SITE_OTHER): Payer: Medicare Other | Admitting: Internal Medicine

## 2019-08-24 ENCOUNTER — Other Ambulatory Visit: Payer: Self-pay

## 2019-08-24 VITALS — BP 118/72 | HR 79 | Temp 97.3°F | Ht 65.0 in | Wt 164.0 lb

## 2019-08-24 DIAGNOSIS — F418 Other specified anxiety disorders: Secondary | ICD-10-CM | POA: Insufficient documentation

## 2019-08-24 MED ORDER — LORAZEPAM 0.5 MG PO TABS: 0.5000 mg | ORAL_TABLET | Freq: Two times a day (BID) | ORAL | 0 refills | Status: DC | PRN

## 2019-08-24 MED ORDER — SERTRALINE HCL 25 MG PO TABS
25.0000 mg | ORAL_TABLET | Freq: Every day | ORAL | 1 refills | Status: DC
Start: 2019-08-24 — End: 2019-09-15

## 2019-08-24 NOTE — Progress Notes (Signed)
Date:  08/24/2019   Name:  Kristy Sellers   DOB:  1941/12/19   MRN:  [id number]   Chief Complaint: Anxiety (Yesterday woke up with bad headache and nausea. Last 2 weeks have been very difficult. Husband now has stage 4 liver cancer and hospice is in and out of her home every day and she feels like she doesn't even have a home of her own. Feels extremely stressed.  PHQ9- 4, GAD7- 10 )  Anxiety Presents for initial visit. The problem has been gradually worsening. Symptoms include excessive worry, irritability and nervous/anxious behavior. Patient reports no chest pain, dizziness, palpitations, shortness of breath or suicidal ideas. Symptoms occur most days. The severity of symptoms is moderate. The symptoms are aggravated by family issues. The quality of sleep is fair.   Risk factors: husband with terminal lung cancer.    Review of Systems  Constitutional: Positive for irritability. Negative for chills, fatigue and fever.  HENT: Negative for trouble swallowing.   Respiratory: Negative for chest tightness and shortness of breath.   Cardiovascular: Negative for chest pain, palpitations and leg swelling.  Gastrointestinal: Negative for abdominal pain.  Neurological: Positive for headaches. Negative for dizziness.  Psychiatric/Behavioral: Positive for dysphoric mood. Negative for suicidal ideas. The patient is nervous/anxious.     Patient Active Problem List   Diagnosis Date Noted  . Mixed anxiety and depressive disorder 08/24/2019  . GERD (gastroesophageal reflux disease) 10/19/2018  . 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  . Chronic lumbar radiculopathy 10/15/2017  . Arthritis of knee 10/15/2017  . Primary osteoarthritis of left hip 10/15/2017  . Hyperlipidemia 06/11/2016  . Blind right eye 06/11/2016  . History of Clostridium difficile colitis 06/11/2016  . History of positive PPD 06/11/2016  . Detached retina 05/18/2016  . Pseudoaphakia 05/18/2016  . 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  Ocular hypertension 05/18/2016  . Cataract 05/18/2016  . 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 Essential hypertension 09/27/2014    No Known Allergies  Past Surgical History:  Procedure Laterality Date  . ABDOMINAL HYSTERECTOMY    . COLONOSCOPY  ~2006   normal  . RETINAL DETACHMENT SURGERY Right     Social History   Tobacco Use  . Smoking status: Former Smoker    Types: Cigarettes    Quit date: 12/30/1983    Years since quitting: 35.6  . Smokeless tobacco: Never Used  Substance Use Topics  . Alcohol use: No    Alcohol/week: 0.0 standard drinks  . Drug use: Never     Medication list has been reviewed and updated.  Current Meds  Medication Sig  . amLODipine (NORVASC) 5 MG tablet TAKE 1 TABLET BY MOUTH EVERY DAY  . aspirin 81 MG tablet Take 81 mg by mouth daily.  Marland Kitchen atropine 1 % ophthalmic solution PLACE 1 DROP INTO THE RIGHT EYE ONCE DAILY  . HOMATROPAIRE 5 % ophthalmic solution Place 1 drop into the right eye once. Place in right eye  . prednisoLONE acetate (PRED FORTE) 1 % ophthalmic suspension   . simvastatin (ZOCOR) 10 MG tablet TAKE 1 TABLET BY MOUTH EVERYDAY AT BEDTIME    PHQ 2/9 Scores 08/24/2019 06/16/2019 02/28/2019 10/19/2018  PHQ - 2 Score 2 0 2 1  PHQ- 9 Score 4 - 3 -   GAD 7 : Generalized Anxiety Score 08/24/2019 09/16/2016  Nervous, Anxious, on Edge 3 2  Control/stop worrying 0 3  Worry too much - different things 3 3  Trouble relaxing  3 3  Restless 0 0  Easily annoyed or irritable 1 0  Afraid - awful might happen 0 0  Total GAD 7 Score 10 11  Anxiety Difficulty Extremely difficult Somewhat difficult      BP Readings from Last 3 Encounters:  08/24/19 118/72  06/16/19 126/70  02/28/19 136/80    Physical Exam Vitals signs and nursing note reviewed.  Constitutional:      General: She is not in acute distress.    Appearance: She is well-developed.  HENT:     Head: Normocephalic and atraumatic.  Cardiovascular:      Rate and Rhythm: Normal rate and regular rhythm.     Pulses: Normal pulses.  Pulmonary:     Effort: Pulmonary effort is normal. No respiratory distress.     Breath sounds: No wheezing or rhonchi.  Musculoskeletal: Normal range of motion.     Right lower leg: No edema.     Left lower leg: No edema.  Skin:    General: Skin is warm and dry.     Capillary Refill: Capillary refill takes less than 2 seconds.     Findings: No rash.  Neurological:     General: No focal deficit present.     Mental Status: She is alert and oriented to person, place, and time.  Psychiatric:        Attention and Perception: Attention normal.        Mood and Affect: Mood is anxious.        Behavior: Behavior normal.        Thought Content: Thought content normal.     Wt Readings from Last 3 Encounters:  08/24/19 164 lb (74.4 kg)  06/16/19 168 lb (76.2 kg)  02/28/19 170 lb 9.6 oz (77.4 kg)    BP 118/72   Pulse 79   Temp (!) 97.3 F (36.3 C) (Temporal)   Ht 5\' 5"  (1.651 m)   Wt 164 lb (74.4 kg)   SpO2 96%   BMI 27.29 kg/m   Assessment and Plan: 1. Mixed anxiety and depressive disorder Pt is clinically stable but suffering significant distress related to anxiety, worsened by husbands terminal illness Will begin low dose sertraline, titrate up at next appt in 2 mo if needed Lorazepam prn severe episode of anxiety - pt warned not to drive after taking this medication for 12 hours - sertraline (ZOLOFT) 25 MG tablet; Take 1 tablet (25 mg total) by mouth daily.  Dispense: 30 tablet; Refill: 1 - LORazepam (ATIVAN) 0.5 MG tablet; Take 1 tablet (0.5 mg total) by mouth 2 (two) times daily as needed for anxiety.  Dispense: 20 tablet; Refill: 0   Partially dictated using Editor, commissioning. Any errors are unintentional.  Halina Maidens, MD [facility]  08/24/2019

## 2019-09-02 ENCOUNTER — Other Ambulatory Visit: Payer: Self-pay | Admitting: Internal Medicine

## 2019-09-02 DIAGNOSIS — Z1231 Encounter for screening mammogram for malignant neoplasm of breast: Secondary | ICD-10-CM

## 2019-09-15 ENCOUNTER — Other Ambulatory Visit: Payer: Self-pay | Admitting: Internal Medicine

## 2019-09-15 DIAGNOSIS — F418 Other specified anxiety disorders: Secondary | ICD-10-CM

## 2019-09-20 ENCOUNTER — Ambulatory Visit: Payer: Medicare Other

## 2019-09-21 ENCOUNTER — Ambulatory Visit (INDEPENDENT_AMBULATORY_CARE_PROVIDER_SITE_OTHER): Payer: Medicare Other

## 2019-09-21 ENCOUNTER — Other Ambulatory Visit: Payer: Self-pay

## 2019-09-21 DIAGNOSIS — Z23 Encounter for immunization: Secondary | ICD-10-CM

## 2019-10-10 ENCOUNTER — Ambulatory Visit
Admission: RE | Admit: 2019-10-10 | Discharge: 2019-10-10 | Disposition: A | Discharge status: Home / Self Care | Payer: Medicare Other | Source: Ambulatory Visit | Attending: Internal Medicine | Admitting: Internal Medicine

## 2019-10-10 ENCOUNTER — Other Ambulatory Visit: Payer: Self-pay

## 2019-10-10 DIAGNOSIS — Z1231 Encounter for screening mammogram for malignant neoplasm of breast: Secondary | ICD-10-CM | POA: Diagnosis not present

## 2019-10-25 ENCOUNTER — Encounter: Payer: Self-pay | Admitting: Internal Medicine

## 2019-10-25 ENCOUNTER — Ambulatory Visit (INDEPENDENT_AMBULATORY_CARE_PROVIDER_SITE_OTHER): Payer: Medicare Other | Admitting: Internal Medicine

## 2019-10-25 ENCOUNTER — Other Ambulatory Visit: Payer: Self-pay

## 2019-10-25 VITALS — BP 132/74 | HR 62 | Ht 65.0 in | Wt 164.0 lb

## 2019-10-25 DIAGNOSIS — I10 Essential (primary) hypertension: Secondary | ICD-10-CM | POA: Diagnosis not present

## 2019-10-25 DIAGNOSIS — K219 Gastro-esophageal reflux disease without esophagitis: Secondary | ICD-10-CM

## 2019-10-25 DIAGNOSIS — E782 Mixed hyperlipidemia: Secondary | ICD-10-CM | POA: Diagnosis not present

## 2019-10-25 DIAGNOSIS — F418 Other specified anxiety disorders: Secondary | ICD-10-CM | POA: Diagnosis not present

## 2019-10-25 LAB — POCT URINALYSIS DIPSTICK
Bilirubin, UA: NEGATIVE
Blood, UA: NEGATIVE
Glucose, UA: NEGATIVE
Ketones, UA: NEGATIVE
Leukocytes, UA: NEGATIVE
Nitrite, UA: NEGATIVE
Protein, UA: NEGATIVE
Spec Grav, UA: 1.015 (ref 1.010–1.025)
Urobilinogen, UA: 0.2 E.U./dL
pH, UA: 6 (ref 5.0–8.0)

## 2019-10-25 NOTE — Progress Notes (Signed)
Date:  10/25/2019   Name:  Kristy Sellers   DOB:  [birth year]   MRN:  [id number]   Chief Complaint: Annual Exam (Breast Exam.) Kristy Sellers is a 78 y.o. female who presents today for her annual exam. She feels well. She reports exercising walking regularly. She reports she is sleeping well. She never started Zoloft after last visit.  She adjusted to the comings and goings of Hospice.  Mammogram 09/2019 Colonoscopy DEXA - 05/2015 Immunizations - UTD  Hypertension This is a chronic problem. Progression since onset: BP at home 130/80. The problem is controlled. Associated symptoms include anxiety. Pertinent negatives include no chest pain, neck pain, palpitations, peripheral edema or shortness of breath. There are no associated agents to hypertension. Past treatments include calcium channel blockers. The current treatment provides significant improvement.  Hyperlipidemia Pertinent negatives include no chest pain or shortness of breath.  Gastroesophageal Reflux She reports no abdominal pain, no chest pain, no coughing or no wheezing.  Anxiety Presents for follow-up visit. Patient reports no chest pain, dizziness, nervous/anxious behavior, palpitations or shortness of breath. Symptoms occur rarely. The severity of symptoms is mild. The quality of sleep is good.     Lab Results  Component Value Date   CREATININE 0.86 10/19/2018   BUN 15 10/19/2018   NA 146 (H) 10/19/2018   K 3.9 10/19/2018   CL 106 10/19/2018   CO2 26 10/19/2018   Lab Results  Component Value Date   CHOL 171 10/19/2018   HDL 75 10/19/2018   LDLCALC 84 10/19/2018   TRIG 62 10/19/2018   CHOLHDL 2.3 10/19/2018   Lab Results  Component Value Date   TSH 2.590 10/19/2018     Review of Systems  Constitutional: Negative for chills and fever.  HENT: Negative for congestion, hearing loss, tinnitus, trouble swallowing and voice change.   Eyes: Positive for visual disturbance.  Respiratory: Negative for cough, chest  tightness, shortness of breath and wheezing.   Cardiovascular: Negative for chest pain, palpitations and leg swelling.  Gastrointestinal: Negative for abdominal pain, constipation, diarrhea and vomiting.       Intermittent gerd - takes otc meds prn  Endocrine: Negative for polydipsia and polyuria.  Genitourinary: Negative for dysuria, frequency, genital sores, vaginal bleeding and vaginal discharge.  Musculoskeletal: Negative for arthralgias, gait problem, joint swelling and neck pain.  Skin: Negative for color change and rash.  Allergic/Immunologic: Negative for environmental allergies.  Neurological: Negative for dizziness, tremors and light-headedness.  Hematological: Negative for adenopathy. Does not bruise/bleed easily.  Psychiatric/Behavioral: Negative for dysphoric mood and sleep disturbance. The patient is not nervous/anxious.     Patient Active Problem List   Diagnosis Date Noted  . Gastroesophageal reflux disease 10/25/2019  . Mixed anxiety and depressive disorder 08/24/2019  . 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  . Chronic lumbar radiculopathy 10/15/2017  . Arthritis of knee 10/15/2017  . Primary osteoarthritis of left hip 10/15/2017  . Mixed hyperlipidemia 06/11/2016  . Blind right eye 06/11/2016  . History of Clostridium difficile colitis 06/11/2016  . History of positive PPD 06/11/2016  . Detached retina 05/18/2016  . Pseudoaphakia 05/18/2016  . 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 Ocular hypertension 05/18/2016  . Cataract 05/18/2016  . 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 Essential hypertension 09/27/2014    No Known Allergies  Past Surgical History:  Procedure Laterality Date  . ABDOMINAL HYSTERECTOMY    . COLONOSCOPY  ~2006   normal  . RETINAL DETACHMENT SURGERY  Right     Social History   Tobacco Use  . Smoking status: Former Smoker    Types: Cigarettes    Quit date: 12/30/1983    Years since quitting: 35.8  . Smokeless  tobacco: Never Used  Substance Use Topics  . Alcohol use: No    Alcohol/week: 0.0 standard drinks  . Drug use: Never     Medication list has been reviewed and updated.  Current Meds  Medication Sig  . amLODipine (NORVASC) 5 MG tablet TAKE 1 TABLET BY MOUTH EVERY DAY  . aspirin 81 MG tablet Take 81 mg by mouth daily.  Marland Kitchen atropine 1 % ophthalmic solution PLACE 1 DROP INTO THE RIGHT EYE ONCE DAILY  . dorzolamide (TRUSOPT) 2 % ophthalmic solution Apply to eye.  Marland Kitchen HOMATROPAIRE 5 % ophthalmic solution Place 1 drop into the right eye once. Place in right eye  . prednisoLONE acetate (PRED FORTE) 1 % ophthalmic suspension   . simvastatin (ZOCOR) 10 MG tablet TAKE 1 TABLET BY MOUTH EVERYDAY AT BEDTIME    PHQ 2/9 Scores 10/25/2019 08/24/2019 06/16/2019 02/28/2019  PHQ - 2 Score 0 2 0 2  PHQ- 9 Score 2 4 - 3    BP Readings from Last 3 Encounters:  10/25/19 132/74  08/24/19 118/72  06/16/19 126/70    Physical Exam Vitals signs and nursing note reviewed.  Constitutional:      General: She is not in acute distress.    Appearance: She is well-developed.  HENT:     Head: Normocephalic and atraumatic.     Right Ear: Tympanic membrane and ear canal normal.     Left Ear: Tympanic membrane and ear canal normal.     Nose:     Right Sinus: No maxillary sinus tenderness.     Left Sinus: No maxillary sinus tenderness.  Eyes:     General: No scleral icterus.       Right eye: No discharge.        Left eye: No discharge.     Conjunctiva/sclera: Conjunctivae normal.  Neck:     Musculoskeletal: Normal range of motion. No erythema.     Thyroid: No thyromegaly.     Vascular: No carotid bruit.  Cardiovascular:     Rate and Rhythm: Normal rate and regular rhythm.     Pulses: Normal pulses.     Heart sounds: Normal heart sounds.  Pulmonary:     Effort: Pulmonary effort is normal. No respiratory distress.     Breath sounds: No wheezing.  Chest:     Breasts:        Right: No mass, nipple  discharge, skin change or tenderness.        Left: No mass, nipple discharge, skin change or tenderness.  Abdominal:     General: Bowel sounds are normal.     Palpations: Abdomen is soft.     Tenderness: There is no abdominal tenderness.  Musculoskeletal:     Right lower leg: No edema.     Left lower leg: No edema.  Lymphadenopathy:     Cervical: No cervical adenopathy.  Skin:    General: Skin is warm and dry.     Capillary Refill: Capillary refill takes less than 2 seconds.     Findings: No rash.  Neurological:     General: No focal deficit present.     Mental Status: She is alert and oriented to person, place, and time.     Cranial Nerves: No cranial nerve deficit.  Sensory: No sensory deficit.     Deep Tendon Reflexes: Reflexes are normal and symmetric.  Psychiatric:        Speech: Speech normal.        Behavior: Behavior normal.        Thought Content: Thought content normal.     Wt Readings from Last 3 Encounters:  10/25/19 164 lb (74.4 kg)  08/24/19 164 lb (74.4 kg)  06/16/19 168 lb (76.2 kg)    BP 132/74   Pulse 62   Ht 5\' 5"  (1.651 m)   Wt 164 lb (74.4 kg)   SpO2 97%   BMI 27.29 kg/m   Assessment and Plan: 1. Essential hypertension Clinically stable exam with well controlled BP.   Tolerating medications, amlodipine 5 mg, without side effects at this time. Pt to continue current regimen and low sodium diet; benefits of regular exercise as able discussed. - CBC with Differential/Platelet - Comprehensive metabolic panel - POCT urinalysis dipstick  2. Gastroesophageal reflux disease, unspecified whether esophagitis present Symptoms well controlled on daily PPI No red flag signs such as weight loss, n/v, melena Will continue prn otc omeprazole. - CBC with Differential/Platelet  3. Mixed hyperlipidemia Tolerating statin medication without side effects at this time LDL is at goal of < 70 on current dose Continue same therapy without change at this time.  - Lipid panel  4. Mixed anxiety and depressive disorder Symptoms are much improved - pt has adjusted to new situation at home No medication is needed at this time. - TSH + free T4   Partially dictated using Editor, commissioning. Any errors are unintentional.  Halina Maidens, MD [facility]  10/25/2019

## 2019-10-26 LAB — COMPREHENSIVE METABOLIC PANEL
ALT: 6 IU/L (ref 0–32)
AST: 10 IU/L (ref 0–40)
Albumin/Globulin Ratio: 1.5 (ref 1.2–2.2)
Albumin: 4 g/dL (ref 3.7–4.7)
Alkaline Phosphatase: 79 IU/L (ref 39–117)
BUN/Creatinine Ratio: 17 (ref 12–28)
BUN: 14 mg/dL (ref 8–27)
Bilirubin Total: 0.5 mg/dL (ref 0.0–1.2)
CO2: 24 mmol/L (ref 20–29)
Calcium: 9.1 mg/dL (ref 8.7–10.3)
Chloride: 106 mmol/L (ref 96–106)
Creatinine, Ser: 0.81 mg/dL (ref 0.57–1.00)
GFR calc Af Amer: 80 mL/min/{1.73_m2} (ref >59)
GFR calc non Af Amer: 70 mL/min/{1.73_m2} (ref >59)
Globulin, Total: 2.6 g/dL (ref 1.5–4.5)
Glucose: 86 mg/dL (ref 65–99)
Potassium: 3.9 mmol/L (ref 3.5–5.2)
Sodium: 143 mmol/L (ref 134–144)
Total Protein: 6.6 g/dL (ref 6.0–8.5)

## 2019-10-26 LAB — CBC WITH DIFFERENTIAL/PLATELET
Basophils Absolute: 0 10*3/uL (ref 0.0–0.2)
Basos: 0 %
EOS (ABSOLUTE): 0.2 10*3/uL (ref 0.0–0.4)
Eos: 2 %
Hematocrit: 38.4 % (ref 34.0–46.6)
Hemoglobin: 12.1 g/dL (ref 11.1–15.9)
Immature Grans (Abs): 0 10*3/uL (ref 0.0–0.1)
Immature Granulocytes: 0 %
Lymphocytes Absolute: 1.8 10*3/uL (ref 0.7–3.1)
Lymphs: 23 %
MCH: 25.7 pg — ABNORMAL LOW (ref 26.6–33.0)
MCHC: 31.5 g/dL (ref 31.5–35.7)
MCV: 82 fL (ref 79–97)
Monocytes Absolute: 0.6 10*3/uL (ref 0.1–0.9)
Monocytes: 7 %
Neutrophils Absolute: 5.4 10*3/uL (ref 1.4–7.0)
Neutrophils: 68 %
Platelets: 273 10*3/uL (ref 150–450)
RBC: 4.71 x10E6/uL (ref 3.77–5.28)
RDW: 14.6 % (ref 11.7–15.4)
WBC: 8 10*3/uL (ref 3.4–10.8)

## 2019-10-26 LAB — LIPID PANEL
Chol/HDL Ratio: 2 ratio (ref 0.0–4.4)
Cholesterol, Total: 143 mg/dL (ref 100–199)
HDL: 70 mg/dL (ref >39)
LDL Chol Calc (NIH): 62 mg/dL (ref 0–99)
Triglycerides: 52 mg/dL (ref 0–149)
VLDL Cholesterol Cal: 11 mg/dL (ref 5–40)

## 2019-10-26 LAB — TSH+FREE T4
Free T4: 1.22 ng/dL (ref 0.82–1.77)
TSH: 1.79 u[IU]/mL (ref 0.450–4.500)

## 2020-01-13 ENCOUNTER — Other Ambulatory Visit: Payer: Self-pay

## 2020-01-13 DIAGNOSIS — Z1211 Encounter for screening for malignant neoplasm of colon: Secondary | ICD-10-CM

## 2020-02-01 DIAGNOSIS — H18421 Band keratopathy, right eye: Secondary | ICD-10-CM | POA: Diagnosis not present

## 2020-02-01 DIAGNOSIS — H3321 Serous retinal detachment, right eye: Secondary | ICD-10-CM | POA: Diagnosis not present

## 2020-02-01 DIAGNOSIS — H209 Unspecified iridocyclitis: Secondary | ICD-10-CM | POA: Diagnosis not present

## 2020-02-01 DIAGNOSIS — H34832 Tributary (branch) retinal vein occlusion, left eye, with macular edema: Secondary | ICD-10-CM | POA: Diagnosis not present

## 2020-02-01 DIAGNOSIS — H44112 Panuveitis, left eye: Secondary | ICD-10-CM | POA: Diagnosis not present

## 2020-02-29 ENCOUNTER — Ambulatory Visit: Payer: Medicare Other

## 2020-04-02 ENCOUNTER — Telehealth: Payer: Self-pay

## 2020-04-02 NOTE — Telephone Encounter (Signed)
Called patient to schedule a follow up on BP and depression. She said her husband is currently on hospice and she is his caregiver so is needing to stay home with him at this time. She will call and schedule an appt when she is able.  CM

## 2020-04-11 ENCOUNTER — Ambulatory Visit: Payer: Medicare Other

## 2020-05-14 ENCOUNTER — Other Ambulatory Visit: Payer: Self-pay

## 2020-05-14 ENCOUNTER — Ambulatory Visit (INDEPENDENT_AMBULATORY_CARE_PROVIDER_SITE_OTHER): Payer: Medicare Other | Admitting: Internal Medicine

## 2020-05-14 ENCOUNTER — Encounter: Payer: Self-pay | Admitting: Internal Medicine

## 2020-05-14 ENCOUNTER — Ambulatory Visit (INDEPENDENT_AMBULATORY_CARE_PROVIDER_SITE_OTHER): Payer: Medicare Other

## 2020-05-14 VITALS — BP 132/78 | HR 75 | Ht 65.0 in | Wt 165.4 lb

## 2020-05-14 VITALS — BP 132/78 | HR 75 | Temp 97.4°F | Resp 16 | Ht 65.0 in | Wt 165.4 lb

## 2020-05-14 DIAGNOSIS — F39 Unspecified mood [affective] disorder: Secondary | ICD-10-CM

## 2020-05-14 DIAGNOSIS — Z23 Encounter for immunization: Secondary | ICD-10-CM | POA: Diagnosis not present

## 2020-05-14 DIAGNOSIS — K219 Gastro-esophageal reflux disease without esophagitis: Secondary | ICD-10-CM | POA: Insufficient documentation

## 2020-05-14 DIAGNOSIS — I10 Essential (primary) hypertension: Secondary | ICD-10-CM | POA: Diagnosis not present

## 2020-05-14 DIAGNOSIS — Z0000 Encounter for general adult medical examination without abnormal findings: Secondary | ICD-10-CM | POA: Diagnosis not present

## 2020-05-14 MED ORDER — AMLODIPINE BESYLATE 5 MG PO TABS: 5.0000 mg | ORAL_TABLET | Freq: Every day | ORAL | 3 refills | Status: DC

## 2020-05-14 MED ORDER — SHINGRIX 50 MCG/0.5ML IM SUSR: 0.5000 mL | Freq: Once | INTRAMUSCULAR | 1 refills | Status: AC

## 2020-05-14 MED ORDER — FAMOTIDINE 20 MG PO TABS: 20.0000 mg | ORAL_TABLET | Freq: Two times a day (BID) | ORAL | 1 refills | Status: DC

## 2020-05-14 NOTE — Progress Notes (Signed)
Date:  05/14/2020   Name:  Kristy Sellers   DOB:  15-Dec-1941   MRN:  [id number]   Chief Complaint: No chief complaint on file.  Hypertension This is a chronic problem. The problem is controlled. Pertinent negatives include no chest pain, palpitations or shortness of breath. Past treatments include calcium channel blockers. The current treatment provides significant improvement. There are no compliance problems.  There is no history of kidney disease, CAD/MI or CVA.  Gastroesophageal Reflux She reports no chest pain, no choking, no heartburn, no nausea or no wheezing. This is a recurrent problem. The problem occurs rarely. Pertinent negatives include no fatigue. She has tried a histamine-2 antagonist for the symptoms.   Mood disorder - her husband passed away a few weeks ago.  She is a very social person and is struggling some with the isolation.  She mostly complains of sleep issues with premature waking and then taking a while to get back to sleep. She does have supportive neighbors and friends who have been keeping in touch.  Lab Results  Component Value Date   CREATININE 0.81 10/25/2019   BUN 14 10/25/2019   NA 143 10/25/2019   K 3.9 10/25/2019   CL 106 10/25/2019   CO2 24 10/25/2019   Lab Results  Component Value Date   CHOL 143 10/25/2019   HDL 70 10/25/2019   LDLCALC 62 10/25/2019   TRIG 52 10/25/2019   CHOLHDL 2.0 10/25/2019   Lab Results  Component Value Date   TSH 1.790 10/25/2019   No results found for: HGBA1C Lab Results  Component Value Date   WBC 8.0 10/25/2019   HGB 12.1 10/25/2019   HCT 38.4 10/25/2019   MCV 82 10/25/2019   PLT 273 10/25/2019   Lab Results  Component Value Date   ALT 6 10/25/2019   AST 10 10/25/2019   ALKPHOS 79 10/25/2019   BILITOT 0.5 10/25/2019     Review of Systems  Constitutional: Negative for chills, fatigue and fever.  HENT: Negative for trouble swallowing.   Respiratory: Negative for choking, shortness of breath and  wheezing.   Cardiovascular: Negative for chest pain and palpitations.  Gastrointestinal: Negative for abdominal distention, constipation, diarrhea, heartburn, nausea and vomiting.  Allergic/Immunologic: Negative for environmental allergies.  Neurological: Negative for dizziness.  Psychiatric/Behavioral: Positive for sleep disturbance. Negative for dysphoric mood. The patient is not nervous/anxious.     Patient Active Problem List   Diagnosis Date Noted  . Gastroesophageal reflux disease 10/25/2019  . Mixed anxiety and depressive disorder 08/24/2019  . 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  . Chronic lumbar radiculopathy 10/15/2017  . Arthritis of knee 10/15/2017  . Primary osteoarthritis of left hip 10/15/2017  . Mixed hyperlipidemia 06/11/2016  . Blind right eye 06/11/2016  . History of Clostridium difficile colitis 06/11/2016  . History of positive PPD 06/11/2016  . Detached retina 05/18/2016  . Pseudoaphakia 05/18/2016  . 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 Ocular hypertension 05/18/2016  . Cataract 05/18/2016  . 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 Essential hypertension 09/27/2014     No Known Allergies  Past Surgical History:  Procedure Laterality Date  . ABDOMINAL HYSTERECTOMY    . COLONOSCOPY  ~2006   normal  . RETINAL DETACHMENT SURGERY Right     Social History   Tobacco Use  . Smoking status: Former Smoker    Types: Cigarettes    Quit date: 12/30/1983    Years since quitting: 36.3  .  Smokeless tobacco: Never Used  Substance Use Topics  . Alcohol use: No    Alcohol/week: 0.0 standard drinks  . Drug use: Never     Medication list has been reviewed and updated.  Current Meds  Medication Sig  . amLODipine (NORVASC) 5 MG tablet TAKE 1 TABLET BY MOUTH EVERY DAY  . aspirin 81 MG tablet Take 81 mg by mouth daily.  Marland Kitchen atropine 1 % ophthalmic solution PLACE 1 DROP INTO THE RIGHT EYE ONCE DAILY  . famotidine (PEPCID) 20 MG tablet  Take 20 mg by mouth 2 (two) times daily.  . prednisoLONE acetate (PRED FORTE) 1 % ophthalmic suspension   . simvastatin (ZOCOR) 10 MG tablet TAKE 1 TABLET BY MOUTH EVERYDAY AT BEDTIME    PHQ 2/9 Scores 05/14/2020 10/25/2019 08/24/2019 06/16/2019  PHQ - 2 Score 5 0 2 0  PHQ- 9 Score 9 2 4  -    BP Readings from Last 3 Encounters:  05/14/20 132/78  05/14/20 132/78  10/25/19 132/74    Physical Exam Vitals and nursing note reviewed.  Constitutional:      General: She is not in acute distress.    Appearance: Normal appearance. She is well-developed.  HENT:     Head: Normocephalic and atraumatic.  Cardiovascular:     Rate and Rhythm: Normal rate and regular rhythm.     Pulses: Normal pulses.     Heart sounds: No murmur.  Pulmonary:     Effort: Pulmonary effort is normal. No respiratory distress.  Musculoskeletal:     Cervical back: Normal range of motion.     Right lower leg: No edema.     Left lower leg: No edema.  Skin:    General: Skin is warm and dry.     Findings: No rash.  Neurological:     Mental Status: She is alert and oriented to person, place, and time.  Psychiatric:        Behavior: Behavior normal.        Thought Content: Thought content normal.     Wt Readings from Last 3 Encounters:  05/14/20 165 lb 6.4 oz (75 kg)  05/14/20 165 lb 6.4 oz (75 kg)  10/25/19 164 lb (74.4 kg)    BP 132/78   Pulse 75   Ht 5\' 5"  (1.651 m)   Wt 165 lb 6.4 oz (75 kg)   SpO2 98%   BMI 27.52 kg/m   Assessment and Plan: 1. Essential hypertension Clinically stable exam with well controlled BP on amlodipine. Tolerating medications without side effects at this time. Pt to continue current regimen and low sodium diet; benefits of regular exercise as able discussed. - amLODipine (NORVASC) 5 MG tablet; Take 1 tablet (5 mg total) by mouth daily.  Dispense: 90 tablet; Refill: 3  2. Gastroesophageal reflux disease without esophagitis Symptoms well controlled. No red flag signs such  as weight loss, n/v, melena Will continue H2 blocker. - famotidine (PEPCID) 20 MG tablet; Take 1 tablet (20 mg total) by mouth 2 (two) times daily.  Dispense: 180 tablet; Refill: 1  3. Mood disorder ([place]) Appropriate to the situation with husbands death Recommend trying Melatonin for sleep Follow up if needed   4. Need for shingles vaccine - Zoster Vaccine Adjuvanted [facility]) injection; Inject 0.5 mLs into the muscle once for 1 dose.  Dispense: 0.5 mL; Refill: 1   Partially dictated using Editor, commissioning. Any errors are unintentional.  Halina Maidens, MD [facility]  05/14/2020

## 2020-05-14 NOTE — Patient Instructions (Signed)
Melatonin 5-10 mg at bedtime to help with sleep

## 2020-05-14 NOTE — Patient Instructions (Signed)
[name] , Thank you for taking time to come for your Medicare Wellness Visit. I appreciate your ongoing commitment to your health goals. Please review the following plan we discussed and let me know if I can assist you in the future.   Screening recommendations/referrals: Colonoscopy: no longer required  Mammogram: done 10/10/19 Bone Density: done 05/30/15 Recommended yearly ophthalmology/optometry visit for glaucoma screening and checkup Recommended yearly dental visit for hygiene and checkup  Vaccinations: Influenza vaccine: done 09/21/19 Pneumococcal vaccine: done 10/15/17 Tdap vaccine: done 07/10/14 Shingles vaccine: Shingrix discussed. Please contact your pharmacy for coverage information.  Covid-19: done 12/31/19 & 01/21/20  Advanced directives: Please bring a copy of your health care power of attorney and living will to the office at your convenience.  Conditions/risks identified: Recommend increasing physical activity  Next appointment: Please follow up in one year for your Medicare Annual Wellness visit.     Preventive Care 80 Years and Older, Female Preventive care refers to lifestyle choices and visits with your health care provider that can promote health and wellness. What does preventive care include?  A yearly physical exam. This is also called an annual well check.  Dental exams once or twice a year.  Routine eye exams. Ask your health care provider how often you should have your eyes checked.  Personal lifestyle choices, including:  Daily care of your teeth and gums.  Regular physical activity.  Eating a healthy diet.  Avoiding tobacco and drug use.  Limiting alcohol use.  Practicing safe sex.  Taking low-dose aspirin every day.  Taking vitamin and mineral supplements as recommended by your health care provider. What happens during an annual well check? The services and screenings done by your health care provider during your annual well check will  depend on your age, overall health, lifestyle risk factors, and family history of disease. Counseling  Your health care provider may ask you questions about your:  Alcohol use.  Tobacco use.  Drug use.  Emotional well-being.  Home and relationship well-being.  Sexual activity.  Eating habits.  History of falls.  Memory and ability to understand (cognition).  Work and work Statistician.  Reproductive health. Screening  You may have the following tests or measurements:  Height, weight, and BMI.  Blood pressure.  Lipid and cholesterol levels. These may be checked every 5 years, or more frequently if you are over 70 years old.  Skin check.  Lung cancer screening. You may have this screening every year starting at age 32 if you have a 30-pack-year history of smoking and currently smoke or have quit within the past 15 years.  Fecal occult blood test (FOBT) of the stool. You may have this test every year starting at age 39.  Flexible sigmoidoscopy or colonoscopy. You may have a sigmoidoscopy every 5 years or a colonoscopy every 10 years starting at age 82.  Hepatitis C blood test.  Hepatitis B blood test.  Sexually transmitted disease (STD) testing.  Diabetes screening. This is done by checking your blood sugar (glucose) after you have not eaten for a while (fasting). You may have this done every 1-3 years.  Bone density scan. This is done to screen for osteoporosis. You may have this done starting at age 45.  Mammogram. This may be done every 1-2 years. Talk to your health care provider about how often you should have regular mammograms. Talk with your health care provider about your test results, treatment options, and if necessary, the need for more tests.  Vaccines  Your health care provider may recommend certain vaccines, such as:  Influenza vaccine. This is recommended every year.  Tetanus, diphtheria, and acellular pertussis (Tdap, Td) vaccine. You may need a  Td booster every 10 years.  Zoster vaccine. You may need this after age 39.  Pneumococcal 13-valent conjugate (PCV13) vaccine. One dose is recommended after age 33.  Pneumococcal polysaccharide (PPSV23) vaccine. One dose is recommended after age 1. Talk to your health care provider about which screenings and vaccines you need and how often you need them. This information is not intended to replace advice given to you by your health care provider. Make sure you discuss any questions you have with your health care provider. Document Released: 01/11/2016 Document Revised: 09/03/2016 Document Reviewed: 10/16/2015 Elsevier Interactive Patient Education  2017 [place] Prevention in the Home Falls can cause injuries. They can happen to people of all ages. There are many things you can do to make your home safe and to help prevent falls. What can I do on the outside of my home?  Regularly fix the edges of walkways and driveways and fix any cracks.  Remove anything that might make you trip as you walk through a door, such as a raised step or threshold.  Trim any bushes or trees on the path to your home.  Use bright outdoor lighting.  Clear any walking paths of anything that might make someone trip, such as rocks or tools.  Regularly check to see if handrails are loose or broken. Make sure that both sides of any steps have handrails.  Any raised decks and porches should have guardrails on the edges.  Have any leaves, snow, or ice cleared regularly.  Use sand or salt on walking paths during winter.  Clean up any spills in your garage right away. This includes oil or grease spills. What can I do in the bathroom?  Use night lights.  Install grab bars by the toilet and in the tub and shower. Do not use towel bars as grab bars.  Use non-skid mats or decals in the tub or shower.  If you need to sit down in the shower, use a plastic, non-slip stool.  Keep the floor dry. Clean  up any water that spills on the floor as soon as it happens.  Remove soap buildup in the tub or shower regularly.  Attach bath mats securely with double-sided non-slip rug tape.  Do not have throw rugs and other things on the floor that can make you trip. What can I do in the bedroom?  Use night lights.  Make sure that you have a light by your bed that is easy to reach.  Do not use any sheets or blankets that are too big for your bed. They should not hang down onto the floor.  Have a firm chair that has side arms. You can use this for support while you get dressed.  Do not have throw rugs and other things on the floor that can make you trip. What can I do in the kitchen?  Clean up any spills right away.  Avoid walking on wet floors.  Keep items that you use a lot in easy-to-reach places.  If you need to reach something above you, use a strong step stool that has a grab bar.  Keep electrical cords out of the way.  Do not use floor polish or wax that makes floors slippery. If you must use wax, use non-skid floor wax.  Do not have throw rugs and other things on the floor that can make you trip. What can I do with my stairs?  Do not leave any items on the stairs.  Make sure that there are handrails on both sides of the stairs and use them. Fix handrails that are broken or loose. Make sure that handrails are as long as the stairways.  Check any carpeting to make sure that it is firmly attached to the stairs. Fix any carpet that is loose or worn.  Avoid having throw rugs at the top or bottom of the stairs. If you do have throw rugs, attach them to the floor with carpet tape.  Make sure that you have a light switch at the top of the stairs and the bottom of the stairs. If you do not have them, ask someone to add them for you. What else can I do to help prevent falls?  Wear shoes that:  Do not have high heels.  Have rubber bottoms.  Are comfortable and fit you well.  Are  closed at the toe. Do not wear sandals.  If you use a stepladder:  Make sure that it is fully opened. Do not climb a closed stepladder.  Make sure that both sides of the stepladder are locked into place.  Ask someone to hold it for you, if possible.  Clearly mark and make sure that you can see:  Any grab bars or handrails.  First and last steps.  Where the edge of each step is.  Use tools that help you move around (mobility aids) if they are needed. These include:  Canes.  Walkers.  Scooters.  Crutches.  Turn on the lights when you go into a dark area. Replace any light bulbs as soon as they burn out.  Set up your furniture so you have a clear path. Avoid moving your furniture around.  If any of your floors are uneven, fix them.  If there are any pets around you, be aware of where they are.  Review your medicines with your doctor. Some medicines can make you feel dizzy. This can increase your chance of falling. Ask your doctor what other things that you can do to help prevent falls. This information is not intended to replace advice given to you by your health care provider. Make sure you discuss any questions you have with your health care provider. Document Released: 10/11/2009 Document Revised: 05/22/2016 Document Reviewed: 01/19/2015 Elsevier Interactive Patient Education  2017 [employer].

## 2020-05-14 NOTE — Progress Notes (Signed)
Subjective:   Kristy Sellers is a 79 y.o. female who presents for Medicare Annual (Subsequent) preventive examination.  Review of Systems:   Cardiac Risk Factors include: advanced age (>44men, >67 women);dyslipidemia     Objective:     Vitals: BP 132/78 (BP Location: Right Arm, Patient Position: Sitting, Cuff Size: Normal)   Pulse 75   Temp (!) 97.4 F (36.3 C) (Temporal)   Resp 16   Ht 5\' 5"  (1.651 m)   Wt 165 lb 6.4 oz (75 kg)   SpO2 98%   BMI 27.52 kg/m   Body mass index is 27.52 kg/m.  Advanced Directives 05/14/2020 02/28/2019 10/14/2016 09/06/2015 09/06/2015  Does Patient Have a Medical Advance Directive? Yes Yes No No No  Type of Paramedic of [place];Living will [place];Living will - - -  Copy of [place] in Chart? No - copy requested No - copy requested - - -  Would patient like information on creating a medical advance directive? - - - Yes - Scientist, clinical (histocompatibility and immunogenetics) given;Yes - Spiritual care consult ordered Yes - Educational materials given;Yes - Spiritual care consult ordered    Tobacco Social History   Tobacco Use  Smoking Status Former Smoker  . Types: Cigarettes  . Quit date: 12/30/1983  . Years since quitting: 36.3  Smokeless Tobacco Never Used     Counseling given: Not Answered   Clinical Intake:  Pre-visit preparation completed: No  Pain : No/denies pain     BMI - recorded: 27.52 Nutritional Status: BMI 25 -29 Overweight Nutritional Risks: None Diabetes: No  How often do you need to have someone help you when you read instructions, pamphlets, or other written materials from your doctor or pharmacy?: 1 - Never  Interpreter Needed?: No  Information entered by :: Clemetine Marker LPN  Past Medical History:  Diagnosis Date  . GERD (gastroesophageal reflux disease)   . History of detached retina repair   . Hyperlipidemia   . Hypertension    Past Surgical History:  Procedure Laterality  Date  . ABDOMINAL HYSTERECTOMY    . COLONOSCOPY  ~2006   normal  . RETINAL DETACHMENT SURGERY Right    Family History  Problem Relation Age of Onset  . Parkinson's disease Mother   . CAD Father   . CAD Son        50  . Breast cancer Neg Hx    Social History   Socioeconomic History  . Marital status: Widowed    Spouse name: Not on file  . Number of children: 4  . Years of education: Not on file  . Highest education level: Some college, no degree  Occupational History  . Not on file  Tobacco Use  . Smoking status: Former Smoker    Types: Cigarettes    Quit date: 12/30/1983    Years since quitting: 36.3  . Smokeless tobacco: Never Used  Substance and Sexual Activity  . Alcohol use: No    Alcohol/week: 0.0 standard drinks  . Drug use: Never  . Sexual activity: Not Currently  Other Topics Concern  . Not on file  Social History Narrative   Retired. Husband of 60 years passed away from lung cancer May 09, 2020.    Social Determinants of Health   Financial Resource Strain: Low Risk   . Difficulty of Paying Living Expenses: Not hard at all  Food Insecurity: No Food Insecurity  . Worried About Charity fundraiser in the Last Year: Never  true  . Ran Out of Food in the Last Year: Never true  Transportation Needs: No Transportation Needs  . Lack of Transportation (Medical): No  . Lack of Transportation (Non-Medical): No  Physical Activity: Inactive  . Days of Exercise per Week: 0 days  . Minutes of Exercise per Session: 0 min  Stress: Stress Concern Present  . Feeling of Stress : To some extent  Social Connections: Unknown  . Frequency of Communication with Friends and Family: Patient refused  . Frequency of Social Gatherings with Friends and Family: Patient refused  . Attends Religious Services: Patient refused  . Active Member of Clubs or Organizations: Patient refused  . Attends Archivist Meetings: Patient refused  . Marital Status: Widowed    Outpatient  Encounter Medications as of 05/14/2020  Medication Sig  . amLODipine (NORVASC) 5 MG tablet TAKE 1 TABLET BY MOUTH EVERY DAY  . aspirin 81 MG tablet Take 81 mg by mouth daily.  Marland Kitchen atropine 1 % ophthalmic solution PLACE 1 DROP INTO THE RIGHT EYE ONCE DAILY  . prednisoLONE acetate (PRED FORTE) 1 % ophthalmic suspension   . simvastatin (ZOCOR) 10 MG tablet TAKE 1 TABLET BY MOUTH EVERYDAY AT BEDTIME  . [DISCONTINUED] dorzolamide (TRUSOPT) 2 % ophthalmic solution Apply to eye.  . [DISCONTINUED] HOMATROPAIRE 5 % ophthalmic solution Place 1 drop into the right eye once. Place in right eye   No facility-administered encounter medications on file as of 05/14/2020.    Activities of Daily Living In your present state of health, do you have any difficulty performing the following activities: 05/14/2020  Hearing? N  Comment declines hearing aids  Vision? N  Difficulty concentrating or making decisions? N  Walking or climbing stairs? N  Dressing or bathing? N  Doing errands, shopping? N  Preparing Food and eating ? N  Using the Toilet? N  In the past six months, have you accidently leaked urine? N  Do you have problems with loss of bowel control? N  Managing your Medications? N  Managing your Finances? N  Housekeeping or managing your Housekeeping? N  Some recent data might be hidden    Patient Care Team: Glean Hess, MD as PCP - General (Family Medicine)    Assessment:   This is a routine wellness examination for Kristy Sellers.  Exercise Activities and Dietary recommendations Current Exercise Habits: The patient does not participate in regular exercise at present, Exercise limited by: None identified  Goals    . Patient Stated     Patient states she would like to be more socially involved with volunteering and within her community. Also have more personal time.        Fall Risk Fall Risk  05/14/2020 06/16/2019 02/28/2019 10/19/2018 10/15/2017  Falls in the past year? 0 1 0 No No  Number  falls in past yr: 0 1 0 - -  Injury with Fall? 0 1 0 - -  Risk for fall due to : No Fall Risks History of fall(s);Impaired balance/gait;Impaired mobility - - -  Follow up Falls prevention discussed Falls evaluation completed Falls prevention discussed - -   FALL RISK PREVENTION PERTAINING TO THE HOME:  Any stairs in or around the home? Yes  If so, do they handrails? Yes   Home free of loose throw rugs in walkways, pet beds, electrical cords, etc? Yes  Adequate lighting in your home to reduce risk of falls? Yes   ASSISTIVE DEVICES UTILIZED TO PREVENT FALLS:  Life alert? No  Use of a cane, walker or w/c? No  Grab bars in the bathroom? No  Shower chair or bench in shower? No  Elevated toilet seat or a handicapped toilet? No   DME ORDERS:  DME order needed?  No   TIMED UP AND GO:  Was the test performed? Yes .  Length of time to ambulate 10 feet: 5 sec.   GAIT:  Appearance of gait: Gait stead-fast and without the use of an assistive device.    Education: Fall risk prevention has been discussed.  Intervention(s) required? No   Depression Screen PHQ 2/9 Scores 05/14/2020 10/25/2019 08/24/2019 06/16/2019  PHQ - 2 Score 5 0 2 0  PHQ- 9 Score 9 2 4  -     Cognitive Function     6CIT Screen 02/28/2019 10/15/2017 10/14/2016  What Year? 0 points 0 points 0 points  What month? 0 points 0 points 0 points  What time? 0 points 0 points 0 points  Count back from 20 0 points 0 points 0 points  Months in reverse 0 points 0 points 0 points  Repeat phrase 0 points 4 points 0 points  Total Score 0 4 0    Immunization History  Administered Date(s) Administered  . Fluad Quad(high Dose 65+) 09/21/2019  . Influenza, High Dose Seasonal PF 09/17/2018  . Influenza,inj,Quad PF,6+ Mos 10/14/2016, 10/15/2017  . PFIZER SARS-COV-2 Vaccination 12/31/2019, 01/21/2020  . Pneumococcal Conjugate-13 10/15/2017  . Pneumococcal-Unspecified 12/29/2013  . Tdap 07/10/2014  . Zoster 09/28/2013     Qualifies for Shingles Vaccine? Yes  Zostavax completed 2014. Due for Shingrix. Education has been provided regarding the importance of this vaccine. Pt has been advised to call insurance company to determine out of pocket expense. Advised may also receive vaccine at local pharmacy or Health Dept. Verbalized acceptance and understanding.  Tdap: Up to date  Flu Vaccine: Up to date  Pneumococcal Vaccine: Up to date  Covid-19 Vaccine: Up to date   Screening Tests Health Maintenance  Topic Date Due  . INFLUENZA VACCINE  07/29/2020  . TETANUS/TDAP  07/10/2024  . DEXA SCAN  Completed  . COVID-19 Vaccine  Completed  . PNA vac Low Risk Adult  Completed    Cancer Screenings:  Colorectal Screening: No longer required.   Mammogram: Completed 10/10/19. Repeat every year   Bone Density: Completed 05/30/15. Results reflect NORMAL. Pt declines repeat screening at this time.    Lung Cancer Screening: (Low Dose CT Chest recommended if Age 10-80 years, 30 pack-year currently smoking OR have quit w/in 15years.) does not qualify.   Additional Screening:  Hepatitis C Screening: no longer required  Vision Screening: Recommended annual ophthalmology exams for early detection of glaucoma and other disorders of the eye. Is the patient up to date with their annual eye exam?  Yes  Who is the provider or what is the name of the office in which the pt attends annual eye exams? Dr. Loretta Plume  Dental Screening: Recommended annual dental exams for proper oral hygiene  Community Resource Referral:  CRR required this visit?  No      Plan:     I have personally reviewed and addressed the Medicare Annual Wellness questionnaire and have noted the following in the patient's chart:  A. Medical and social history B. Use of alcohol, tobacco or illicit drugs  C. Current medications and supplements D. Functional ability and status E.  Nutritional status F.  Physical activity G. Advance  directives H. List of other physicians I.  Hospitalizations, surgeries, and ER visits in previous 12 months J.  [place] such as hearing and vision if needed, cognitive and depression L. Referrals and appointments   In addition, I have reviewed and discussed with patient certain preventive protocols, quality metrics, and best practice recommendations. A written personalized care plan for preventive services as well as general preventive health recommendations were provided to patient.   Signed,  Clemetine Marker, LPN Nurse Health Advisor   Nurse Notes: pt states husband passed away 3 weeks ago and she is having a hard time with the loneliness and the house being quiet. She is trying to cope in different ways and has support and resources from hospice. Pt advised community resources available as well if needed. Pt appreciative of visit today.

## 2020-06-13 ENCOUNTER — Telehealth: Payer: Self-pay | Admitting: Internal Medicine

## 2020-06-13 NOTE — Telephone Encounter (Signed)
Called pt and schedule appt for 06/14/2020.  KP

## 2020-06-13 NOTE — Telephone Encounter (Signed)
Please Advise. Last office visit 05/14/2020. ° °KP

## 2020-06-13 NOTE — Telephone Encounter (Signed)
Patient called to ask the doctor if she could send in a script for anxiety/depression.  She stated that the doctor had asked at her last visit if she wanted anything since her husband had just died, but she had declined at that time.  Patient now states that she thinks she needs something for her anxiety now.  Please call patient to discuss at [phone]

## 2020-06-13 NOTE — Telephone Encounter (Signed)
She probably needs to be seen to discuss exactly what sx she has and what medication will be best for her.  If it just for sleep, I can send her in a prescription.

## 2020-06-14 ENCOUNTER — Other Ambulatory Visit: Payer: Self-pay

## 2020-06-14 ENCOUNTER — Ambulatory Visit (INDEPENDENT_AMBULATORY_CARE_PROVIDER_SITE_OTHER): Payer: Medicare Other | Admitting: Internal Medicine

## 2020-06-14 ENCOUNTER — Encounter: Payer: Self-pay | Admitting: Internal Medicine

## 2020-06-14 ENCOUNTER — Other Ambulatory Visit: Payer: Self-pay | Admitting: Internal Medicine

## 2020-06-14 VITALS — BP 118/72 | HR 69 | Temp 98.3°F | Ht 65.0 in | Wt 165.0 lb

## 2020-06-14 DIAGNOSIS — F5102 Adjustment insomnia: Secondary | ICD-10-CM | POA: Diagnosis not present

## 2020-06-14 DIAGNOSIS — E785 Hyperlipidemia, unspecified: Secondary | ICD-10-CM

## 2020-06-14 DIAGNOSIS — F4321 Adjustment disorder with depressed mood: Secondary | ICD-10-CM | POA: Diagnosis not present

## 2020-06-14 MED ORDER — CLONAZEPAM 0.5 MG PO TABS: 0.5000 mg | ORAL_TABLET | Freq: Every day | ORAL | 0 refills | Status: DC

## 2020-06-14 NOTE — Progress Notes (Signed)
Date:  06/14/2020   Name:  Kristy Sellers   DOB:  [birth year]   MRN:  [id number]   Chief Complaint: Anxiety (husband died x2 months ago first time living alone, roller coaster ride with feelings, sadness overcomes her ) and Depression  Depression        This is a new problem.  The current episode started more than 1 month ago.   The onset quality is sudden (after her husband died in Apr 29, 2023).   The problem has been gradually worsening since onset.  Associated symptoms include insomnia, irritable and sad.  Associated symptoms include no fatigue, no appetite change and no suicidal ideas.  Past treatments include nothing.   Lab Results  Component Value Date   CREATININE 0.81 10/25/2019   BUN 14 10/25/2019   NA 143 10/25/2019   K 3.9 10/25/2019   CL 106 10/25/2019   CO2 24 10/25/2019   Lab Results  Component Value Date   CHOL 143 10/25/2019   HDL 70 10/25/2019   LDLCALC 62 10/25/2019   TRIG 52 10/25/2019   CHOLHDL 2.0 10/25/2019   Lab Results  Component Value Date   TSH 1.790 10/25/2019   No results found for: HGBA1C Lab Results  Component Value Date   WBC 8.0 10/25/2019   HGB 12.1 10/25/2019   HCT 38.4 10/25/2019   MCV 82 10/25/2019   PLT 273 10/25/2019   Lab Results  Component Value Date   ALT 6 10/25/2019   AST 10 10/25/2019   ALKPHOS 79 10/25/2019   BILITOT 0.5 10/25/2019     Review of Systems  Constitutional: Negative for appetite change, chills, fatigue, fever and unexpected weight change.  Respiratory: Negative for chest tightness and shortness of breath.   Cardiovascular: Negative for chest pain and palpitations.  Gastrointestinal: Positive for nausea. Negative for abdominal pain.  Psychiatric/Behavioral: Positive for depression, dysphoric mood and sleep disturbance. Negative for suicidal ideas. The patient has insomnia. The patient is not nervous/anxious.     Patient Active Problem List   Diagnosis Date Noted  . Gastroesophageal reflux disease without  esophagitis 05/14/2020  . Mixed anxiety and depressive disorder 08/24/2019  . 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  . Chronic lumbar radiculopathy 10/15/2017  . Arthritis of knee 10/15/2017  . Primary osteoarthritis of left hip 10/15/2017  . Mixed hyperlipidemia 06/11/2016  . Blind right eye 06/11/2016  . History of Clostridium difficile colitis 06/11/2016  . History of positive PPD 06/11/2016  . Detached retina 05/18/2016  . Pseudoaphakia 05/18/2016  . 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 Ocular hypertension 05/18/2016  . Cataract 05/18/2016  . 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 Essential hypertension 09/27/2014    No Known Allergies  Past Surgical History:  Procedure Laterality Date  . ABDOMINAL HYSTERECTOMY    . COLONOSCOPY  ~2006   normal  . RETINAL DETACHMENT SURGERY Right     Social History   Tobacco Use  . Smoking status: Former Smoker    Types: Cigarettes    Quit date: 12/30/1983    Years since quitting: 36.4  . Smokeless tobacco: Never Used  Vaping Use  . Vaping Use: Never used  Substance Use Topics  . Alcohol use: No    Alcohol/week: 0.0 standard drinks  . Drug use: Never     Medication list has been reviewed and updated.  Current Meds  Medication Sig  . amLODipine (NORVASC) 5 MG tablet Take 1 tablet (5 mg total) by mouth daily.  Marland Kitchen  aspirin 81 MG tablet Take 81 mg by mouth daily.  Marland Kitchen atropine 1 % ophthalmic solution PLACE 1 DROP INTO THE RIGHT EYE ONCE DAILY  . famotidine (PEPCID) 20 MG tablet Take 1 tablet (20 mg total) by mouth 2 (two) times daily. (Patient taking differently: Take 20 mg by mouth 2 (two) times daily. PRN)  . prednisoLONE acetate (PRED FORTE) 1 % ophthalmic suspension   . simvastatin (ZOCOR) 10 MG tablet TAKE 1 TABLET BY MOUTH EVERYDAY AT BEDTIME    PHQ 2/9 Scores 06/14/2020 05/14/2020 10/25/2019 08/24/2019  PHQ - 2 Score 2 5 0 2  PHQ- 9 Score 5 9 2 4     GAD 7 : Generalized Anxiety Score 06/14/2020  10/25/2019 08/24/2019 09/16/2016  Nervous, Anxious, on Edge 0 0 3 2  Control/stop worrying 1 0 0 3  Worry too much - different things 1 0 3 3  Trouble relaxing 0 0 3 3  Restless 0 0 0 0  Easily annoyed or irritable 0 0 1 0  Afraid - awful might happen 0 0 0 0  Total GAD 7 Score 2 0 10 11  Anxiety Difficulty Not difficult at all Not difficult at all Extremely difficult Somewhat difficult    BP Readings from Last 3 Encounters:  06/14/20 118/72  05/14/20 132/78  05/14/20 132/78    Physical Exam Vitals and nursing note reviewed.  Constitutional:      General: She is irritable. She is not in acute distress.    Appearance: She is well-developed.  HENT:     Head: Normocephalic and atraumatic.  Cardiovascular:     Rate and Rhythm: Normal rate and regular rhythm.  Pulmonary:     Effort: Pulmonary effort is normal. No respiratory distress.     Breath sounds: No wheezing or rhonchi.  Musculoskeletal:        General: Normal range of motion.     Cervical back: Normal range of motion.  Skin:    General: Skin is warm and dry.     Findings: No rash.  Neurological:     Mental Status: She is alert and oriented to person, place, and time.  Psychiatric:        Attention and Perception: Attention normal.        Mood and Affect: Mood is depressed.        Speech: Speech normal.        Behavior: Behavior normal.        Thought Content: Thought content normal. Thought content does not include suicidal ideation. Thought content does not include suicidal plan.     Wt Readings from Last 3 Encounters:  06/14/20 165 lb (74.8 kg)  05/14/20 165 lb 6.4 oz (75 kg)  05/14/20 165 lb 6.4 oz (75 kg)    BP 118/72   Pulse 69   Temp 98.3 F (36.8 C) (Oral)   Ht 5\' 5"  (1.651 m)   Wt 165 lb (74.8 kg)   SpO2 96%   BMI 27.46 kg/m   Assessment and Plan: 1. Grief reaction Due to her husbands death and adjustment to living alone She has good support from friends and family Does not appear to need  medication for depression at this time but she can follow up if needed  2. Adjustment insomnia Will try low dose clonazepam at bedtime - for one week then as needed Cautioned about sedation Can also use 1/2 tablet during the day for anxiety if needed - clonazePAM (KLONOPIN) 0.5 MG tablet; Take 1 tablet (  0.5 mg total) by mouth at bedtime.  Dispense: 30 tablet; Refill: 0  Partially dictated using Editor, commissioning. Any errors are unintentional.  Halina Maidens, MD [facility]  06/14/2020

## 2020-06-14 NOTE — Patient Instructions (Signed)
Take clonazepam at bedtime for the first week. If you feel that your sleep is improved, you can take the clonazepam only as needed. If you are very anxious during the day and are not driving, you can also take 1/2 of a clonazepam as needed.

## 2020-10-01 ENCOUNTER — Other Ambulatory Visit: Payer: Self-pay | Admitting: Internal Medicine

## 2020-10-01 DIAGNOSIS — Z1231 Encounter for screening mammogram for malignant neoplasm of breast: Secondary | ICD-10-CM

## 2020-10-11 ENCOUNTER — Ambulatory Visit
Admission: RE | Admit: 2020-10-11 | Discharge: 2020-10-11 | Disposition: A | Discharge status: Home / Self Care | Payer: Medicare Other | Source: Ambulatory Visit | Attending: Internal Medicine | Admitting: Internal Medicine

## 2020-10-11 ENCOUNTER — Ambulatory Visit (INDEPENDENT_AMBULATORY_CARE_PROVIDER_SITE_OTHER): Payer: Medicare Other

## 2020-10-11 ENCOUNTER — Other Ambulatory Visit: Payer: Self-pay

## 2020-10-11 DIAGNOSIS — Z1231 Encounter for screening mammogram for malignant neoplasm of breast: Secondary | ICD-10-CM | POA: Diagnosis not present

## 2020-10-11 DIAGNOSIS — Z23 Encounter for immunization: Secondary | ICD-10-CM | POA: Diagnosis not present

## 2020-10-18 DIAGNOSIS — Z23 Encounter for immunization: Secondary | ICD-10-CM | POA: Diagnosis not present

## 2020-10-29 ENCOUNTER — Other Ambulatory Visit: Payer: Self-pay

## 2020-10-29 ENCOUNTER — Encounter: Payer: Self-pay | Admitting: Internal Medicine

## 2020-10-29 ENCOUNTER — Ambulatory Visit (INDEPENDENT_AMBULATORY_CARE_PROVIDER_SITE_OTHER): Payer: Medicare Other | Admitting: Internal Medicine

## 2020-10-29 VITALS — BP 130/78 | HR 70 | Temp 98.2°F | Ht 65.0 in | Wt 158.0 lb

## 2020-10-29 DIAGNOSIS — I10 Essential (primary) hypertension: Secondary | ICD-10-CM

## 2020-10-29 DIAGNOSIS — K219 Gastro-esophageal reflux disease without esophagitis: Secondary | ICD-10-CM

## 2020-10-29 DIAGNOSIS — F418 Other specified anxiety disorders: Secondary | ICD-10-CM

## 2020-10-29 DIAGNOSIS — E782 Mixed hyperlipidemia: Secondary | ICD-10-CM | POA: Diagnosis not present

## 2020-10-29 LAB — POCT URINALYSIS DIPSTICK
Bilirubin, UA: NEGATIVE
Blood, UA: NEGATIVE
Glucose, UA: NEGATIVE
Ketones, UA: NEGATIVE
Leukocytes, UA: NEGATIVE
Nitrite, UA: NEGATIVE
Protein, UA: POSITIVE — AB
Spec Grav, UA: 1.015 (ref 1.010–1.025)
Urobilinogen, UA: 0.2 E.U./dL
pH, UA: 5 (ref 5.0–8.0)

## 2020-10-29 MED ORDER — SIMVASTATIN 10 MG PO TABS: 10.0000 mg | ORAL_TABLET | Freq: Every day | ORAL | 1 refills | Status: DC

## 2020-10-29 NOTE — Progress Notes (Signed)
Date:  10/29/2020   Name:  Kristy Sellers   DOB:  [birth year]   MRN:  [id number]   Chief Complaint: Annual Exam (breast exam no pap)  Kristy Sellers is a 79 y.o. female who presents today for her Complete Annual Exam. She feels well. She reports exercising walks 3 miles x5 days a week. She reports she is sleeping poorly. Breast complaints none. Her husband is going to be buried in [facility] in 2 weeks.  Mammogram: 09/2020 DEXA: 2016 Pap smear: discontinued Colonoscopy: Cologuard 10/2016  Immunization History  Administered Date(s) Administered  . Fluad Quad(high Dose 65+) 09/21/2019, 10/11/2020  . Influenza, High Dose Seasonal PF 09/17/2018  . Influenza,inj,Quad PF,6+ Mos 10/14/2016, 10/15/2017  . PFIZER SARS-COV-2 Vaccination 12/31/2019, 01/21/2020, 10/18/2020  . Pneumococcal Conjugate-13 10/15/2017  . Pneumococcal-Unspecified 12/29/2013  . Tdap 07/10/2014  . Zoster 09/28/2013    Hypertension This is a chronic problem. The problem is controlled. Pertinent negatives include no chest pain, headaches, palpitations or shortness of breath. Past treatments include calcium channel blockers. The current treatment provides significant improvement.  Hyperlipidemia The problem is controlled. Pertinent negatives include no chest pain or shortness of breath. Current antihyperlipidemic treatment includes statins. The current treatment provides significant improvement of lipids.  Gastroesophageal Reflux She reports no abdominal pain, no chest pain, no coughing or no wheezing. This is a recurrent problem. The problem occurs rarely. Pertinent negatives include no fatigue. She has tried a histamine-2 antagonist for the symptoms. The treatment provided significant relief.  Insomnia Primary symptoms: no sleep disturbance.  The problem occurs nightly. The problem is unchanged. The symptoms are relieved by medication (low dose clonazepam).    Lab Results  Component Value Date    CREATININE 0.81 10/25/2019   BUN 14 10/25/2019   NA 143 10/25/2019   K 3.9 10/25/2019   CL 106 10/25/2019   CO2 24 10/25/2019   Lab Results  Component Value Date   CHOL 143 10/25/2019   HDL 70 10/25/2019   LDLCALC 62 10/25/2019   TRIG 52 10/25/2019   CHOLHDL 2.0 10/25/2019   Lab Results  Component Value Date   TSH 1.790 10/25/2019   No results found for: HGBA1C Lab Results  Component Value Date   WBC 8.0 10/25/2019   HGB 12.1 10/25/2019   HCT 38.4 10/25/2019   MCV 82 10/25/2019   PLT 273 10/25/2019   Lab Results  Component Value Date   ALT 6 10/25/2019   AST 10 10/25/2019   ALKPHOS 79 10/25/2019   BILITOT 0.5 10/25/2019     Review of Systems  Constitutional: Negative for chills, fatigue and fever.  HENT: Negative for congestion, hearing loss, tinnitus, trouble swallowing and voice change.   Eyes: Negative for visual disturbance.  Respiratory: Negative for cough, chest tightness, shortness of breath and wheezing.   Cardiovascular: Negative for chest pain, palpitations and leg swelling.  Gastrointestinal: Negative for abdominal pain, constipation, diarrhea and vomiting.  Endocrine: Negative for polydipsia and polyuria.  Genitourinary: Negative for dysuria, frequency, genital sores, vaginal bleeding and vaginal discharge.  Musculoskeletal: Negative for arthralgias, gait problem and joint swelling.  Skin: Negative for color change and rash.  Neurological: Negative for dizziness, tremors, light-headedness and headaches.  Hematological: Negative for adenopathy. Does not bruise/bleed easily.  Psychiatric/Behavioral: Negative for dysphoric mood and sleep disturbance. The patient is nervous/anxious (unchanged) and has insomnia.     Patient Active Problem List   Diagnosis Date Noted  . Gastroesophageal reflux disease without esophagitis 05/14/2020  .  Mixed anxiety and depressive disorder 08/24/2019  . 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  . Chronic lumbar  radiculopathy 10/15/2017  . Arthritis of knee 10/15/2017  . Primary osteoarthritis of left hip 10/15/2017  . Mixed hyperlipidemia 06/11/2016  . Blind right eye 06/11/2016  . History of Clostridium difficile colitis 06/11/2016  . History of positive PPD 06/11/2016  . Detached retina 05/18/2016  . Pseudoaphakia 05/18/2016  . 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 Ocular hypertension 05/18/2016  . Cataract 05/18/2016  . 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 Essential hypertension 09/27/2014    No Known Allergies  Past Surgical History:  Procedure Laterality Date  . ABDOMINAL HYSTERECTOMY    . COLONOSCOPY  ~2006   normal  . RETINAL DETACHMENT SURGERY Right     Social History   Tobacco Use  . Smoking status: Former Smoker    Types: Cigarettes    Quit date: 12/30/1983    Years since quitting: 36.8  . Smokeless tobacco: Never Used  Vaping Use  . Vaping Use: Never used  Substance Use Topics  . Alcohol use: No    Alcohol/week: 0.0 standard drinks  . Drug use: Never     Medication list has been reviewed and updated.  Current Meds  Medication Sig  . amLODipine (NORVASC) 5 MG tablet Take 1 tablet (5 mg total) by mouth daily.  Marland Kitchen aspirin 81 MG tablet Take 81 mg by mouth daily.  Marland Kitchen atropine 1 % ophthalmic solution PLACE 1 DROP INTO THE RIGHT EYE ONCE DAILY  . clonazePAM (KLONOPIN) 0.5 MG tablet Take 1 tablet (0.5 mg total) by mouth at bedtime.  . prednisoLONE acetate (PRED FORTE) 1 % ophthalmic suspension   . simvastatin (ZOCOR) 10 MG tablet Take 1 tablet (10 mg total) by mouth daily.  . [DISCONTINUED] simvastatin (ZOCOR) 10 MG tablet TAKE 1 TABLET BY MOUTH EVERYDAY AT BEDTIME    PHQ 2/9 Scores 10/29/2020 06/14/2020 05/14/2020 10/25/2019  PHQ - 2 Score 2 2 5  0  PHQ- 9 Score 5 5 9 2     GAD 7 : Generalized Anxiety Score 10/29/2020 06/14/2020 10/25/2019 08/24/2019  Nervous, Anxious, on Edge 2 0 0 3  Control/stop worrying 2 1 0 0  Worry too much - different  things 2 1 0 3  Trouble relaxing 0 0 0 3  Restless 0 0 0 0  Easily annoyed or irritable 0 0 0 1  Afraid - awful might happen 0 0 0 0  Total GAD 7 Score 6 2 0 10  Anxiety Difficulty Not difficult at all Not difficult at all Not difficult at all Extremely difficult    BP Readings from Last 3 Encounters:  10/29/20 130/78  06/14/20 118/72  05/14/20 132/78    Physical Exam Vitals and nursing note reviewed.  Constitutional:      General: She is not in acute distress.    Appearance: She is well-developed.  HENT:     Head: Normocephalic and atraumatic.     Right Ear: Tympanic membrane and ear canal normal.     Left Ear: Tympanic membrane and ear canal normal.     Nose:     Right Sinus: No maxillary sinus tenderness.     Left Sinus: No maxillary sinus tenderness.  Eyes:     General: No scleral icterus.       Right eye: No discharge.        Left eye: No discharge.     Conjunctiva/sclera: Conjunctivae normal.  Neck:  Thyroid: No thyromegaly.     Vascular: No carotid bruit.  Cardiovascular:     Rate and Rhythm: Normal rate and regular rhythm.     Pulses: Normal pulses.     Heart sounds: Normal heart sounds.  Pulmonary:     Effort: Pulmonary effort is normal. No respiratory distress.     Breath sounds: No wheezing.  Chest:     Breasts:        Right: No mass, nipple discharge, skin change or tenderness.        Left: No mass, nipple discharge, skin change or tenderness.  Abdominal:     General: Bowel sounds are normal.     Palpations: Abdomen is soft.     Tenderness: There is no abdominal tenderness.  Musculoskeletal:     Cervical back: Normal range of motion. No erythema.     Right lower leg: No edema.     Left lower leg: No edema.  Lymphadenopathy:     Cervical: No cervical adenopathy.  Skin:    General: Skin is warm and dry.     Capillary Refill: Capillary refill takes less than 2 seconds.     Findings: No rash.  Neurological:     General: No focal deficit present.      Mental Status: She is alert and oriented to person, place, and time.     Cranial Nerves: No cranial nerve deficit.     Sensory: No sensory deficit.     Deep Tendon Reflexes: Reflexes are normal and symmetric.  Psychiatric:        Attention and Perception: Attention normal.        Mood and Affect: Mood normal.     Wt Readings from Last 3 Encounters:  10/29/20 158 lb (71.7 kg)  06/14/20 165 lb (74.8 kg)  05/14/20 165 lb 6.4 oz (75 kg)    BP 130/78   Pulse 70   Temp 98.2 F (36.8 C) (Oral)   Ht 5\' 5"  (1.651 m)   Wt 158 lb (71.7 kg)   SpO2 97%   BMI 26.29 kg/m   Assessment and Plan: 1. Essential hypertension Clinically stable exam with well controlled BP. Tolerating medications without side effects at this time. Pt to continue current regimen and low sodium diet; benefits of regular exercise as able discussed. - CBC with Differential/Platelet - Comprehensive metabolic panel - POCT urinalysis dipstick  2. Gastroesophageal reflux disease without esophagitis No red flag signs such as weight loss, n/v, melena - CBC with Differential/Platelet  3. Mixed hyperlipidemia Tolerating statin medication without side effects at this time - Lipid panel - simvastatin (ZOCOR) 10 MG tablet; Take 1 tablet (10 mg total) by mouth daily.  Dispense: 90 tablet; Refill: 1  4. Mixed anxiety and depressive disorder Stable per patient - she has supportive family locally Sleep is disrupted but she is no longer taking clonazepam Recommend trial of Melatonin at bedtime - TSH   Partially dictated using Editor, commissioning. Any errors are unintentional.  Halina Maidens, MD [facility]  10/29/2020

## 2020-10-30 LAB — LIPID PANEL
Chol/HDL Ratio: 2.4 ratio (ref 0.0–4.4)
Cholesterol, Total: 165 mg/dL (ref 100–199)
HDL: 69 mg/dL (ref >39)
LDL Chol Calc (NIH): 81 mg/dL (ref 0–99)
Triglycerides: 83 mg/dL (ref 0–149)
VLDL Cholesterol Cal: 15 mg/dL (ref 5–40)

## 2020-10-30 LAB — COMPREHENSIVE METABOLIC PANEL
ALT: 8 IU/L (ref 0–32)
AST: 16 IU/L (ref 0–40)
Albumin/Globulin Ratio: 1.5 (ref 1.2–2.2)
Albumin: 4.4 g/dL (ref 3.7–4.7)
Alkaline Phosphatase: 86 IU/L (ref 44–121)
BUN/Creatinine Ratio: 19 (ref 12–28)
BUN: 16 mg/dL (ref 8–27)
Bilirubin Total: 0.5 mg/dL (ref 0.0–1.2)
CO2: 27 mmol/L (ref 20–29)
Calcium: 9.4 mg/dL (ref 8.7–10.3)
Chloride: 105 mmol/L (ref 96–106)
Creatinine, Ser: 0.85 mg/dL (ref 0.57–1.00)
GFR calc Af Amer: 75 mL/min/{1.73_m2} (ref >59)
GFR calc non Af Amer: 65 mL/min/{1.73_m2} (ref >59)
Globulin, Total: 2.9 g/dL (ref 1.5–4.5)
Glucose: 95 mg/dL (ref 65–99)
Potassium: 3.7 mmol/L (ref 3.5–5.2)
Sodium: 145 mmol/L — ABNORMAL HIGH (ref 134–144)
Total Protein: 7.3 g/dL (ref 6.0–8.5)

## 2020-10-30 LAB — CBC WITH DIFFERENTIAL/PLATELET
Basophils Absolute: 0 10*3/uL (ref 0.0–0.2)
Basos: 1 %
EOS (ABSOLUTE): 0.1 10*3/uL (ref 0.0–0.4)
Eos: 1 %
Hematocrit: 41.5 % (ref 34.0–46.6)
Hemoglobin: 13 g/dL (ref 11.1–15.9)
Immature Grans (Abs): 0 10*3/uL (ref 0.0–0.1)
Immature Granulocytes: 0 %
Lymphocytes Absolute: 1.6 10*3/uL (ref 0.7–3.1)
Lymphs: 19 %
MCH: 25.8 pg — ABNORMAL LOW (ref 26.6–33.0)
MCHC: 31.3 g/dL — ABNORMAL LOW (ref 31.5–35.7)
MCV: 82 fL (ref 79–97)
Monocytes Absolute: 0.4 10*3/uL (ref 0.1–0.9)
Monocytes: 5 %
Neutrophils Absolute: 6 10*3/uL (ref 1.4–7.0)
Neutrophils: 74 %
Platelets: 303 10*3/uL (ref 150–450)
RBC: 5.04 x10E6/uL (ref 3.77–5.28)
RDW: 15 % (ref 11.7–15.4)
WBC: 8.1 10*3/uL (ref 3.4–10.8)

## 2020-10-30 LAB — TSH: TSH: 1.93 u[IU]/mL (ref 0.450–4.500)

## 2020-11-07 DIAGNOSIS — H18421 Band keratopathy, right eye: Secondary | ICD-10-CM | POA: Diagnosis not present

## 2020-11-07 DIAGNOSIS — H3321 Serous retinal detachment, right eye: Secondary | ICD-10-CM | POA: Diagnosis not present

## 2020-11-07 DIAGNOSIS — H34832 Tributary (branch) retinal vein occlusion, left eye, with macular edema: Secondary | ICD-10-CM | POA: Diagnosis not present

## 2020-11-07 DIAGNOSIS — H209 Unspecified iridocyclitis: Secondary | ICD-10-CM | POA: Diagnosis not present

## 2020-11-07 DIAGNOSIS — H44112 Panuveitis, left eye: Secondary | ICD-10-CM | POA: Diagnosis not present

## 2021-03-07 ENCOUNTER — Ambulatory Visit: Payer: Self-pay | Admitting: *Deleted

## 2021-03-07 NOTE — Telephone Encounter (Signed)
Pt scheduled for 03/12/21

## 2021-03-07 NOTE — Telephone Encounter (Signed)
Please call pt to schedule an appt for dizziness.  KP

## 2021-03-07 NOTE — Telephone Encounter (Signed)
Pt called with complaints of an episode of wooziness on 02/27/21 and and intermittent dizziness since then; th ept says she has been hydrating and eating fruits and vegetables; the pt says a problem is she does not sleep; she is concerned about feeling lightheaded and feels it could be related to her not sleeping; recommendations made per nurse triage protoco; decision tree completed; she sees Dr Army Melia at [facility] and there is no availalbility within the suggested time frame; she would like for the office to contact her regarding scheduling; the pt can be contacted at [phone]; will route to office for scheduling. Reason for Disposition . [1] MODERATE dizziness (e.g., interferes with normal activities) AND [2] has NOT been evaluated by physician for this  (Exception: dizziness caused by heat exposure, sudden standing, or poor fluid intake)  Answer Assessment - Initial Assessment Questions 1. DESCRIPTION: "Describe your dizziness."    "'WOOZY" on Friday 2. LIGHTHEADED: "Do you feel lightheaded?" (e.g., somewhat faint, woozy, weak upon standing)     T, W, Th with actiivty 3. VERTIGO: "Do you feel like either you or the room is spinning or tilting?" (i.e. vertigo)    no 4. SEVERITY: "How bad is it?"  "Do you feel like you are going to faint?" "Can you stand and walk?"   - MILD: Feels slightly dizzy, but walking normally.   - MODERATE: Feels very unsteady when walking, but not falling; interferes with normal activities (e.g., school, work) .   - SEVERE: Unable to walk without falling, or requires assistance to walk without falling; feels like passing out now.      mild 5. ONSET:  "When did the dizziness begin?"     02/28/21 6. AGGRAVATING FACTORS: "Does anything make it worse?" (e.g., standing, change in head position)     Activity makes worse intermittently 7. HEART RATE: "Can you tell me your heart rate?" "How many beats in 15 seconds?"  (Note: not all patients can do this)       8.  CAUSE: "What do you think is causing the dizziness?"     "Not getting enough sleep" 9. RECURRENT SYMPTOM: "Have you had dizziness before?" If Yes, ask: "When was the last time?" "What happened that time?"     Yes 18 years ago when lifting weights in the gym 10. OTHER SYMPTOMS: "Do you have any other symptoms?" (e.g., fever, chest pain, vomiting, diarrhea, bleeding)      no 11. PREGNANCY: "Is there any chance you are pregnant?" "When was your last menstrual period?"       no  Protocols used: DIZZINESS [facility]

## 2021-03-12 ENCOUNTER — Ambulatory Visit (INDEPENDENT_AMBULATORY_CARE_PROVIDER_SITE_OTHER): Payer: Medicare Other | Admitting: Internal Medicine

## 2021-03-12 ENCOUNTER — Encounter: Payer: Self-pay | Admitting: Internal Medicine

## 2021-03-12 ENCOUNTER — Other Ambulatory Visit: Payer: Self-pay

## 2021-03-12 VITALS — BP 140/80 | HR 76 | Temp 97.9°F | Ht 65.0 in | Wt 163.0 lb

## 2021-03-12 DIAGNOSIS — R42 Dizziness and giddiness: Secondary | ICD-10-CM

## 2021-03-12 DIAGNOSIS — F39 Unspecified mood [affective] disorder: Secondary | ICD-10-CM

## 2021-03-12 DIAGNOSIS — I10 Essential (primary) hypertension: Secondary | ICD-10-CM | POA: Diagnosis not present

## 2021-03-12 HISTORY — DX: Unspecified mood (affective) disorder: F39

## 2021-03-12 NOTE — Progress Notes (Signed)
Date:  03/12/2021   Name:  Kristy Sellers   DOB:  05-19-1941   MRN:  [id number]   Chief Complaint: Dizziness (X1 week ago, happened a few times, started in car after exercising, pt stated she did not eat anything before it happened the first time, after pt had something to eat the feeling went away, pt still doesn't feel 100%)  Dizziness This is a new problem. The current episode started in the past 7 days. Episode frequency: one episode. The problem has been resolved (occured one day last week - she did not eat or drink before exercising AND she walked an extra 1.25 miles). Pertinent negatives include no abdominal pain, arthralgias, diaphoresis, headaches, nausea or vomiting.  Hypertension This is a chronic problem. The problem is unchanged. The problem is controlled. Pertinent negatives include no headaches. Past treatments include calcium channel blockers.    Lab Results  Component Value Date   CREATININE 0.85 10/29/2020   BUN 16 10/29/2020   NA 145 (H) 10/29/2020   K 3.7 10/29/2020   CL 105 10/29/2020   CO2 27 10/29/2020   Lab Results  Component Value Date   CHOL 165 10/29/2020   HDL 69 10/29/2020   LDLCALC 81 10/29/2020   TRIG 83 10/29/2020   CHOLHDL 2.4 10/29/2020   Lab Results  Component Value Date   TSH 1.930 10/29/2020   No results found for: HGBA1C Lab Results  Component Value Date   WBC 8.1 10/29/2020   HGB 13.0 10/29/2020   HCT 41.5 10/29/2020   MCV 82 10/29/2020   PLT 303 10/29/2020   Lab Results  Component Value Date   ALT 8 10/29/2020   AST 16 10/29/2020   ALKPHOS 86 10/29/2020   BILITOT 0.5 10/29/2020     Review of Systems  Constitutional: Negative for diaphoresis.  Gastrointestinal: Negative for abdominal pain, nausea and vomiting.  Musculoskeletal: Negative for arthralgias.  Neurological: Positive for dizziness. Negative for headaches.    Patient Active Problem List   Diagnosis Date Noted  . Gastroesophageal reflux disease without  esophagitis 05/14/2020  . Mixed anxiety and depressive disorder 08/24/2019  . 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  . Chronic lumbar radiculopathy 10/15/2017  . Arthritis of knee 10/15/2017  . Primary osteoarthritis of left hip 10/15/2017  . Mixed hyperlipidemia 06/11/2016  . Blind right eye 06/11/2016  . History of Clostridium difficile colitis 06/11/2016  . History of positive PPD 06/11/2016  . Detached retina 05/18/2016  . Pseudoaphakia 05/18/2016  . 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 Ocular hypertension 05/18/2016  . Cataract 05/18/2016  . 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 Essential hypertension 09/27/2014    No Known Allergies  Past Surgical History:  Procedure Laterality Date  . ABDOMINAL HYSTERECTOMY    . COLONOSCOPY  ~2006   normal  . RETINAL DETACHMENT SURGERY Right     Social History   Tobacco Use  . Smoking status: Former Smoker    Types: Cigarettes    Quit date: 12/30/1983    Years since quitting: 37.2  . Smokeless tobacco: Never Used  Vaping Use  . Vaping Use: Never used  Substance Use Topics  . Alcohol use: No    Alcohol/week: 0.0 standard drinks  . Drug use: Never     Medication list has been reviewed and updated.  Current Meds  Medication Sig  . amLODipine (NORVASC) 5 MG tablet Take 1 tablet (5 mg total) by mouth daily.  Marland Kitchen aspirin 81 MG  tablet Take 81 mg by mouth daily.  Marland Kitchen atropine 1 % ophthalmic solution PLACE 1 DROP INTO THE RIGHT EYE ONCE DAILY  . prednisoLONE acetate (PRED FORTE) 1 % ophthalmic suspension   . simvastatin (ZOCOR) 10 MG tablet Take 1 tablet (10 mg total) by mouth daily.    PHQ 2/9 Scores 03/12/2021 10/29/2020 06/14/2020 05/14/2020  PHQ - 2 Score 0 2 2 5   PHQ- 9 Score 4 5 5 9     GAD 7 : Generalized Anxiety Score 03/12/2021 10/29/2020 06/14/2020 10/25/2019  Nervous, Anxious, on Edge 3 2 0 0  Control/stop worrying 3 2 1  0  Worry too much - different things 3 2 1  0  Trouble relaxing 0 0 0 0   Restless 0 0 0 0  Easily annoyed or irritable 0 0 0 0  Afraid - awful might happen 0 0 0 0  Total GAD 7 Score 9 6 2  0  Anxiety Difficulty - Not difficult at all Not difficult at all Not difficult at all    BP Readings from Last 3 Encounters:  03/12/21 (!) 142/78  10/29/20 130/78  06/14/20 118/72    Physical Exam Vitals and nursing note reviewed.  Constitutional:      General: She is not in acute distress.    Appearance: Normal appearance. She is well-developed.  HENT:     Head: Normocephalic and atraumatic.  Neck:     Vascular: No carotid bruit.  Cardiovascular:     Rate and Rhythm: Normal rate and regular rhythm.  No extrasystoles are present.    Pulses: Normal pulses.          Radial pulses are 2+ on the right side and 2+ on the left side.       Dorsalis pedis pulses are 2+ on the right side and 2+ on the left side.     Heart sounds: No murmur heard.   Pulmonary:     Effort: Pulmonary effort is normal. No respiratory distress.  Musculoskeletal:     Cervical back: Normal range of motion.     Right lower leg: No edema.     Left lower leg: No edema.  Lymphadenopathy:     Cervical: No cervical adenopathy.  Skin:    General: Skin is warm and dry.     Findings: No rash.  Neurological:     Mental Status: She is alert and oriented to person, place, and time.  Psychiatric:        Mood and Affect: Mood normal.        Behavior: Behavior normal.     Wt Readings from Last 3 Encounters:  03/12/21 163 lb (73.9 kg)  10/29/20 158 lb (71.7 kg)  06/14/20 165 lb (74.8 kg)    BP (!) 142/78   Pulse 76   Temp 97.9 F (36.6 C) (Oral)   Ht 5\' 5"  (1.651 m)   Wt 163 lb (73.9 kg)   SpO2 98%   BMI 27.12 kg/m   Assessment and Plan: 1. Essential hypertension BP borderline on amlodipine 5 mg daily Pt will monitor at home and record - bring cuff and readings to next appt - will call sooner if persistently elevated - EKG 12-Lead - SR @ 61; LAFB and small ant Q waves - all  present since 1610 - Basic metabolic panel  2. Dizziness of unknown etiology Likely due to lack of fluids and food as well as an increase in usual exercise time Recommend hydrating prior to exercise; consider eating a  small high protein snack prior as well. - EKG 12-Lead - unchanged  3. Mood disorder ([place]) Due to living alone since husband passed last year - more acute in the evenings  She has very supportive neighbors and does not want to move Monitor for now - call or return if needed   Partially dictated using Editor, commissioning. Any errors are unintentional.  Halina Maidens, MD [facility]  03/12/2021

## 2021-03-12 NOTE — Patient Instructions (Signed)
Omron or Sunbeam are good bp cuff brands.  Get an arm cuff not a wrist cuff.

## 2021-03-13 LAB — BASIC METABOLIC PANEL
BUN/Creatinine Ratio: 16 (ref 12–28)
BUN: 13 mg/dL (ref 8–27)
CO2: 26 mmol/L (ref 20–29)
Calcium: 9.1 mg/dL (ref 8.7–10.3)
Chloride: 103 mmol/L (ref 96–106)
Creatinine, Ser: 0.81 mg/dL (ref 0.57–1.00)
Glucose: 84 mg/dL (ref 65–99)
Potassium: 4 mmol/L (ref 3.5–5.2)
Sodium: 144 mmol/L (ref 134–144)
eGFR: 73 mL/min/{1.73_m2} (ref >59)

## 2021-04-03 DIAGNOSIS — H18421 Band keratopathy, right eye: Secondary | ICD-10-CM | POA: Diagnosis not present

## 2021-04-03 DIAGNOSIS — H3321 Serous retinal detachment, right eye: Secondary | ICD-10-CM | POA: Diagnosis not present

## 2021-04-03 DIAGNOSIS — H209 Unspecified iridocyclitis: Secondary | ICD-10-CM | POA: Diagnosis not present

## 2021-04-03 DIAGNOSIS — H34832 Tributary (branch) retinal vein occlusion, left eye, with macular edema: Secondary | ICD-10-CM | POA: Diagnosis not present

## 2021-04-03 DIAGNOSIS — H44112 Panuveitis, left eye: Secondary | ICD-10-CM | POA: Diagnosis not present

## 2021-04-30 ENCOUNTER — Ambulatory Visit: Payer: Medicare Other | Admitting: Internal Medicine

## 2021-05-10 ENCOUNTER — Other Ambulatory Visit: Payer: Self-pay

## 2021-05-10 ENCOUNTER — Ambulatory Visit (INDEPENDENT_AMBULATORY_CARE_PROVIDER_SITE_OTHER): Payer: Medicare Other | Admitting: Internal Medicine

## 2021-05-10 ENCOUNTER — Encounter: Payer: Self-pay | Admitting: Internal Medicine

## 2021-05-10 VITALS — BP 122/82 | HR 91 | Ht 65.0 in | Wt 161.2 lb

## 2021-05-10 DIAGNOSIS — I10 Essential (primary) hypertension: Secondary | ICD-10-CM

## 2021-05-10 MED ORDER — AMLODIPINE BESYLATE 5 MG PO TABS: 5.0000 mg | ORAL_TABLET | Freq: Every day | ORAL | 3 refills | Status: DC

## 2021-05-10 NOTE — Patient Instructions (Signed)
Check your once a week or so just to keep track of the trend

## 2021-05-10 NOTE — Progress Notes (Signed)
Date:  05/10/2021   Name:  Kristy Sellers   DOB:  June 13, 1941   MRN:  [id number]   Chief Complaint: Hypertension  Hypertension This is a chronic problem. The problem has been waxing and waning since onset. Pertinent negatives include no chest pain, headaches, palpitations or shortness of breath. Past treatments include calcium channel blockers. The current treatment provides significant improvement. There are no compliance problems.   She has been monitoring her BP at home and most readings are 132/72 or less.   Her home cuff is checked here today and correlates well.  Lab Results  Component Value Date   CREATININE 0.81 03/12/2021   BUN 13 03/12/2021   NA 144 03/12/2021   K 4.0 03/12/2021   CL 103 03/12/2021   CO2 26 03/12/2021   Lab Results  Component Value Date   CHOL 165 10/29/2020   HDL 69 10/29/2020   LDLCALC 81 10/29/2020   TRIG 83 10/29/2020   CHOLHDL 2.4 10/29/2020   Lab Results  Component Value Date   TSH 1.930 10/29/2020   No results found for: HGBA1C Lab Results  Component Value Date   WBC 8.1 10/29/2020   HGB 13.0 10/29/2020   HCT 41.5 10/29/2020   MCV 82 10/29/2020   PLT 303 10/29/2020   Lab Results  Component Value Date   ALT 8 10/29/2020   AST 16 10/29/2020   ALKPHOS 86 10/29/2020   BILITOT 0.5 10/29/2020     Review of Systems  Constitutional: Positive for chills (intermittent). Negative for fatigue and unexpected weight change.  HENT: Negative for nosebleeds.   Eyes: Negative for visual disturbance.  Respiratory: Negative for cough, chest tightness, shortness of breath and wheezing.   Cardiovascular: Negative for chest pain, palpitations and leg swelling.  Gastrointestinal: Negative for abdominal pain, constipation and diarrhea.  Musculoskeletal: Positive for arthralgias (knees). Negative for gait problem.  Neurological: Negative for dizziness, weakness, light-headedness and headaches.  Psychiatric/Behavioral: Positive for dysphoric mood  (since husband passed away) and sleep disturbance.    Patient Active Problem List   Diagnosis Date Noted  . Mood disorder ([place]) 03/12/2021  . Gastroesophageal reflux disease without esophagitis 05/14/2020  . Mixed anxiety and depressive disorder 08/24/2019  . 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  . Chronic lumbar radiculopathy 10/15/2017  . Arthritis of knee 10/15/2017  . Primary osteoarthritis of left hip 10/15/2017  . Mixed hyperlipidemia 06/11/2016  . Blind right eye 06/11/2016  . History of Clostridium difficile colitis 06/11/2016  . History of positive PPD 06/11/2016  . Detached retina 05/18/2016  . Pseudoaphakia 05/18/2016  . 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 Ocular hypertension 05/18/2016  . Cataract 05/18/2016  . 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 Essential hypertension 09/27/2014    No Known Allergies  Past Surgical History:  Procedure Laterality Date  . ABDOMINAL HYSTERECTOMY    . COLONOSCOPY  ~2006   normal  . RETINAL DETACHMENT SURGERY Right     Social History   Tobacco Use  . Smoking status: Former Smoker    Types: Cigarettes    Quit date: 12/30/1983    Years since quitting: 37.3  . Smokeless tobacco: Never Used  Vaping Use  . Vaping Use: Never used  Substance Use Topics  . Alcohol use: No    Alcohol/week: 0.0 standard drinks  . Drug use: Never     Medication list has been reviewed and updated.  Current Meds  Medication Sig  . aspirin 81 MG tablet  Take 81 mg by mouth daily.  Marland Kitchen atropine 1 % ophthalmic solution PLACE 1 DROP INTO THE RIGHT EYE ONCE DAILY  . prednisoLONE acetate (PRED FORTE) 1 % ophthalmic suspension   . simvastatin (ZOCOR) 10 MG tablet Take 1 tablet (10 mg total) by mouth daily.  . [DISCONTINUED] amLODipine (NORVASC) 5 MG tablet Take 1 tablet (5 mg total) by mouth daily.    PHQ 2/9 Scores 05/10/2021 03/12/2021 10/29/2020 06/14/2020  PHQ - 2 Score 0 0 2 2  PHQ- 9 Score 2 4 5 5     GAD 7 :  Generalized Anxiety Score 05/10/2021 03/12/2021 10/29/2020 06/14/2020  Nervous, Anxious, on Edge 0 3 2 0  Control/stop worrying 0 3 2 1   Worry too much - different things 0 3 2 1   Trouble relaxing 0 0 0 0  Restless 0 0 0 0  Easily annoyed or irritable 0 0 0 0  Afraid - awful might happen 0 0 0 0  Total GAD 7 Score 0 9 6 2   Anxiety Difficulty Not difficult at all - Not difficult at all Not difficult at all    BP Readings from Last 3 Encounters:  05/10/21 122/82  03/12/21 140/80  10/29/20 130/78    Physical Exam Vitals and nursing note reviewed.  Constitutional:      General: She is not in acute distress.    Appearance: She is well-developed.  HENT:     Head: Normocephalic and atraumatic.  Cardiovascular:     Rate and Rhythm: Normal rate and regular rhythm.  Pulmonary:     Effort: Pulmonary effort is normal. No respiratory distress.     Breath sounds: No wheezing or rhonchi.  Musculoskeletal:     Cervical back: Normal range of motion.     Right lower leg: No edema.     Left lower leg: No edema.  Lymphadenopathy:     Cervical: No cervical adenopathy.  Skin:    General: Skin is warm and dry.     Findings: No rash.  Neurological:     Mental Status: She is alert and oriented to person, place, and time.  Psychiatric:        Mood and Affect: Mood normal.        Behavior: Behavior normal.     Wt Readings from Last 3 Encounters:  05/10/21 161 lb 3.2 oz (73.1 kg)  03/12/21 163 lb (73.9 kg)  10/29/20 158 lb (71.7 kg)    BP 122/82   Pulse 91   Ht 5\' 5"  (1.651 m)   Wt 161 lb 3.2 oz (73.1 kg)   SpO2 97%   BMI 26.83 kg/m   Assessment and Plan: 1. Essential hypertension Clinically stable exam with well controlled BP on amlodipine. Tolerating medications without side effects at this time. Home cuff correlates well. Continue home monitoring ~ once a week. Pt to continue current regimen and low sodium diet with regular exercise as able. - amLODipine (NORVASC) 5 MG tablet;  Take 1 tablet (5 mg total) by mouth daily.  Dispense: 90 tablet; Refill: 3   Partially dictated using Editor, commissioning. Any errors are unintentional.  Halina Maidens, MD [facility]  05/10/2021

## 2021-05-15 ENCOUNTER — Telehealth: Payer: Self-pay | Admitting: Internal Medicine

## 2021-05-15 NOTE — Telephone Encounter (Signed)
Copied from [place] [id number]. Topic: Medicare AWV >> May 15, 2021 12:59 PM Cher Nakai R wrote: Reason for CRM:  No answer unable to leave a  message for patient to call back and schedule Medicare Annual Wellness Visit (AWV) in office.   If unable to come into the office for AWV,  please offer to do virtually or by telephone.  Last AWV:  05/14/2020  Please schedule at anytime with [facility] Health Advisor.  40 minute appointment  Any questions, please contact me at [phone]

## 2021-05-18 ENCOUNTER — Other Ambulatory Visit: Payer: Self-pay | Admitting: Internal Medicine

## 2021-05-18 DIAGNOSIS — E782 Mixed hyperlipidemia: Secondary | ICD-10-CM

## 2021-06-25 ENCOUNTER — Telehealth: Payer: Self-pay | Admitting: Internal Medicine

## 2021-06-25 NOTE — Telephone Encounter (Signed)
Copied from [place] [id number]. Topic: Medicare AWV >> Jun 25, 2021  3:16 PM Cher Nakai R wrote: Reason for CRM:  Left message for patient to call back and schedule Medicare Annual Wellness Visit (AWV) in office.   If unable to come into the office for AWV,  please offer to do virtually or by telephone.  No hx of AWV eligible for AWVI as of  05/14/2020  Please schedule at anytime with [facility] Advisor.      40 Minutes appointment   Any questions, please call me at [phone]

## 2021-06-28 DIAGNOSIS — H40023 Open angle with borderline findings, high risk, bilateral: Secondary | ICD-10-CM | POA: Diagnosis not present

## 2021-08-05 DIAGNOSIS — Z23 Encounter for immunization: Secondary | ICD-10-CM | POA: Diagnosis not present

## 2021-08-21 DIAGNOSIS — H18421 Band keratopathy, right eye: Secondary | ICD-10-CM | POA: Diagnosis not present

## 2021-08-21 DIAGNOSIS — H34832 Tributary (branch) retinal vein occlusion, left eye, with macular edema: Secondary | ICD-10-CM | POA: Diagnosis not present

## 2021-08-21 DIAGNOSIS — H209 Unspecified iridocyclitis: Secondary | ICD-10-CM | POA: Diagnosis not present

## 2021-08-21 DIAGNOSIS — H3321 Serous retinal detachment, right eye: Secondary | ICD-10-CM | POA: Diagnosis not present

## 2021-08-21 DIAGNOSIS — H44112 Panuveitis, left eye: Secondary | ICD-10-CM | POA: Diagnosis not present

## 2021-08-21 DIAGNOSIS — H40023 Open angle with borderline findings, high risk, bilateral: Secondary | ICD-10-CM | POA: Diagnosis not present

## 2021-08-28 ENCOUNTER — Other Ambulatory Visit: Payer: Self-pay | Admitting: Internal Medicine

## 2021-08-28 DIAGNOSIS — Z1231 Encounter for screening mammogram for malignant neoplasm of breast: Secondary | ICD-10-CM

## 2021-10-14 ENCOUNTER — Ambulatory Visit: Payer: Medicare Other

## 2021-10-15 ENCOUNTER — Other Ambulatory Visit: Payer: Self-pay

## 2021-10-15 ENCOUNTER — Ambulatory Visit
Admission: RE | Admit: 2021-10-15 | Discharge: 2021-10-15 | Disposition: A | Discharge status: Home / Self Care | Payer: Medicare Other | Source: Ambulatory Visit | Attending: Internal Medicine | Admitting: Internal Medicine

## 2021-10-15 DIAGNOSIS — Z1231 Encounter for screening mammogram for malignant neoplasm of breast: Secondary | ICD-10-CM | POA: Diagnosis not present

## 2021-10-31 ENCOUNTER — Ambulatory Visit (INDEPENDENT_AMBULATORY_CARE_PROVIDER_SITE_OTHER): Payer: Medicare Other | Admitting: Internal Medicine

## 2021-10-31 ENCOUNTER — Other Ambulatory Visit: Payer: Self-pay

## 2021-10-31 ENCOUNTER — Encounter: Payer: Self-pay | Admitting: Internal Medicine

## 2021-10-31 VITALS — BP 128/70 | HR 77 | Ht 65.0 in | Wt 159.0 lb

## 2021-10-31 DIAGNOSIS — D485 Neoplasm of uncertain behavior of skin: Secondary | ICD-10-CM

## 2021-10-31 DIAGNOSIS — Z23 Encounter for immunization: Secondary | ICD-10-CM

## 2021-10-31 DIAGNOSIS — Z1211 Encounter for screening for malignant neoplasm of colon: Secondary | ICD-10-CM

## 2021-10-31 DIAGNOSIS — E782 Mixed hyperlipidemia: Secondary | ICD-10-CM | POA: Diagnosis not present

## 2021-10-31 DIAGNOSIS — I10 Essential (primary) hypertension: Secondary | ICD-10-CM

## 2021-10-31 LAB — POCT URINALYSIS DIPSTICK
Bilirubin, UA: NEGATIVE
Blood, UA: NEGATIVE
Glucose, UA: NEGATIVE
Ketones, UA: NEGATIVE
Leukocytes, UA: NEGATIVE
Nitrite, UA: NEGATIVE
Protein, UA: NEGATIVE
Spec Grav, UA: 1.015 (ref 1.010–1.025)
Urobilinogen, UA: 0.2 E.U./dL
pH, UA: 7 (ref 5.0–8.0)

## 2021-10-31 MED ORDER — SIMVASTATIN 10 MG PO TABS: 10.0000 mg | ORAL_TABLET | Freq: Every day | ORAL | 3 refills | Status: DC

## 2021-10-31 NOTE — Patient Instructions (Signed)
[facility] in [place] [phone] 

## 2021-10-31 NOTE — Progress Notes (Signed)
Date:  10/31/2021   Name:  Kristy Sellers   DOB:  02/03/1941   MRN:  [id number]   Chief Complaint: Annual Exam (Breast Exam. Wants to have her legs checked. Has lump on left lower leg. Not painful. ) Kristy Sellers is a 80 y.o. female who presents today for her Complete Annual Exam. She feels well. She reports exercising every day. She reports she is sleeping poorly since her husband passed away. Breast complaints - none.  Mammogram: 09/2021 DEXA: 05/2015 Colonoscopy: Cologuard 10/2016  Immunization History  Administered Date(s) Administered   Fluad Quad(high Dose 65+) 09/21/2019, 10/11/2020, 10/31/2021   Influenza, High Dose Seasonal PF 09/17/2018   Influenza,inj,Quad PF,6+ Mos 10/14/2016, 10/15/2017   PFIZER(Purple Top)SARS-COV-2 Vaccination 12/31/2019, 01/21/2020, 10/18/2020   Pneumococcal Conjugate-13 10/15/2017   Pneumococcal Polysaccharide-23 01/12/2014   Tdap 07/10/2014   Zoster, Live 09/28/2013    Hypertension This is a chronic problem. The problem is controlled (at home approx 135/78). Pertinent negatives include no chest pain, headaches, palpitations or shortness of breath. Past treatments include calcium channel blockers. The current treatment provides significant improvement.  Hyperlipidemia This is a chronic problem. The problem is controlled. Pertinent negatives include no chest pain or shortness of breath. Current antihyperlipidemic treatment includes statins. The current treatment provides significant improvement of lipids.   Lab Results  Component Value Date   CREATININE 0.81 03/12/2021   BUN 13 03/12/2021   NA 144 03/12/2021   K 4.0 03/12/2021   CL 103 03/12/2021   CO2 26 03/12/2021   Lab Results  Component Value Date   CHOL 165 10/29/2020   HDL 69 10/29/2020   LDLCALC 81 10/29/2020   TRIG 83 10/29/2020   CHOLHDL 2.4 10/29/2020   Lab Results  Component Value Date   TSH 1.930 10/29/2020   No results found for: HGBA1C Lab Results  Component Value  Date   WBC 8.1 10/29/2020   HGB 13.0 10/29/2020   HCT 41.5 10/29/2020   MCV 82 10/29/2020   PLT 303 10/29/2020   Lab Results  Component Value Date   ALT 8 10/29/2020   AST 16 10/29/2020   ALKPHOS 86 10/29/2020   BILITOT 0.5 10/29/2020     Review of Systems  Constitutional:  Negative for chills, fatigue and fever.  HENT:  Negative for congestion, hearing loss, tinnitus, trouble swallowing and voice change.   Eyes:  Positive for visual disturbance (unchanged).  Respiratory:  Negative for cough, chest tightness, shortness of breath and wheezing.   Cardiovascular:  Negative for chest pain, palpitations and leg swelling.  Gastrointestinal:  Negative for abdominal pain, constipation, diarrhea and vomiting.  Endocrine: Negative for polydipsia and polyuria.  Genitourinary:  Negative for dysuria, frequency, genital sores, vaginal bleeding and vaginal discharge.  Musculoskeletal:  Negative for arthralgias, gait problem and joint swelling.  Skin:  Negative for color change and rash.       2 areas on lower legs   Neurological:  Negative for dizziness, tremors, light-headedness and headaches.  Hematological:  Negative for adenopathy. Does not bruise/bleed easily.  Psychiatric/Behavioral:  Negative for dysphoric mood and sleep disturbance (wakes early frequently). The patient is not nervous/anxious.    Patient Active Problem List   Diagnosis Date Noted   Mood disorder ([place]) 03/12/2021   Gastroesophageal reflux disease without esophagitis 05/14/2020   Mixed anxiety and depressive disorder 08/24/2019   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   Chronic lumbar radiculopathy 10/15/2017   Arthritis of knee 10/15/2017   Primary osteoarthritis of left  hip 10/15/2017   Mixed hyperlipidemia 06/11/2016   Blind right eye 06/11/2016   History of Clostridium difficile colitis 06/11/2016   History of positive PPD 06/11/2016   Detached retina 05/18/2016   Pseudoaphakia 05/18/2016   Panuveitis  05/18/2016   Ocular hypertension 05/18/2016   Cataract 05/18/2016   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   Secondary hypotony of eye 09/27/2014   Essential hypertension 09/27/2014    No Known Allergies  Past Surgical History:  Procedure Laterality Date   ABDOMINAL HYSTERECTOMY     COLONOSCOPY  ~2006   normal   RETINAL DETACHMENT SURGERY Right     Social History   Tobacco Use   Smoking status: Former    Types: Cigarettes    Quit date: 12/30/1983    Years since quitting: 37.8   Smokeless tobacco: Never  Vaping Use   Vaping Use: Never used  Substance Use Topics   Alcohol use: No    Alcohol/week: 0.0 standard drinks   Drug use: Never     Medication list has been reviewed and updated.  Current Meds  Medication Sig   amLODipine (NORVASC) 5 MG tablet Take 1 tablet (5 mg total) by mouth daily.   aspirin 81 MG tablet Take 81 mg by mouth daily.   atropine 1 % ophthalmic solution PLACE 1 DROP INTO THE RIGHT EYE ONCE DAILY   prednisoLONE acetate (PRED FORTE) 1 % ophthalmic suspension    simvastatin (ZOCOR) 10 MG tablet Take 1 tablet (10 mg total) by mouth daily.    PHQ 2/9 Scores 10/31/2021 05/10/2021 03/12/2021 10/29/2020  PHQ - 2 Score 0 0 0 2  PHQ- 9 Score 0 2 4 5     GAD 7 : Generalized Anxiety Score 10/31/2021 05/10/2021 03/12/2021 10/29/2020  Nervous, Anxious, on Edge 0 0 3 2  Control/stop worrying 0 0 3 2  Worry too much - different things 0 0 3 2  Trouble relaxing 0 0 0 0  Restless 0 0 0 0  Easily annoyed or irritable 0 0 0 0  Afraid - awful might happen 0 0 0 0  Total GAD 7 Score 0 0 9 6  Anxiety Difficulty Not difficult at all Not difficult at all - Not difficult at all    BP Readings from Last 3 Encounters:  10/31/21 128/70  05/10/21 122/82  03/12/21 140/80    Physical Exam Vitals and nursing note reviewed.  Constitutional:      General: She is not in acute distress.    Appearance: She is well-developed.  HENT:     Head: Normocephalic and atraumatic.     Right  Ear: Tympanic membrane and ear canal normal.     Left Ear: Tympanic membrane and ear canal normal.     Nose:     Right Sinus: No maxillary sinus tenderness.     Left Sinus: No maxillary sinus tenderness.  Neck:     Thyroid: No thyromegaly.     Vascular: No carotid bruit.  Cardiovascular:     Rate and Rhythm: Normal rate and regular rhythm.     Pulses: Normal pulses.     Heart sounds: Normal heart sounds.  Pulmonary:     Effort: Pulmonary effort is normal. No respiratory distress.     Breath sounds: No wheezing.  Chest:  Breasts:    Right: No mass, nipple discharge, skin change or tenderness.     Left: No mass, nipple discharge, skin change or tenderness.  Abdominal:     General: Bowel sounds are normal.  Palpations: Abdomen is soft.     Tenderness: There is no abdominal tenderness.  Musculoskeletal:     Cervical back: Normal range of motion. No erythema.     Right lower leg: No edema.     Left lower leg: No edema.  Lymphadenopathy:     Cervical: No cervical adenopathy.  Skin:    General: Skin is warm and dry.     Findings: No rash.     Comments: Lateral mid left calf - 1 cm thickened firm non tender area  Similar larger 2 cm lesion on medial mid right calf  Neurological:     Mental Status: She is alert and oriented to person, place, and time.     Cranial Nerves: No cranial nerve deficit.     Sensory: No sensory deficit.     Deep Tendon Reflexes: Reflexes are normal and symmetric.  Psychiatric:        Attention and Perception: Attention normal.        Mood and Affect: Mood normal.    Wt Readings from Last 3 Encounters:  10/31/21 159 lb (72.1 kg)  05/10/21 161 lb 3.2 oz (73.1 kg)  03/12/21 163 lb (73.9 kg)    BP 128/70   Pulse 77   Ht 5\' 5"  (1.651 m)   Wt 159 lb (72.1 kg)   SpO2 98%   BMI 26.46 kg/m   Assessment and Plan: 1. Essential hypertension Clinically stable exam with well controlled BP. Tolerating medications without side effects at this  time. Pt to continue current regimen and low sodium diet; benefits of regular exercise as able discussed. - CBC with Differential/Platelet - Comprehensive metabolic panel - TSH - POCT urinalysis dipstick  2. Mixed hyperlipidemia On appropriate statin therapy. - Lipid panel - simvastatin (ZOCOR) 10 MG tablet; Take 1 tablet (10 mg total) by mouth daily.  Dispense: 90 tablet; Refill: 3  3. Neoplasm of uncertain behavior of skin Recommend [facility]  4. Colon cancer screening - Cologuard   Partially dictated using Editor, commissioning. Any errors are unintentional.  Halina Maidens, MD [facility]  10/31/2021

## 2021-11-01 LAB — CBC WITH DIFFERENTIAL/PLATELET
Basophils Absolute: 0 10*3/uL (ref 0.0–0.2)
Basos: 0 %
EOS (ABSOLUTE): 0.1 10*3/uL (ref 0.0–0.4)
Eos: 1 %
Hematocrit: 39.9 % (ref 34.0–46.6)
Hemoglobin: 12.8 g/dL (ref 11.1–15.9)
Immature Grans (Abs): 0 10*3/uL (ref 0.0–0.1)
Immature Granulocytes: 0 %
Lymphocytes Absolute: 1.3 10*3/uL (ref 0.7–3.1)
Lymphs: 16 %
MCH: 25.2 pg — ABNORMAL LOW (ref 26.6–33.0)
MCHC: 32.1 g/dL (ref 31.5–35.7)
MCV: 79 fL (ref 79–97)
Monocytes Absolute: 0.5 10*3/uL (ref 0.1–0.9)
Monocytes: 7 %
Neutrophils Absolute: 6.3 10*3/uL (ref 1.4–7.0)
Neutrophils: 76 %
Platelets: 275 10*3/uL (ref 150–450)
RBC: 5.07 x10E6/uL (ref 3.77–5.28)
RDW: 14.4 % (ref 11.7–15.4)
WBC: 8.2 10*3/uL (ref 3.4–10.8)

## 2021-11-01 LAB — COMPREHENSIVE METABOLIC PANEL
ALT: 10 IU/L (ref 0–32)
AST: 16 IU/L (ref 0–40)
Albumin/Globulin Ratio: 1.5 (ref 1.2–2.2)
Albumin: 4.4 g/dL (ref 3.7–4.7)
Alkaline Phosphatase: 92 IU/L (ref 44–121)
BUN/Creatinine Ratio: 18 (ref 12–28)
BUN: 13 mg/dL (ref 8–27)
Bilirubin Total: 0.6 mg/dL (ref 0.0–1.2)
CO2: 26 mmol/L (ref 20–29)
Calcium: 9.2 mg/dL (ref 8.7–10.3)
Chloride: 104 mmol/L (ref 96–106)
Creatinine, Ser: 0.74 mg/dL (ref 0.57–1.00)
Globulin, Total: 3 g/dL (ref 1.5–4.5)
Glucose: 89 mg/dL (ref 70–99)
Potassium: 3.8 mmol/L (ref 3.5–5.2)
Sodium: 142 mmol/L (ref 134–144)
Total Protein: 7.4 g/dL (ref 6.0–8.5)
eGFR: 82 mL/min/{1.73_m2} (ref >59)

## 2021-11-01 LAB — LIPID PANEL
Chol/HDL Ratio: 2.5 ratio (ref 0.0–4.4)
Cholesterol, Total: 179 mg/dL (ref 100–199)
HDL: 73 mg/dL (ref >39)
LDL Chol Calc (NIH): 97 mg/dL (ref 0–99)
Triglycerides: 47 mg/dL (ref 0–149)
VLDL Cholesterol Cal: 9 mg/dL (ref 5–40)

## 2021-11-01 LAB — TSH: TSH: 2.2 u[IU]/mL (ref 0.450–4.500)

## 2021-11-06 DIAGNOSIS — Z23 Encounter for immunization: Secondary | ICD-10-CM | POA: Diagnosis not present

## 2021-11-11 DIAGNOSIS — Z1211 Encounter for screening for malignant neoplasm of colon: Secondary | ICD-10-CM | POA: Diagnosis not present

## 2021-11-15 LAB — COLOGUARD: COLOGUARD: NEGATIVE

## 2021-12-02 ENCOUNTER — Ambulatory Visit: Payer: Medicare Other

## 2022-03-05 ENCOUNTER — Other Ambulatory Visit: Payer: Self-pay | Admitting: Internal Medicine

## 2022-03-05 DIAGNOSIS — I10 Essential (primary) hypertension: Secondary | ICD-10-CM

## 2022-03-05 NOTE — Telephone Encounter (Signed)
Refilled 05/10/2021 #90 3 refills - 1 year supply ?Requested Prescriptions  ?Pending Prescriptions Disp Refills  ?? amLODipine (NORVASC) 5 MG tablet [Pharmacy Med Name: AMLODIPINE BESYLATE 5 MG TAB] 90 tablet 3  ?  Sig: TAKE 1 TABLET (5 MG TOTAL) BY MOUTH DAILY.  ?  ? Cardiovascular: Calcium Channel Blockers 2 Passed - 03/05/2022  9:04 AM  ?  ?  Passed - Last BP in normal range  ?  BP Readings from Last 1 Encounters:  ?10/31/21 128/70  ?   ?  ?  Passed - Last Heart Rate in normal range  ?  Pulse Readings from Last 1 Encounters:  ?10/31/21 77  ?   ?  ?  Passed - Valid encounter within last 6 months  ?  Recent Outpatient Visits   ?      ? 4 months ago Essential hypertension  ? [facility] Glean Hess, MD  ? 9 months ago Essential hypertension  ? [facility] Glean Hess, MD  ? 11 months ago Essential hypertension  ? [facility] Glean Hess, MD  ? 1 year ago Essential hypertension  ? [facility] Glean Hess, MD  ? 1 year ago Grief reaction  ? [facility] Glean Hess, MD  ?  ?  ?Future Appointments   ?        ? In 1 month Army Melia Jesse Sans, MD [facility], [place]  ? In 8 months Army Melia Jesse Sans, MD [facility], [place]  ?  ? ?  ?  ?  ? ?

## 2022-04-30 ENCOUNTER — Encounter: Payer: Self-pay | Admitting: Internal Medicine

## 2022-04-30 ENCOUNTER — Other Ambulatory Visit: Payer: Self-pay

## 2022-04-30 ENCOUNTER — Ambulatory Visit (INDEPENDENT_AMBULATORY_CARE_PROVIDER_SITE_OTHER): Payer: Medicare Other

## 2022-04-30 ENCOUNTER — Ambulatory Visit (INDEPENDENT_AMBULATORY_CARE_PROVIDER_SITE_OTHER): Payer: Medicare Other | Admitting: Internal Medicine

## 2022-04-30 VITALS — BP 122/68 | HR 81 | Ht 65.0 in | Wt 161.0 lb

## 2022-04-30 VITALS — BP 122/80 | HR 81 | Ht 65.0 in | Wt 161.0 lb

## 2022-04-30 DIAGNOSIS — M62838 Other muscle spasm: Secondary | ICD-10-CM | POA: Diagnosis not present

## 2022-04-30 DIAGNOSIS — I10 Essential (primary) hypertension: Secondary | ICD-10-CM

## 2022-04-30 DIAGNOSIS — Z0000 Encounter for general adult medical examination without abnormal findings: Secondary | ICD-10-CM | POA: Diagnosis not present

## 2022-04-30 MED ORDER — AMLODIPINE BESYLATE 5 MG PO TABS: 5.0000 mg | ORAL_TABLET | Freq: Every day | ORAL | 3 refills | Status: DC

## 2022-04-30 NOTE — Progress Notes (Signed)
? ? ?Date:  04/30/2022  ? ?Name:  Kristy Sellers   DOB:  [birth year]   MRN:  [id number] ? ? ?Chief Complaint: Hypertension ? ?Hypertension ?This is a chronic problem. The problem is controlled. Pertinent negatives include no chest pain, headaches, palpitations or shortness of breath. Past treatments include calcium channel blockers. There are no compliance problems.  There is no history of kidney disease, CAD/MI or CVA.  ? ?Lab Results  ?Component Value Date  ? NA 142 10/31/2021  ? K 3.8 10/31/2021  ? CO2 26 10/31/2021  ? GLUCOSE 89 10/31/2021  ? BUN 13 10/31/2021  ? CREATININE 0.74 10/31/2021  ? CALCIUM 9.2 10/31/2021  ? EGFR 82 10/31/2021  ? GFRNONAA 65 10/29/2020  ? ?Lab Results  ?Component Value Date  ? CHOL 179 10/31/2021  ? HDL 73 10/31/2021  ? [place] 97 10/31/2021  ? TRIG 47 10/31/2021  ? CHOLHDL 2.5 10/31/2021  ? ?Lab Results  ?Component Value Date  ? TSH 2.200 10/31/2021  ? ?No results found for: HGBA1C ?Lab Results  ?Component Value Date  ? WBC 8.2 10/31/2021  ? HGB 12.8 10/31/2021  ? HCT 39.9 10/31/2021  ? MCV 79 10/31/2021  ? PLT 275 10/31/2021  ? ?Lab Results  ?Component Value Date  ? ALT 10 10/31/2021  ? AST 16 10/31/2021  ? ALKPHOS 92 10/31/2021  ? BILITOT 0.6 10/31/2021  ? ?No results found for: 25OHVITD2, [place], VD25OH  ? ?Review of Systems  ?Constitutional:  Negative for fatigue and unexpected weight change.  ?HENT:  Negative for nosebleeds.   ?Eyes:  Negative for visual disturbance.  ?Respiratory:  Negative for cough, chest tightness, shortness of breath and wheezing.   ?Cardiovascular:  Negative for chest pain, palpitations and leg swelling.  ?Gastrointestinal:  Negative for abdominal pain, constipation and diarrhea.  ?Musculoskeletal:  Positive for arthralgias (neck strain after eye exam).  ?Neurological:  Negative for dizziness, weakness, light-headedness and headaches.  ?Psychiatric/Behavioral:  Negative for dysphoric mood and sleep disturbance. The patient is not nervous/anxious.   ?     She  is disappointed that she can not find activities to occupy her time - she enjoys working with seniors but [employer] closed here in [place] and she can not drive long distance due to vision impairment.  ? ?Patient Active Problem List  ? Diagnosis Date Noted  ? Gastroesophageal reflux disease without esophagitis 05/14/2020  ? 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  ? Chronic lumbar radiculopathy 10/15/2017  ? Arthritis of knee 10/15/2017  ? Primary osteoarthritis of left hip 10/15/2017  ? Mixed hyperlipidemia 06/11/2016  ? Blind right eye 06/11/2016  ? History of Clostridium difficile colitis 06/11/2016  ? History of positive PPD 06/11/2016  ? Detached retina 05/18/2016  ? Pseudoaphakia 05/18/2016  ? 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  ? Ocular hypertension 05/18/2016  ? Cataract 05/18/2016  ? 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  ? Secondary hypotony of eye 09/27/2014  ? Essential hypertension 09/27/2014  ? ? ?No Known Allergies ? ?Past Surgical History:  ?Procedure Laterality Date  ? ABDOMINAL HYSTERECTOMY    ? COLONOSCOPY  ~2006  ? normal  ? RETINAL DETACHMENT SURGERY Right   ? ? ?Social History  ? ?Tobacco Use  ? Smoking status: Former  ?  Types: Cigarettes  ?  Quit date: 12/30/1983  ?  Years since quitting: 38.3  ? Smokeless tobacco: Never  ?Vaping Use  ? Vaping Use: Never used  ?Substance Use Topics  ? Alcohol use: No  ?  Alcohol/week: 0.0 standard drinks  ?  Drug use: Never  ? ? ? ?Medication list has been reviewed and updated. ? ?Current Meds  ?Medication Sig  ? amLODipine (NORVASC) 5 MG tablet Take 1 tablet (5 mg total) by mouth daily.  ? aspirin 81 MG tablet Take 81 mg by mouth daily.  ? atropine 1 % ophthalmic solution PLACE 1 DROP INTO THE RIGHT EYE ONCE DAILY  ? prednisoLONE acetate (PRED FORTE) 1 % ophthalmic suspension   ? simvastatin (ZOCOR) 10 MG tablet Take 1 tablet (10 mg total) by mouth daily.  ? ? ? ?  04/30/2022  ?  8:56 AM 10/31/2021  ?  8:52 AM 05/10/2021  ?  8:51 AM 03/12/2021  ? 10:13 AM  ?GAD 7 : Generalized Anxiety  Score  ?Nervous, Anxious, on Edge 0 0 0 3  ?Control/stop worrying 0 0 0 3  ?Worry too much - different things 1 0 0 3  ?Trouble relaxing 0 0 0 0  ?Restless 0 0 0 0  ?Easily annoyed or irritable 0 0 0 0  ?Afraid - awful might happen 0 0 0 0  ?Total GAD 7 Score 1 0 0 9  ?Anxiety Difficulty  Not difficult at all Not difficult at all   ? ? ? ?  04/30/2022  ?  8:56 AM  ?Depression screen PHQ 2/9  ?Decreased Interest 0  ?Down, Depressed, Hopeless 0  ?PHQ - 2 Score 0  ?Altered sleeping 1  ?Tired, decreased energy 0  ?Change in appetite 0  ?Feeling bad or failure about yourself  0  ?Trouble concentrating 0  ?Moving slowly or fidgety/restless 0  ?Suicidal thoughts 0  ?PHQ-9 Score 1  ? ? ?BP Readings from Last 3 Encounters:  ?04/30/22 122/68  ?10/31/21 128/70  ?05/10/21 122/82  ? ? ?Physical Exam ?Vitals and nursing note reviewed.  ?Constitutional:   ?   General: She is not in acute distress. ?   Appearance: She is well-developed.  ?HENT:  ?   Head: Normocephalic and atraumatic.  ?Cardiovascular:  ?   Rate and Rhythm: Normal rate and regular rhythm.  ?   Pulses: Normal pulses.  ?   Heart sounds: No murmur heard. ?Pulmonary:  ?   Effort: Pulmonary effort is normal. No respiratory distress.  ?   Breath sounds: No wheezing or rhonchi.  ?Musculoskeletal:  ?   Cervical back: Normal and normal range of motion. No spasms or tenderness. Normal range of motion.  ?   Right lower leg: No edema.  ?   Left lower leg: No edema.  ?Lymphadenopathy:  ?   Cervical: No cervical adenopathy.  ?Skin: ?   General: Skin is warm and dry.  ?   Capillary Refill: Capillary refill takes less than 2 seconds.  ?   Findings: No rash.  ?Neurological:  ?   General: No focal deficit present.  ?   Mental Status: She is alert and oriented to person, place, and time.  ?Psychiatric:     ?   Mood and Affect: Mood normal.     ?   Behavior: Behavior normal.  ? ? ?Wt Readings from Last 3 Encounters:  ?04/30/22 161 lb (73 kg)  ?10/31/21 159 lb (72.1 kg)  ?05/10/21 161 lb  3.2 oz (73.1 kg)  ? ? ?BP 122/68   Pulse 81   Ht 5' 5" (1.651 m)   Wt 161 lb (73 kg)   SpO2 97%   BMI 26.79 kg/m?  ? ?Assessment and Plan: ?1. Essential hypertension ?Clinically stable exam with  well controlled BP. ?Tolerating medications without side effects at this time. ?Pt to continue current regimen and low sodium diet; benefits of regular exercise as able discussed. ?- amLODipine (NORVASC) 5 MG tablet; Take 1 tablet (5 mg total) by mouth daily.  Dispense: 90 tablet; Refill: 3 ? ?2. Muscle spasms of neck ?Continue heat or ice and Tylenol as needed. ?Expect continued slow improvement ? ?She is being seen after this visit for her MAW. ? ?Partially dictated using Editor, commissioning. Any errors are unintentional. ? ?Halina Maidens, MD ?[facility] ?[facility] ? ?04/30/2022 ? ? ? ? ? ?

## 2022-04-30 NOTE — Progress Notes (Signed)
? ?Subjective:  ? Kristy Sellers is a 81 y.o. female who presents for Medicare Annual (Subsequent) preventive examination. ? ?I connected with  Adolphus Birchwood on 04/30/22 by an in person visit and verified that I am speaking with the correct person using two identifiers. ? ?Patient Location: Other:  [place] ? ?Provider Location: Home Office ? ?I discussed the limitations of evaluation and management by telemedicine. The patient expressed understanding and agreed to proceed.  ? ? ?Review of Systems    ?Defer to PCP ?  ? ?   ?Objective:  ?  ?Today's Vitals  ? 04/30/22 0910 04/30/22 0912  ?BP: 122/80   ?Pulse: 81   ?Weight: 161 lb (73 kg)   ?Height: '5\' 5"'$  (1.651 m)   ?PainSc: 0-No pain 2   ? ?Body mass index is 26.79 kg/m?. ? ? ?  05/14/2020  ? 10:30 AM 02/28/2019  ? 10:08 AM 10/14/2016  ?  8:31 AM 09/06/2015  ?  8:51 PM 09/06/2015  ?  8:42 PM  ?Advanced Directives  ?Does Patient Have a Medical Advance Directive? Yes Yes No No No  ?Type of Paramedic of [place];Living will [place];Living will     ?Copy of [place] in Chart? No - copy requested No - copy requested     ?Would patient like information on creating a medical advance directive?    Yes - Scientist, clinical (histocompatibility and immunogenetics) given;Yes - Spiritual care consult ordered Yes - Educational materials given;Yes - Spiritual care consult ordered  ? ? ?Current Medications (verified) ?Outpatient Encounter Medications as of 04/30/2022  ?Medication Sig  ? amLODipine (NORVASC) 5 MG tablet Take 1 tablet (5 mg total) by mouth daily.  ? aspirin 81 MG tablet Take 81 mg by mouth daily.  ? atropine 1 % ophthalmic solution PLACE 1 DROP INTO THE RIGHT EYE ONCE DAILY  ? prednisoLONE acetate (PRED FORTE) 1 % ophthalmic suspension   ? simvastatin (ZOCOR) 10 MG tablet Take 1 tablet (10 mg total) by mouth daily.  ? [DISCONTINUED] amLODipine (NORVASC) 5 MG tablet Take 1 tablet (5 mg total) by mouth daily.  ? ?No facility-administered  encounter medications on file as of 04/30/2022.  ? ? ?Allergies (verified) ?Patient has no known allergies.  ? ?History: ?Past Medical History:  ?Diagnosis Date  ? GERD (gastroesophageal reflux disease)   ? History of detached retina repair   ? Hyperlipidemia   ? Hypertension   ? Mood disorder ([place]) 03/12/2021  ? ?Past Surgical History:  ?Procedure Laterality Date  ? ABDOMINAL HYSTERECTOMY    ? COLONOSCOPY  ~2006  ? normal  ? RETINAL DETACHMENT SURGERY Right   ? ?Family History  ?Problem Relation Age of Onset  ? Parkinson's disease Mother   ? CAD Father   ? CAD Son   ?     37  ? Breast cancer Neg Hx   ? ?Social History  ? ?Socioeconomic History  ? Marital status: Widowed  ?  Spouse name: Not on file  ? Number of children: 4  ? Years of education: Not on file  ? Highest education level: Some college, no degree  ?Occupational History  ? Not on file  ?Tobacco Use  ? Smoking status: Former  ?  Types: Cigarettes  ?  Quit date: 12/30/1983  ?  Years since quitting: 38.3  ? Smokeless tobacco: Never  ?Vaping Use  ? Vaping Use: Never used  ?Substance and Sexual Activity  ? Alcohol use: No  ?  Alcohol/week: 0.0 standard drinks  ? Drug use: Never  ? Sexual activity: Not Currently  ?Other Topics Concern  ? Not on file  ?Social History Narrative  ? Retired. Husband of 49 years passed away from lung cancer April 24, 2020.   ? ?Social Determinants of Health  ? ?Financial Resource Strain: Low Risk   ? Difficulty of Paying Living Expenses: Not hard at all  ?Food Insecurity: No Food Insecurity  ? Worried About Charity fundraiser in the Last Year: Never true  ? Ran Out of Food in the Last Year: Never true  ?Transportation Needs: No Transportation Needs  ? Lack of Transportation (Medical): No  ? Lack of Transportation (Non-Medical): No  ?Physical Activity: Sufficiently Active  ? Days of Exercise per Week: 5 days  ? Minutes of Exercise per Session: 50 min  ?Stress: Stress Concern Present  ? Feeling of Stress : To some extent  ?Social Connections:  Unknown  ? Frequency of Communication with Friends and Family: Patient refused  ? Frequency of Social Gatherings with Friends and Family: Patient refused  ? Attends Religious Services: Patient refused  ? Active Member of Clubs or Organizations: Patient refused  ? Attends Archivist Meetings: Patient refused  ? Marital Status: Patient refused  ? ? ?Tobacco Counseling ?Counseling given: Not Answered ? ? ?Clinical Intake: ? ?Pre-visit preparation completed: Yes ? ?Pain : No/denies pain ?Pain Score: 2  (neck PAIN) ? ?  ? ?BMI - recorded: 26.79 ?Nutritional Status: BMI 25 -29 Overweight ?Nutritional Risks: Non-healing wound ?Diabetes: No ? ?How often do you need to have someone help you when you read instructions, pamphlets, or other written materials from your doctor or pharmacy?: 1 - Never ? ?Diabetic? NO ? ?Interpreter Needed?: No ? ?Information entered by :: Wyatt Haste, CMA ? ? ?Activities of Daily Living ? ?  04/30/2022  ?  8:56 AM 05/10/2021  ?  8:51 AM  ?In your present state of health, do you have any difficulty performing the following activities:  ?Hearing? 0 0  ?Vision? 1 0  ?Difficulty concentrating or making decisions? 0 0  ?Walking or climbing stairs? 1 0  ?Dressing or bathing? 0 0  ?Doing errands, shopping? 0 0  ? ? ?Patient Care Team: ?Glean Hess, MD as PCP - General (Family Medicine) ?Golden Circle, MD as Referring Physician (Ophthalmology) ? ?Indicate any recent Medical Services you may have received from other than [facility] providers in the past year (date may be approximate). ? ?   ?Assessment:  ? This is a routine wellness examination for Kristy Sellers. ? ?Hearing/Vision screen ?No concerns for hearing. 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 ? ?Dietary issues and exercise activities discussed: Normal. No changes needed. ?  ? ? Goals Addressed   ? ?  ?  ?  ?  ? This Visit's Progress  ?  Patient Stated   On track  ?  Patient states she  would like to be more socially involved with volunteering and within her community. Also have more personal time.  ? ?  ?  Patient Stated     ?  Wants to get involved more socially with seniors. ?  ? ?  ?Depression Screen ? ?  04/30/2022  ?  8:56 AM 10/31/2021  ?  8:52 AM 05/10/2021  ?  8:50 AM 03/12/2021  ? 10:13 AM 10/29/2020  ?  8:57 AM 06/14/2020  ? 10:57 AM 05/14/2020  ?  10:31 AM  ?PHQ 2/9 Scores  ?PHQ - 2 Score 0 0 0 0 '2 2 5  '$ ?PHQ- 9 Score 1 0 '2 4 5 5 9  '$ ?  ?Fall Risk ? ?  04/30/2022  ?  8:56 AM 10/31/2021  ?  8:52 AM 05/10/2021  ?  8:51 AM 03/12/2021  ? 10:13 AM 10/29/2020  ?  8:57 AM  ?Fall Risk   ?Falls in the past year? 0 0 0 0 0  ?Number falls in past yr: 0 0 0    ?Injury with Fall? 0 0 0    ?Risk for fall due to : No Fall Risks No Fall Risks     ?Follow up Falls evaluation completed Falls evaluation completed Falls evaluation completed Falls evaluation completed Falls evaluation completed  ? ? ?FALL RISK PREVENTION PERTAINING TO THE HOME: ? ?Any stairs in or around the home? Yes  14 steps going up stairs, 4 brick steps out front of porch.  ?If so, are there any without handrails? Yes  ?Home free of loose throw rugs in walkways, pet beds, electrical cords, etc? Yes  ?Adequate lighting in your home to reduce risk of falls? Yes  ? ?ASSISTIVE DEVICES UTILIZED TO PREVENT FALLS: ? ?Life alert? Yes  ?Use of a cane, walker or w/c? No  ?Grab bars in the bathroom? Yes  ?Shower chair or bench in shower? Yes  ?Elevated toilet seat or a handicapped toilet? Yes  ? ?TIMED UP AND GO: ? ?Was the test performed? No .  ?Length of time to ambulate 10 feet: N/A sec.  ? ?Gait steady and fast without use of assistive device ? ?Cognitive Function: ?  ?  ? ?  04/30/2022  ?  9:16 AM 02/28/2019  ? 10:15 AM 10/15/2017  ?  8:43 AM 10/14/2016  ?  8:33 AM  ?6CIT Screen  ?What Year? 0 points 0 points 0 points 0 points  ?What month? 0 points 0 points 0 points 0 points  ?What time? 0 points 0 points 0 points 0 points  ?Count back from 20 0 points 0 points  0 points 0 points  ?Months in reverse 0 points 0 points 0 points 0 points  ?Repeat phrase 2 points 0 points 4 points 0 points  ?Total Score 2 points 0 points 4 points 0 points  ? ? ?Immunizations ?Immunization

## 2022-09-23 ENCOUNTER — Other Ambulatory Visit: Payer: Self-pay | Admitting: Internal Medicine

## 2022-09-23 DIAGNOSIS — Z1231 Encounter for screening mammogram for malignant neoplasm of breast: Secondary | ICD-10-CM

## 2022-10-16 ENCOUNTER — Ambulatory Visit: Payer: Medicare Other

## 2022-10-20 ENCOUNTER — Ambulatory Visit
Admission: RE | Admit: 2022-10-20 | Discharge: 2022-10-20 | Disposition: A | Discharge status: Home / Self Care | Payer: Medicare Other | Source: Ambulatory Visit | Attending: Internal Medicine | Admitting: Internal Medicine

## 2022-10-20 DIAGNOSIS — Z1231 Encounter for screening mammogram for malignant neoplasm of breast: Secondary | ICD-10-CM | POA: Diagnosis present

## 2022-11-03 ENCOUNTER — Ambulatory Visit (INDEPENDENT_AMBULATORY_CARE_PROVIDER_SITE_OTHER): Payer: Medicare Other | Admitting: Internal Medicine

## 2022-11-03 ENCOUNTER — Encounter: Payer: Self-pay | Admitting: Internal Medicine

## 2022-11-03 VITALS — BP 138/72 | HR 78 | Ht 65.0 in | Wt 158.4 lb

## 2022-11-03 DIAGNOSIS — Z0000 Encounter for general adult medical examination without abnormal findings: Secondary | ICD-10-CM | POA: Diagnosis not present

## 2022-11-03 DIAGNOSIS — E782 Mixed hyperlipidemia: Secondary | ICD-10-CM

## 2022-11-03 DIAGNOSIS — I10 Essential (primary) hypertension: Secondary | ICD-10-CM | POA: Diagnosis not present

## 2022-11-03 DIAGNOSIS — Z23 Encounter for immunization: Secondary | ICD-10-CM

## 2022-11-03 DIAGNOSIS — K219 Gastro-esophageal reflux disease without esophagitis: Secondary | ICD-10-CM

## 2022-11-03 LAB — POCT URINALYSIS DIPSTICK
Bilirubin, UA: NEGATIVE
Blood, UA: NEGATIVE
Glucose, UA: NEGATIVE
Ketones, UA: NEGATIVE
Leukocytes, UA: NEGATIVE
Nitrite, UA: NEGATIVE
Protein, UA: NEGATIVE
Spec Grav, UA: 1.01 (ref 1.010–1.025)
Urobilinogen, UA: 0.2 E.U./dL
pH, UA: 5 (ref 5.0–8.0)

## 2022-11-03 NOTE — Progress Notes (Signed)
Date:  11/03/2022   Name:  Kristy Sellers   DOB:  July 31, 1941   MRN:  [id number]   Chief Complaint: Annual Exam Kristy Sellers is a 81 y.o. female who presents today for her Complete Annual Exam. She feels well. She reports exercising. She reports she is sleeping poorly. Breast complaints - none.  Mammogram: 09/2022 DEXA: 2016 Colonoscopy: Cologuard 10/2021 negative  Health Maintenance Due  Topic Date Due   Zoster Vaccines- Shingrix (1 of 2) Never done    Immunization History  Administered Date(s) Administered   Fluad Quad(high Dose 65+) 09/21/2019, 10/11/2020, 10/31/2021   Influenza, High Dose Seasonal PF 09/17/2018   Influenza,inj,Quad PF,6+ Mos 10/14/2016, 10/15/2017   Influenza-Unspecified 09/16/2022   Moderna Covid-19 Vaccine Bivalent Booster 56yr & up 09/26/2022   PFIZER(Purple Top)SARS-COV-2 Vaccination 12/31/2019, 01/21/2020, 10/18/2020   Pneumococcal Conjugate-13 10/15/2017   Pneumococcal Polysaccharide-23 01/12/2014   Tdap 07/10/2014   Zoster, Live 09/28/2013    Hypertension This is a chronic problem. The problem is controlled. Associated symptoms include chest pain (intermittent left sided - occurs briefly mostly while at rest). Pertinent negatives include no headaches, palpitations or shortness of breath. Past treatments include calcium channel blockers. The current treatment provides significant improvement. There are no compliance problems.  There is no history of kidney disease, CAD/MI or CVA.  Hyperlipidemia This is a chronic problem. The problem is controlled. Associated symptoms include chest pain (intermittent left sided - occurs briefly mostly while at rest). Pertinent negatives include no shortness of breath. Current antihyperlipidemic treatment includes statins. The current treatment provides significant improvement of lipids.    Lab Results  Component Value Date   NA 142 10/31/2021   K 3.8 10/31/2021   CO2 26 10/31/2021   GLUCOSE 89 10/31/2021   BUN 13  10/31/2021   CREATININE 0.74 10/31/2021   CALCIUM 9.2 10/31/2021   EGFR 82 10/31/2021   GFRNONAA 65 10/29/2020   Lab Results  Component Value Date   CHOL 179 10/31/2021   HDL 73 10/31/2021   LDLCALC 97 10/31/2021   TRIG 47 10/31/2021   CHOLHDL 2.5 10/31/2021   Lab Results  Component Value Date   TSH 2.200 10/31/2021   No results found for: "HGBA1C" Lab Results  Component Value Date   WBC 8.2 10/31/2021   HGB 12.8 10/31/2021   HCT 39.9 10/31/2021   MCV 79 10/31/2021   PLT 275 10/31/2021   Lab Results  Component Value Date   ALT 10 10/31/2021   AST 16 10/31/2021   ALKPHOS 92 10/31/2021   BILITOT 0.6 10/31/2021   No results found for: "25OHVITD2", "25OHVITD3", "VD25OH"   Review of Systems  Constitutional:  Negative for chills, fatigue and fever.  HENT:  Negative for congestion, hearing loss, tinnitus, trouble swallowing and voice change.   Eyes:  Positive for visual disturbance.  Respiratory:  Negative for cough, chest tightness, shortness of breath and wheezing.   Cardiovascular:  Positive for chest pain (intermittent left sided - occurs briefly mostly while at rest). Negative for palpitations and leg swelling.  Gastrointestinal:  Negative for abdominal pain, constipation, diarrhea and vomiting.  Endocrine: Negative for polydipsia and polyuria.  Genitourinary:  Negative for dysuria, frequency, genital sores, vaginal bleeding and vaginal discharge.  Musculoskeletal:  Negative for arthralgias, gait problem and joint swelling.  Skin:  Negative for color change and rash.  Neurological:  Negative for dizziness, tremors, light-headedness and headaches.  Hematological:  Negative for adenopathy. Does not bruise/bleed easily.  Psychiatric/Behavioral:  Negative for dysphoric mood.  Sleep disturbance: worry over her great granddaughters leukemia dx.The patient is not nervous/anxious.     Patient Active Problem List   Diagnosis Date Noted   Gastroesophageal reflux disease  without esophagitis 05/14/2020   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   Chronic lumbar radiculopathy 10/15/2017   Arthritis of knee 10/15/2017   Primary osteoarthritis of left hip 10/15/2017   Mixed hyperlipidemia 06/11/2016   Blind right eye 06/11/2016   History of Clostridium difficile colitis 06/11/2016   History of positive PPD 06/11/2016   Pseudoaphakia 05/18/2016   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   Ocular hypertension 05/18/2016   Cataract 05/18/2016   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   Secondary hypotony of eye 09/27/2014   Essential hypertension 09/27/2014    No Known Allergies  Past Surgical History:  Procedure Laterality Date   ABDOMINAL HYSTERECTOMY     COLONOSCOPY  ~2006   normal   RETINAL DETACHMENT SURGERY Right     Social History   Tobacco Use   Smoking status: Former    Packs/day: 0.50    Years: 20.00    Total pack years: 10.00    Types: Cigarettes    Quit date: 12/30/1983    Years since quitting: 38.8   Smokeless tobacco: Never  Vaping Use   Vaping Use: Never used  Substance Use Topics   Alcohol use: No    Alcohol/week: 0.0 standard drinks of alcohol   Drug use: Never     Medication list has been reviewed and updated.  Current Meds  Medication Sig   amLODipine (NORVASC) 5 MG tablet Take 1 tablet (5 mg total) by mouth daily.   aspirin 81 MG tablet Take 81 mg by mouth daily.   atropine 1 % ophthalmic solution PLACE 1 DROP INTO THE RIGHT EYE ONCE DAILY   simvastatin (ZOCOR) 10 MG tablet Take 1 tablet (10 mg total) by mouth daily.       11/03/2022    8:38 AM 04/30/2022    8:56 AM 10/31/2021    8:52 AM 05/10/2021    8:51 AM  GAD 7 : Generalized Anxiety Score  Nervous, Anxious, on Edge 0 0 0 0  Control/stop worrying 0 0 0 0  Worry too much - different things 2 1 0 0  Trouble relaxing 0 0 0 0  Restless 0 0 0 0  Easily annoyed or irritable 2 0 0 0  Afraid - awful might happen 0 0 0 0  Total GAD 7 Score 4 1 0 0  Anxiety Difficulty Somewhat difficult  Not  difficult at all Not difficult at all       11/03/2022    8:38 AM 04/30/2022    8:56 AM 10/31/2021    8:52 AM  Depression screen PHQ 2/9  Decreased Interest 0 0 0  Down, Depressed, Hopeless 1 0 0  PHQ - 2 Score 1 0 0  Altered sleeping 2 1 0  Tired, decreased energy 0 0 0  Change in appetite 0 0 0  Feeling bad or failure about yourself  0 0 0  Trouble concentrating 0 0 0  Moving slowly or fidgety/restless 0 0 0  Suicidal thoughts 0 0 0  PHQ-9 Score 3 1 0  Difficult doing work/chores Not difficult at all  Not difficult at all    BP Readings from Last 3 Encounters:  11/03/22 138/72  04/30/22 122/80  04/30/22 122/68    Physical Exam Vitals and nursing note reviewed.  Constitutional:      General: She is not  in acute distress.    Appearance: She is well-developed.  HENT:     Head: Normocephalic and atraumatic.     Right Ear: Tympanic membrane and ear canal normal.     Left Ear: Tympanic membrane and ear canal normal.     Nose:     Right Sinus: No maxillary sinus tenderness.     Left Sinus: No maxillary sinus tenderness.  Eyes:     General: No scleral icterus.       Right eye: No discharge.        Left eye: No discharge.     Conjunctiva/sclera: Conjunctivae normal.  Neck:     Thyroid: No thyromegaly.     Vascular: No carotid bruit or JVD.     Trachea: Trachea normal.  Cardiovascular:     Rate and Rhythm: Normal rate and regular rhythm. No extrasystoles are present.    Pulses: Normal pulses.     Heart sounds: Normal heart sounds. No murmur heard. Pulmonary:     Effort: Pulmonary effort is normal. No respiratory distress.     Breath sounds: No wheezing.  Chest:  Breasts:    Right: No mass, nipple discharge, skin change or tenderness.     Left: No mass, nipple discharge, skin change or tenderness.  Abdominal:     General: Bowel sounds are normal.     Palpations: Abdomen is soft.     Tenderness: There is no abdominal tenderness.  Musculoskeletal:     Cervical back:  Normal range of motion. No erythema.     Right lower leg: No edema.     Left lower leg: No edema.  Lymphadenopathy:     Cervical: No cervical adenopathy.  Skin:    General: Skin is warm and dry.     Capillary Refill: Capillary refill takes less than 2 seconds.     Findings: No rash.  Neurological:     General: No focal deficit present.     Mental Status: She is alert and oriented to person, place, and time.     Cranial Nerves: No cranial nerve deficit.     Sensory: No sensory deficit.     Deep Tendon Reflexes: Reflexes are normal and symmetric.  Psychiatric:        Attention and Perception: Attention normal.        Mood and Affect: Mood normal.     Wt Readings from Last 3 Encounters:  11/03/22 158 lb 6.4 oz (71.8 kg)  04/30/22 161 lb (73 kg)  04/30/22 161 lb (73 kg)    BP 138/72 (BP Location: Left Arm, Patient Position: Sitting, Cuff Size: Normal)   Pulse 78   Ht _0  (1.651 m)   Wt 158 lb 6.4 oz (71.8 kg)   SpO2 97%   BMI 26.36 kg/m   Assessment and Plan: 1. Annual physical exam Normal exam. Up to date on screenings and immunizations. Continue healthy diet and exercise.  2. Essential hypertension Clinically stable exam with well controlled BP. Tolerating medications without side effects at this time. Pt to continue current regimen and low sodium diet; benefits of regular exercise as able discussed. Occasional chest pain is atypical and unlikely to be cardiac in etiology - pt will monitor and follow up if needed - CBC with Differential/Platelet - Comprehensive metabolic panel - TSH - POCT urinalysis dipstick - EKG 12-Lead - SR @ 65; LAFB and possible ant infarct - no change from 2022  3. Mixed hyperlipidemia Tolerating statin medication without side effects at  this time LDL is at goal of < 70 on current dose Continue same therapy without change at this time. - Lipid panel  4. Gastroesophageal reflux disease without esophagitis Minimal symptoms that may be  from dairy intolerance - she will continue to modify her diet and use TUMS as needed. Follow up if worsening. - CBC with Differential/Platelet  5. Need for vaccination for pneumococcus - Pneumococcal conjugate vaccine 20-valent   Partially dictated using Editor, commissioning. Any errors are unintentional.  Halina Maidens, MD [facility]  11/03/2022

## 2022-11-05 ENCOUNTER — Other Ambulatory Visit: Payer: Self-pay

## 2022-11-05 DIAGNOSIS — D72829 Elevated white blood cell count, unspecified: Secondary | ICD-10-CM

## 2022-11-05 LAB — CBC WITH DIFFERENTIAL/PLATELET
Basophils Absolute: 0 10*3/uL (ref 0.0–0.2)
Basos: 0 %
EOS (ABSOLUTE): 0.1 10*3/uL (ref 0.0–0.4)
Eos: 1 %
Hematocrit: 39.6 % (ref 34.0–46.6)
Hemoglobin: 12.6 g/dL (ref 11.1–15.9)
Immature Grans (Abs): 0 10*3/uL (ref 0.0–0.1)
Immature Granulocytes: 0 %
Lymphocytes Absolute: 1.1 10*3/uL (ref 0.7–3.1)
Lymphs: 10 %
MCH: 25.7 pg — ABNORMAL LOW (ref 26.6–33.0)
MCHC: 31.8 g/dL (ref 31.5–35.7)
MCV: 81 fL (ref 79–97)
Monocytes Absolute: 0.7 10*3/uL (ref 0.1–0.9)
Monocytes: 6 %
Neutrophils Absolute: 9.6 10*3/uL — ABNORMAL HIGH (ref 1.4–7.0)
Neutrophils: 83 %
Platelets: 261 10*3/uL (ref 150–450)
RBC: 4.91 x10E6/uL (ref 3.77–5.28)
RDW: 15.4 % (ref 11.7–15.4)
WBC: 11.6 10*3/uL — ABNORMAL HIGH (ref 3.4–10.8)

## 2022-11-05 LAB — LIPID PANEL
Chol/HDL Ratio: 2.3 ratio (ref 0.0–4.4)
Cholesterol, Total: 156 mg/dL (ref 100–199)
HDL: 69 mg/dL (ref >39)
LDL Chol Calc (NIH): 75 mg/dL (ref 0–99)
Triglycerides: 61 mg/dL (ref 0–149)
VLDL Cholesterol Cal: 12 mg/dL (ref 5–40)

## 2022-11-05 LAB — COMPREHENSIVE METABOLIC PANEL
ALT: 8 IU/L (ref 0–32)
AST: 15 IU/L (ref 0–40)
Albumin/Globulin Ratio: 1.7 (ref 1.2–2.2)
Albumin: 4.3 g/dL (ref 3.7–4.7)
Alkaline Phosphatase: 87 IU/L (ref 44–121)
BUN/Creatinine Ratio: 16 (ref 12–28)
BUN: 15 mg/dL (ref 8–27)
Bilirubin Total: 0.5 mg/dL (ref 0.0–1.2)
CO2: 24 mmol/L (ref 20–29)
Calcium: 9.3 mg/dL (ref 8.7–10.3)
Chloride: 102 mmol/L (ref 96–106)
Creatinine, Ser: 0.91 mg/dL (ref 0.57–1.00)
Globulin, Total: 2.5 g/dL (ref 1.5–4.5)
Glucose: 84 mg/dL (ref 70–99)
Potassium: 3.8 mmol/L (ref 3.5–5.2)
Sodium: 144 mmol/L (ref 134–144)
Total Protein: 6.8 g/dL (ref 6.0–8.5)
eGFR: 63 mL/min/{1.73_m2} (ref >59)

## 2022-11-05 LAB — TSH: TSH: 1.56 u[IU]/mL (ref 0.450–4.500)

## 2022-12-06 LAB — CBC WITH DIFFERENTIAL/PLATELET
Basophils Absolute: 0 10*3/uL (ref 0.0–0.2)
Basos: 1 %
EOS (ABSOLUTE): 0.3 10*3/uL (ref 0.0–0.4)
Eos: 4 %
Hematocrit: 39.4 % (ref 34.0–46.6)
Hemoglobin: 12.4 g/dL (ref 11.1–15.9)
Immature Grans (Abs): 0.1 10*3/uL (ref 0.0–0.1)
Immature Granulocytes: 1 %
Lymphocytes Absolute: 1.8 10*3/uL (ref 0.7–3.1)
Lymphs: 21 %
MCH: 25.6 pg — ABNORMAL LOW (ref 26.6–33.0)
MCHC: 31.5 g/dL (ref 31.5–35.7)
MCV: 81 fL (ref 79–97)
Monocytes Absolute: 0.6 10*3/uL (ref 0.1–0.9)
Monocytes: 8 %
Neutrophils Absolute: 5.4 10*3/uL (ref 1.4–7.0)
Neutrophils: 65 %
Platelets: 276 10*3/uL (ref 150–450)
RBC: 4.85 x10E6/uL (ref 3.77–5.28)
RDW: 15.5 % — ABNORMAL HIGH (ref 11.7–15.4)
WBC: 8.3 10*3/uL (ref 3.4–10.8)

## 2022-12-07 ENCOUNTER — Other Ambulatory Visit: Payer: Self-pay | Admitting: Internal Medicine

## 2022-12-07 DIAGNOSIS — E782 Mixed hyperlipidemia: Secondary | ICD-10-CM

## 2023-04-15 ENCOUNTER — Telehealth: Payer: Self-pay | Admitting: Internal Medicine

## 2023-04-15 NOTE — Telephone Encounter (Signed)
Contacted Kristy Sellers to schedule their annual wellness visit. Appointment made for 05/07/2023.   Kristy Sellers; Care Guide Ambulatory Clinical Support [place] [facility] Direct Dial: [phone]

## 2023-05-05 ENCOUNTER — Ambulatory Visit: Payer: Medicare Other | Admitting: Internal Medicine

## 2023-05-06 ENCOUNTER — Other Ambulatory Visit: Payer: Self-pay | Admitting: Internal Medicine

## 2023-05-06 DIAGNOSIS — I10 Essential (primary) hypertension: Secondary | ICD-10-CM

## 2023-05-06 NOTE — Telephone Encounter (Signed)
Requested Prescriptions  Pending Prescriptions Disp Refills   amLODipine (NORVASC) 5 MG tablet [Pharmacy Med Name: AMLODIPINE BESYLATE 5 MG TAB] 90 tablet 3    Sig: TAKE 1 TABLET (5 MG TOTAL) BY MOUTH DAILY.     Cardiovascular: Calcium Channel Blockers 2 Failed - 05/06/2023  2:35 AM      Failed - Valid encounter within last 6 months    Recent Outpatient Visits           6 months ago Essential hypertension   [place] [facility], Nyoka Cowden, MD   1 year ago Essential hypertension   [place] [facility], Nyoka Cowden, MD   1 year ago Essential hypertension   [place] [facility], Nyoka Cowden, MD   1 year ago Essential hypertension   [place] [facility] at [facility], Nyoka Cowden, MD   2 years ago Essential hypertension   [place] [facility] at [facility], Nyoka Cowden, MD       Future Appointments             In 2 weeks Judithann Graves Nyoka Cowden, MD [facility] at [facility], [facility]   In 6 months Judithann Graves Nyoka Cowden, MD [facility] at [facility], [facility]            Passed - Last BP in normal range    BP Readings from Last 1 Encounters:  11/03/22 138/72         Passed - Last Heart Rate in normal range    Pulse Readings from Last 1 Encounters:  11/03/22 78

## 2023-05-22 ENCOUNTER — Encounter: Payer: Self-pay | Admitting: Internal Medicine

## 2023-05-22 ENCOUNTER — Ambulatory Visit (INDEPENDENT_AMBULATORY_CARE_PROVIDER_SITE_OTHER): Payer: Medicare Other | Admitting: Internal Medicine

## 2023-05-22 ENCOUNTER — Ambulatory Visit (INDEPENDENT_AMBULATORY_CARE_PROVIDER_SITE_OTHER): Payer: Medicare Other

## 2023-05-22 VITALS — BP 128/74 | HR 71 | Ht 65.0 in | Wt 158.0 lb

## 2023-05-22 DIAGNOSIS — I10 Essential (primary) hypertension: Secondary | ICD-10-CM | POA: Diagnosis not present

## 2023-05-22 DIAGNOSIS — Z0000 Encounter for general adult medical examination without abnormal findings: Secondary | ICD-10-CM

## 2023-05-22 NOTE — Progress Notes (Signed)
Date:  05/22/2023   Name:  Kristy Sellers   DOB:  10/14/1941   MRN:  [id number]   Chief Complaint: Hypertension  Hypertension This is a chronic problem. The problem is unchanged. The problem is controlled. Pertinent negatives include no chest pain, headaches, palpitations or shortness of breath. Past treatments include calcium channel blockers. The current treatment provides significant improvement.    Lab Results  Component Value Date   NA 144 11/04/2022   K 3.8 11/04/2022   CO2 24 11/04/2022   GLUCOSE 84 11/04/2022   BUN 15 11/04/2022   CREATININE 0.91 11/04/2022   CALCIUM 9.3 11/04/2022   EGFR 63 11/04/2022   GFRNONAA 65 10/29/2020   Lab Results  Component Value Date   CHOL 156 11/04/2022   HDL 69 11/04/2022   LDLCALC 75 11/04/2022   TRIG 61 11/04/2022   CHOLHDL 2.3 11/04/2022   Lab Results  Component Value Date   TSH 1.560 11/04/2022   No results found for: "HGBA1C" Lab Results  Component Value Date   WBC 8.3 12/05/2022   HGB 12.4 12/05/2022   HCT 39.4 12/05/2022   MCV 81 12/05/2022   PLT 276 12/05/2022   Lab Results  Component Value Date   ALT 8 11/04/2022   AST 15 11/04/2022   ALKPHOS 87 11/04/2022   BILITOT 0.5 11/04/2022   No results found for: "25OHVITD2", "25OHVITD3", "VD25OH"   Review of Systems  Constitutional:  Negative for fatigue and unexpected weight change.  HENT:  Negative for nosebleeds.   Eyes:  Negative for visual disturbance.  Respiratory:  Negative for cough, chest tightness, shortness of breath and wheezing.   Cardiovascular:  Negative for chest pain, palpitations and leg swelling.  Gastrointestinal:  Negative for abdominal pain, constipation and diarrhea.  Neurological:  Negative for dizziness, weakness, light-headedness and headaches.  Psychiatric/Behavioral:  Negative for dysphoric mood (but she is lonely since her husband died.) and sleep disturbance. The patient is not nervous/anxious.     Patient Active Problem List    Diagnosis Date Noted   Gastroesophageal reflux disease without esophagitis 05/14/2020   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   Chronic lumbar radiculopathy 10/15/2017   Arthritis of knee 10/15/2017   Primary osteoarthritis of left hip 10/15/2017   Mixed hyperlipidemia 06/11/2016   Blind right eye 06/11/2016   History of Clostridium difficile colitis 06/11/2016   History of positive PPD 06/11/2016   Pseudoaphakia 05/18/2016   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   Ocular hypertension 05/18/2016   Cataract 05/18/2016   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   Secondary hypotony of eye 09/27/2014   Essential hypertension 09/27/2014    No Known Allergies  Past Surgical History:  Procedure Laterality Date   ABDOMINAL HYSTERECTOMY     COLONOSCOPY  ~2006   normal   RETINAL DETACHMENT SURGERY Right     Social History   Tobacco Use   Smoking status: Former    Packs/day: 0.50    Years: 20.00    Additional pack years: 0.00    Total pack years: 10.00    Types: Cigarettes    Quit date: 12/30/1983    Years since quitting: 39.4   Smokeless tobacco: Never  Vaping Use   Vaping Use: Never used  Substance Use Topics   Alcohol use: No    Alcohol/week: 0.0 standard drinks of alcohol   Drug use: Never     Medication list has been reviewed and updated.  Current Meds  Medication Sig   amLODipine (NORVASC) 5 MG tablet TAKE  1 TABLET (5 MG TOTAL) BY MOUTH DAILY.   aspirin 81 MG tablet Take 81 mg by mouth daily.   atropine 1 % ophthalmic solution PLACE 1 DROP INTO THE RIGHT EYE ONCE DAILY   prednisoLONE acetate (PRED FORTE) 1 % ophthalmic suspension    simvastatin (ZOCOR) 10 MG tablet TAKE 1 TABLET BY MOUTH EVERY DAY       05/22/2023    9:06 AM 11/03/2022    8:38 AM 04/30/2022    8:56 AM 10/31/2021    8:52 AM  GAD 7 : Generalized Anxiety Score  Nervous, Anxious, on Edge 0 0 0 0  Control/stop worrying 0 0 0 0  Worry too much - different things 0 2 1 0  Trouble relaxing 0 0 0 0  Restless 0 0 0 0   Easily annoyed or irritable 0 2 0 0  Afraid - awful might happen 0 0 0 0  Total GAD 7 Score 0 4 1 0  Anxiety Difficulty Not difficult at all Somewhat difficult  Not difficult at all       05/22/2023    9:06 AM 11/03/2022    8:38 AM 04/30/2022    8:56 AM  Depression screen PHQ 2/9  Decreased Interest 0 0 0  Down, Depressed, Hopeless 0 1 0  PHQ - 2 Score 0 1 0  Altered sleeping 0 2 1  Tired, decreased energy 0 0 0  Change in appetite 0 0 0  Feeling bad or failure about yourself  0 0 0  Trouble concentrating 0 0 0  Moving slowly or fidgety/restless 0 0 0  Suicidal thoughts 0 0 0  PHQ-9 Score 0 3 1  Difficult doing work/chores Not difficult at all Not difficult at all     BP Readings from Last 3 Encounters:  05/22/23 128/74  11/03/22 138/72  04/30/22 122/80    Physical Exam Vitals and nursing note reviewed.  Constitutional:      General: She is not in acute distress.    Appearance: She is well-developed.  HENT:     Head: Normocephalic and atraumatic.  Neck:     Vascular: No carotid bruit.  Cardiovascular:     Rate and Rhythm: Normal rate and regular rhythm.     Heart sounds: No murmur heard. Pulmonary:     Effort: Pulmonary effort is normal. No respiratory distress.     Breath sounds: No wheezing or rhonchi.  Musculoskeletal:     Right lower leg: No edema.     Left lower leg: No edema.  Lymphadenopathy:     Cervical: No cervical adenopathy.  Skin:    General: Skin is warm and dry.     Capillary Refill: Capillary refill takes less than 2 seconds.     Findings: No rash.  Neurological:     General: No focal deficit present.     Mental Status: She is alert and oriented to person, place, and time.  Psychiatric:        Mood and Affect: Mood normal.        Behavior: Behavior normal.     Wt Readings from Last 3 Encounters:  05/22/23 158 lb (71.7 kg)  11/03/22 158 lb 6.4 oz (71.8 kg)  04/30/22 161 lb (73 kg)    BP 128/74   Pulse 71   Ht 5\' 5"  (1.651 m)   Wt  158 lb (71.7 kg)   SpO2 98%   BMI 26.29 kg/m   Assessment and Plan:  Problem List Items  Addressed This Visit     Essential hypertension - Primary (Chronic)    Stable exam with well controlled BP.  Currently taking amlodipine. Tolerating medications without concerns or side effects. Will continue to recommend low sodium diet and current regimen.        No follow-ups on file.  MAW completed today by CMA. Partially dictated using Dragon software, any errors are not intentional.  Reubin Milan, MD [facility] [place], [place]

## 2023-05-22 NOTE — Progress Notes (Signed)
Subjective:   Kristy Sellers is a 82 y.o. female who presents for Medicare Annual (Subsequent) preventive examination.  I connected with  [name] on 05/22/23 by an in person visit and verified that I am speaking with the correct person using two identifiers.  Patient Location: Other:  In Office  Provider Location: Office/Clinic  I discussed the limitations of evaluation and management by telemedicine. The patient expressed understanding and agreed to proceed.   Review of Systems    Defer to PCP  Cardiac Risk Factors include: advanced age (>69men, >80 women);hypertension     Objective:    Today's Vitals   05/22/23 0909  PainSc: 0-No pain   There is no height or weight on file to calculate BMI.     05/22/2023    9:10 AM 04/30/2022    9:25 AM 05/14/2020   10:30 AM 02/28/2019   10:08 AM 10/14/2016    8:31 AM 09/06/2015    8:51 PM 09/06/2015    8:42 PM  Advanced Directives  Does Patient Have a Medical Advance Directive? Yes Yes Yes Yes No No No  Type of Advance Directive  Living will;Healthcare Power of [employer] Power of [place];Living will Healthcare Power of [employer];Living will     Does patient want to make changes to medical advance directive? No - Patient declined No - Patient declined       Copy of Healthcare Power of Attorney in Chart?  No - copy requested No - copy requested No - copy requested     Would patient like information on creating a medical advance directive?      Yes - Transport planner given;Yes - Spiritual care consult ordered Yes - Educational materials given;Yes - Spiritual care consult ordered    Current Medications (verified) Outpatient Encounter Medications as of 05/22/2023  Medication Sig   amLODipine (NORVASC) 5 MG tablet TAKE 1 TABLET (5 MG TOTAL) BY MOUTH DAILY.   aspirin 81 MG tablet Take 81 mg by mouth daily.   atropine 1 % ophthalmic solution PLACE 1 DROP INTO THE RIGHT EYE ONCE DAILY   prednisoLONE acetate (PRED FORTE) 1 %  ophthalmic suspension    simvastatin (ZOCOR) 10 MG tablet TAKE 1 TABLET BY MOUTH EVERY DAY   No facility-administered encounter medications on file as of 05/22/2023.    Allergies (verified) Patient has no known allergies.   History: Past Medical History:  Diagnosis Date   Detached retina 05/18/2016   Overview:   S/p RRD Repair OD with retained S.O.   GERD (gastroesophageal reflux disease)    History of detached retina repair    Hyperlipidemia    Hypertension    Mood disorder (HCC) 03/12/2021   Past Surgical History:  Procedure Laterality Date   ABDOMINAL HYSTERECTOMY     COLONOSCOPY  ~2006   normal   RETINAL DETACHMENT SURGERY Right    Family History  Problem Relation Age of Onset   Parkinson's disease Mother    CAD Father    CAD Son        18   Breast cancer Neg Hx    Social History   Socioeconomic History   Marital status: Widowed    Spouse name: Not on file   Number of children: 4   Years of education: Not on file   Highest education level: Some college, no degree  Occupational History   Not on file  Tobacco Use   Smoking status: Former    Packs/day: 0.50    Years: 20.00  Additional pack years: 0.00    Total pack years: 10.00    Types: Cigarettes    Quit date: 12/30/1983    Years since quitting: 39.4   Smokeless tobacco: Never  Vaping Use   Vaping Use: Never used  Substance and Sexual Activity   Alcohol use: No    Alcohol/week: 0.0 standard drinks of alcohol   Drug use: Never   Sexual activity: Not Currently  Other Topics Concern   Not on file  Social History Narrative   Retired. Husband of 61 years passed away from lung cancer 2020/04/26.    Social Determinants of Health   Financial Resource Strain: Low Risk  (05/22/2023)   Overall Financial Resource Strain (CARDIA)    Difficulty of Paying Living Expenses: Not hard at all  Food Insecurity: No Food Insecurity (05/22/2023)   Hunger Vital Sign    Worried About Running Out of Food in the Last Year:  Never true    Ran Out of Food in the Last Year: Never true  Transportation Needs: No Transportation Needs (05/22/2023)   PRAPARE - Administrator, Civil Service (Medical): No    Lack of Transportation (Non-Medical): No  Physical Activity: Sufficiently Active (05/22/2023)   Exercise Vital Sign    Days of Exercise per Week: 5 days    Minutes of Exercise per Session: 50 min  Stress: No Stress Concern Present (05/22/2023)   [employer] of Occupational Health - Occupational Stress Questionnaire    Feeling of Stress : Only a little  Social Connections: Patient Declined (04/30/2022)   Social Connection and Isolation Panel [NHANES]    Frequency of Communication with Friends and Family: Patient declined    Frequency of Social Gatherings with Friends and Family: Patient declined    Attends Religious Services: Patient declined    Database administrator or Organizations: Patient declined    Attends Engineer, structural: Patient declined    Marital Status: Patient declined    Tobacco Counseling Counseling not Needed   Clinical Intake:  Pre-visit preparation completed: Yes  Pain : No/denies pain Pain Score: 0-No pain     BMI - recorded: 26.29 Nutritional Status: BMI 25 -29 Overweight Nutritional Risks: None Diabetes: No  How often do you need to have someone help you when you read instructions, pamphlets, or other written materials from your doctor or pharmacy?: 1 - Never  Diabetic? NO.   Interpreter Needed?: No  Information entered by :: Margaretha Sheffield, CMA   Activities of Daily Living    05/22/2023    9:10 AM  In your present state of health, do you have any difficulty performing the following activities:  Hearing? 0  Vision? 1  Difficulty concentrating or making decisions? 0  Walking or climbing stairs? 0  Dressing or bathing? 0  Doing errands, shopping? 0  Preparing Food and eating ? N  Using the Toilet? N  In the past six months, have you  accidently leaked urine? N  Do you have problems with loss of bowel control? N  Managing your Medications? N  Managing your Finances? N  Housekeeping or managing your Housekeeping? N    Patient Care Team: Reubin Milan, MD as PCP - General (Family Medicine) Alta Corning Ricarda Frame, MD as Referring Physician (Ophthalmology)  Indicate any recent Medical Services you may have received from other than [facility] providers in the past year (date may be approximate).     Assessment:   This is a routine wellness examination for  Steward Drone.  Hearing/Vision screen Patient cannot see out of right eye.   Dietary issues and exercise activities discussed: Current Exercise Habits: Home exercise routine, Type of exercise: walking, Time (Minutes): 45, Frequency (Times/Week): 5, Weekly Exercise (Minutes/Week): 225, Intensity: Moderate   Goals Addressed   None   Depression Screen    05/22/2023    9:06 AM 11/03/2022    8:38 AM 04/30/2022    8:56 AM 10/31/2021    8:52 AM 05/10/2021    8:50 AM 03/12/2021   10:13 AM 10/29/2020    8:57 AM  PHQ 2/9 Scores  PHQ - 2 Score 0 1 0 0 0 0 2  PHQ- 9 Score 0 3 1 0 2 4 5     Fall Risk    05/22/2023    9:06 AM 11/03/2022    8:39 AM 04/30/2022    8:56 AM 10/31/2021    8:52 AM 05/10/2021    8:51 AM  Fall Risk   Falls in the past year? 0 0 0 0 0  Number falls in past yr: 0 0 0 0 0  Injury with Fall? 0 0 0 0 0  Risk for fall due to : No Fall Risks No Fall Risks No Fall Risks No Fall Risks   Follow up Falls evaluation completed Falls evaluation completed Falls evaluation completed Falls evaluation completed Falls evaluation completed    FALL RISK PREVENTION PERTAINING TO THE HOME:  Any stairs in or around the home? Yes  If so, are there any without handrails? No  Home free of loose throw rugs in walkways, pet beds, electrical cords, etc? Yes  Adequate lighting in your home to reduce risk of falls? Yes   ASSISTIVE DEVICES UTILIZED TO PREVENT FALLS:  Life alert?  No  Use of a cane, walker or w/c? No   TIMED UP AND GO:  Was the test performed? Yes .  Gait steady and fast without use of assistive device  Cognitive Function:        04/30/2022    9:16 AM 02/28/2019   10:15 AM 10/15/2017    8:43 AM 10/14/2016    8:33 AM  6CIT Screen  What Year? 0 points 0 points 0 points 0 points  What month? 0 points 0 points 0 points 0 points  What time? 0 points 0 points 0 points 0 points  Count back from 20 0 points 0 points 0 points 0 points  Months in reverse 0 points 0 points 0 points 0 points  Repeat phrase 2 points 0 points 4 points 0 points  Total Score 2 points 0 points 4 points 0 points    Immunizations Immunization History  Administered Date(s) Administered   Fluad Quad(high Dose 65+) 09/21/2019, 10/11/2020, 10/31/2021   Influenza, High Dose Seasonal PF 09/17/2018   Influenza,inj,Quad PF,6+ Mos 10/14/2016, 10/15/2017   Influenza-Unspecified 09/16/2022   Moderna Covid-19 Vaccine Bivalent Booster 12yrs & up 09/26/2022   PFIZER(Purple Top)SARS-COV-2 Vaccination 12/31/2019, 01/21/2020, 10/18/2020   PNEUMOCOCCAL CONJUGATE-20 11/03/2022   Pneumococcal Conjugate-13 10/15/2017   Pneumococcal Polysaccharide-23 01/12/2014   Tdap 07/10/2014   Zoster, Live 09/28/2013    Pneumococcal vaccine status: Up to date  Flu Vaccine status: Up to date  Pneumococcal vaccine status: Up to date  Covid-19 vaccine status: Completed vaccines  Qualifies for Shingles Vaccine? Yes   Zostavax completed No   Shingrix Completed?: No.    Education has been provided regarding the importance of this vaccine. Patient has been advised to call insurance company to determine out  of pocket expense if they have not yet received this vaccine. Advised may also receive vaccine at local pharmacy or Health Dept. Verbalized acceptance and understanding.  Screening Tests Health Maintenance  Topic Date Due   Zoster Vaccines- Shingrix (1 of 2) Never done   COVID-19 Vaccine (5 -  2023-24 season) 11/21/2022   INFLUENZA VACCINE  07/30/2023   Medicare Annual Wellness (AWV)  05/21/2024   DTaP/Tdap/Td (2 - Td or Tdap) 07/10/2024   Pneumonia Vaccine 70+ Years old  Completed   DEXA SCAN  Completed   HPV VACCINES  Aged Out    Health Maintenance  Health Maintenance Due  Topic Date Due   Zoster Vaccines- Shingrix (1 of 2) Never done   COVID-19 Vaccine (5 - 2023-24 season) 11/21/2022    Colorectal cancer screening: No longer required.   Mammogram status: No longer required due to age.  Lung Cancer Screening: (Low Dose CT Chest recommended if Age 60-80 years, 30 pack-year currently smoking OR have quit w/in 15years.) does not qualify.   Additional Screening:  Hepatitis C Screening: does not qualify  Vision Screening: Recommended annual ophthalmology exams for early detection of glaucoma and other disorders of the eye. Is the patient up to date with their annual eye exam?  Yes  Who is the provider or what is the name of the office in which the patient attends annual eye exams? [facility] Dr Jearl Klinefelter  Dental Screening: Recommended annual dental exams for proper oral hygiene  Community Resource Referral / Chronic Care Management: CRR required this visit?  No   CCM required this visit?  No      Plan:     I have personally reviewed and noted the following in the patient's chart:   Medical and social history Use of alcohol, tobacco or illicit drugs  Current medications and supplements including opioid prescriptions. Patient is not currently taking opioid prescriptions. Functional ability and status Nutritional status Physical activity Advanced directives List of other physicians Hospitalizations, surgeries, and ER visits in previous 12 months Vitals Screenings to include cognitive, depression, and falls Referrals and appointments  In addition, I have reviewed and discussed with patient certain preventive protocols, quality metrics, and best practice  recommendations. A written personalized care plan for preventive services as well as general preventive health recommendations were provided to patient.     Mariel Sleet, CMA   05/22/2023   Nurse Notes: None.

## 2023-05-22 NOTE — Assessment & Plan Note (Signed)
Stable exam with well controlled BP.  Currently taking amlodipine. Tolerating medications without concerns or side effects. Will continue to recommend low sodium diet and current regimen.  

## 2023-09-07 ENCOUNTER — Other Ambulatory Visit: Payer: Self-pay | Admitting: Internal Medicine

## 2023-09-07 DIAGNOSIS — E782 Mixed hyperlipidemia: Secondary | ICD-10-CM

## 2023-09-30 ENCOUNTER — Other Ambulatory Visit: Payer: Self-pay

## 2023-09-30 ENCOUNTER — Emergency Department: Payer: Medicare Other

## 2023-09-30 ENCOUNTER — Encounter: Payer: Self-pay | Admitting: Emergency Medicine

## 2023-09-30 ENCOUNTER — Inpatient Hospital Stay
Admission: EM | Admit: 2023-09-30 | Discharge: 2023-10-08 | DRG: 329 | Disposition: A | Discharge status: Home / Self Care | Payer: Medicare Other | Attending: Internal Medicine | Admitting: Internal Medicine

## 2023-09-30 DIAGNOSIS — K5730 Diverticulosis of large intestine without perforation or abscess without bleeding: Secondary | ICD-10-CM | POA: Diagnosis present

## 2023-09-30 DIAGNOSIS — K5652 Intestinal adhesions [bands] with complete obstruction: Principal | ICD-10-CM | POA: Diagnosis present

## 2023-09-30 DIAGNOSIS — Z90710 Acquired absence of both cervix and uterus: Secondary | ICD-10-CM | POA: Diagnosis not present

## 2023-09-30 DIAGNOSIS — H5461 Unqualified visual loss, right eye, normal vision left eye: Secondary | ICD-10-CM | POA: Diagnosis present

## 2023-09-30 DIAGNOSIS — E876 Hypokalemia: Secondary | ICD-10-CM | POA: Diagnosis present

## 2023-09-30 DIAGNOSIS — K56609 Unspecified intestinal obstruction, unspecified as to partial versus complete obstruction: Secondary | ICD-10-CM | POA: Diagnosis not present

## 2023-09-30 DIAGNOSIS — K219 Gastro-esophageal reflux disease without esophagitis: Secondary | ICD-10-CM | POA: Diagnosis present

## 2023-09-30 DIAGNOSIS — I10 Essential (primary) hypertension: Secondary | ICD-10-CM | POA: Diagnosis present

## 2023-09-30 DIAGNOSIS — D72829 Elevated white blood cell count, unspecified: Secondary | ICD-10-CM | POA: Diagnosis present

## 2023-09-30 DIAGNOSIS — Z79899 Other long term (current) drug therapy: Secondary | ICD-10-CM

## 2023-09-30 DIAGNOSIS — Z820 Family history of epilepsy and other diseases of the nervous system: Secondary | ICD-10-CM | POA: Diagnosis not present

## 2023-09-30 DIAGNOSIS — Z7982 Long term (current) use of aspirin: Secondary | ICD-10-CM

## 2023-09-30 DIAGNOSIS — K55019 Acute (reversible) ischemia of small intestine, extent unspecified: Secondary | ICD-10-CM | POA: Diagnosis present

## 2023-09-30 DIAGNOSIS — Z8249 Family history of ischemic heart disease and other diseases of the circulatory system: Secondary | ICD-10-CM | POA: Diagnosis not present

## 2023-09-30 DIAGNOSIS — E162 Hypoglycemia, unspecified: Secondary | ICD-10-CM | POA: Diagnosis not present

## 2023-09-30 DIAGNOSIS — K55029 Acute infarction of small intestine, extent unspecified: Secondary | ICD-10-CM | POA: Diagnosis present

## 2023-09-30 DIAGNOSIS — E785 Hyperlipidemia, unspecified: Secondary | ICD-10-CM | POA: Diagnosis present

## 2023-09-30 DIAGNOSIS — K565 Intestinal adhesions [bands], unspecified as to partial versus complete obstruction: Secondary | ICD-10-CM | POA: Diagnosis present

## 2023-09-30 DIAGNOSIS — Z87891 Personal history of nicotine dependence: Secondary | ICD-10-CM | POA: Diagnosis not present

## 2023-09-30 HISTORY — DX: Unspecified intestinal obstruction, unspecified as to partial versus complete obstruction: K56.609

## 2023-09-30 LAB — CBC
HCT: 41 % (ref 36.0–46.0)
Hemoglobin: 12.4 g/dL (ref 12.0–15.0)
MCH: 25.4 pg — ABNORMAL LOW (ref 26.0–34.0)
MCHC: 30.2 g/dL (ref 30.0–36.0)
MCV: 84 fL (ref 80.0–100.0)
Platelets: 318 10*3/uL (ref 150–400)
RBC: 4.88 MIL/uL (ref 3.87–5.11)
RDW: 16.3 % — ABNORMAL HIGH (ref 11.5–15.5)
WBC: 16.4 10*3/uL — ABNORMAL HIGH (ref 4.0–10.5)
nRBC: 0 % (ref 0.0–0.2)

## 2023-09-30 LAB — URINALYSIS, ROUTINE W REFLEX MICROSCOPIC
Bilirubin Urine: NEGATIVE
Glucose, UA: NEGATIVE mg/dL
Hgb urine dipstick: NEGATIVE
Ketones, ur: 5 mg/dL — AB
Leukocytes,Ua: NEGATIVE
Nitrite: NEGATIVE
Protein, ur: NEGATIVE mg/dL
Specific Gravity, Urine: 1.027 (ref 1.005–1.030)
pH: 8 (ref 5.0–8.0)

## 2023-09-30 LAB — COMPREHENSIVE METABOLIC PANEL
ALT: 12 U/L (ref 0–44)
AST: 20 U/L (ref 15–41)
Albumin: 3.7 g/dL (ref 3.5–5.0)
Alkaline Phosphatase: 59 U/L (ref 38–126)
Anion gap: 9 (ref 5–15)
BUN: 16 mg/dL (ref 8–23)
CO2: 25 mmol/L (ref 22–32)
Calcium: 8.8 mg/dL — ABNORMAL LOW (ref 8.9–10.3)
Chloride: 106 mmol/L (ref 98–111)
Creatinine, Ser: 0.77 mg/dL (ref 0.44–1.00)
GFR, Estimated: >60 mL/min (ref >60)
Glucose, Bld: 121 mg/dL — ABNORMAL HIGH (ref 70–99)
Potassium: 3.4 mmol/L — ABNORMAL LOW (ref 3.5–5.1)
Sodium: 140 mmol/L (ref 135–145)
Total Bilirubin: 0.8 mg/dL (ref 0.3–1.2)
Total Protein: 7.5 g/dL (ref 6.5–8.1)

## 2023-09-30 LAB — TYPE AND SCREEN
ABO/RH(D): O POS
Antibody Screen: NEGATIVE

## 2023-09-30 LAB — MAGNESIUM: Magnesium: 2.3 mg/dL (ref 1.7–2.4)

## 2023-09-30 LAB — LIPASE, BLOOD: Lipase: 21 U/L (ref 11–51)

## 2023-09-30 LAB — LACTIC ACID, PLASMA: Lactic Acid, Venous: 1.5 mmol/L (ref 0.5–1.9)

## 2023-09-30 LAB — PROCALCITONIN: Procalcitonin: <0.1 ng/mL

## 2023-09-30 MED ORDER — MORPHINE SULFATE (PF) 4 MG/ML IV SOLN
4.0000 mg | Freq: Once | INTRAVENOUS | Status: AC
  Administered 2023-09-30: 4 mg via INTRAVENOUS
  Filled 2023-09-30: qty 1

## 2023-09-30 MED ORDER — POTASSIUM CHLORIDE 10 MEQ/100ML IV SOLN
10.0000 meq | INTRAVENOUS | Status: AC
  Administered 2023-09-30: 10 meq via INTRAVENOUS
  Filled 2023-09-30 (×2): qty 100

## 2023-09-30 MED ORDER — SODIUM CHLORIDE 0.9 % IV SOLN: INTRAVENOUS | Status: DC

## 2023-09-30 MED ORDER — FENTANYL CITRATE PF 50 MCG/ML IJ SOSY
25.0000 ug | PREFILLED_SYRINGE | INTRAMUSCULAR | Status: DC | PRN
  Filled 2023-09-30: qty 1

## 2023-09-30 MED ORDER — ONDANSETRON HCL 4 MG/2ML IJ SOLN
4.0000 mg | Freq: Four times a day (QID) | INTRAMUSCULAR | Status: DC | PRN
  Administered 2023-10-02: 4 mg via INTRAVENOUS
  Filled 2023-09-30: qty 2

## 2023-09-30 MED ORDER — ONDANSETRON HCL 4 MG/2ML IJ SOLN
4.0000 mg | Freq: Once | INTRAMUSCULAR | Status: AC
  Administered 2023-09-30: 4 mg via INTRAVENOUS
  Filled 2023-09-30: qty 2

## 2023-09-30 MED ORDER — ACETAMINOPHEN 325 MG PO TABS: 650.0000 mg | ORAL_TABLET | Freq: Four times a day (QID) | ORAL | Status: DC | PRN

## 2023-09-30 MED ORDER — ONDANSETRON HCL 4 MG/2ML IJ SOLN
4.0000 mg | Freq: Three times a day (TID) | INTRAMUSCULAR | Status: DC | PRN
  Administered 2023-09-30: 4 mg via INTRAVENOUS
  Filled 2023-09-30: qty 2

## 2023-09-30 MED ORDER — OXYCODONE-ACETAMINOPHEN 5-325 MG PO TABS
1.0000 | ORAL_TABLET | ORAL | Status: DC | PRN
  Administered 2023-09-30: 1 via ORAL
  Filled 2023-09-30: qty 1

## 2023-09-30 MED ORDER — SIMVASTATIN 20 MG PO TABS
10.0000 mg | ORAL_TABLET | Freq: Every day | ORAL | Status: DC
  Administered 2023-09-30: 10 mg via ORAL
  Filled 2023-09-30: qty 1

## 2023-09-30 MED ORDER — AMLODIPINE BESYLATE 5 MG PO TABS
5.0000 mg | ORAL_TABLET | Freq: Every day | ORAL | Status: DC
  Filled 2023-09-30: qty 1

## 2023-09-30 MED ORDER — SODIUM CHLORIDE 0.9 % IV BOLUS
1000.0000 mL | Freq: Once | INTRAVENOUS | Status: AC
  Administered 2023-09-30: 1000 mL via INTRAVENOUS

## 2023-09-30 MED ORDER — ACETAMINOPHEN 650 MG RE SUPP: 650.0000 mg | Freq: Four times a day (QID) | RECTAL | Status: DC | PRN

## 2023-09-30 MED ORDER — HYDRALAZINE HCL 20 MG/ML IJ SOLN: 5.0000 mg | INTRAMUSCULAR | Status: DC | PRN

## 2023-09-30 MED ORDER — MORPHINE SULFATE (PF) 2 MG/ML IV SOLN
2.0000 mg | INTRAVENOUS | Status: DC | PRN
  Administered 2023-09-30: 2 mg via INTRAVENOUS
  Filled 2023-09-30: qty 1

## 2023-09-30 MED ORDER — IOHEXOL 300 MG/ML  SOLN
100.0000 mL | Freq: Once | INTRAMUSCULAR | Status: AC | PRN
  Administered 2023-09-30: 100 mL via INTRAVENOUS

## 2023-09-30 NOTE — ED Notes (Addendum)
First Nurse note:  Arrived via EMS. Pt is not able to have a BM in several days. Pt endorses N/V. Pt is actively putting her finger down her throat to gag herself.  VS WNL by EMS

## 2023-09-30 NOTE — Consult Note (Signed)
Patient ID: Kristy Sellers, female   DOB: 1941/09/23, 82 y.o.   MRN: [id number] CC: N/V, abdominal pain History of Present Illness Kristy Sellers is a 82 y.o. female who presents to the ED with 3-day history of constipation.  The patient says that she was at a party on Saturday and after that had some diarrhea but that was her last bowel movement.  Today she felt the urge to defecate but when she was straining she developed some abdominal pain as well as nausea.  The abdominal pain was described as sharp and throughout her abdomen and she started to have emesis.  She also reports that she has not passed any gas today.  Due to the pain and nausea she came to the emergency department for evaluation.  While in the lobby she had another episode of emesis.  Her workup in the ED has included labs that showed a leukocytosis of 16,000 and a CT scan that showed dilated bowel and concern for a closed-loop obstruction.  On my exam she reports that her pain is much better and she denies any nausea.  Her surgical history is significant for an abdominal hysterectomy and she denies any previous episodes of obstruction or nausea episodes like this.  Past Medical History Past Medical History:  Diagnosis Date   Detached retina 05/18/2016   Overview:   S/p RRD Repair OD with retained S.O.   GERD (gastroesophageal reflux disease)    History of detached retina repair    Hyperlipidemia    Hypertension    Mood disorder (HCC) 03/12/2021      Past Surgical History:  Procedure Laterality Date   ABDOMINAL HYSTERECTOMY     COLONOSCOPY  ~2006   normal   RETINAL DETACHMENT SURGERY Right     No Known Allergies  No current facility-administered medications for this encounter.   Current Outpatient Medications  Medication Sig Dispense Refill   amLODipine (NORVASC) 5 MG tablet TAKE 1 TABLET (5 MG TOTAL) BY MOUTH DAILY. 90 tablet 3   aspirin 81 MG tablet Take 81 mg by mouth daily.     atropine 1 % ophthalmic solution PLACE  1 DROP INTO THE RIGHT EYE ONCE DAILY  11   prednisoLONE acetate (PRED FORTE) 1 % ophthalmic suspension      simvastatin (ZOCOR) 10 MG tablet TAKE 1 TABLET BY MOUTH EVERY DAY 90 tablet 3    Family History Family History  Problem Relation Age of Onset   Parkinson's disease Mother    CAD Father    CAD Son        16   Breast cancer Neg Hx        Social History Social History   Tobacco Use   Smoking status: Former    Current packs/day: 0.00    Average packs/day: 0.5 packs/day for 20.0 years (10.0 ttl pk-yrs)    Types: Cigarettes    Start date: 12/30/1963    Quit date: 12/30/1983    Years since quitting: 39.7   Smokeless tobacco: Never  Vaping Use   Vaping status: Never Used  Substance Use Topics   Alcohol use: No    Alcohol/week: 0.0 standard drinks of alcohol   Drug use: Never        ROS Full ROS of systems performed and is otherwise negative there than what is stated in the HPI  Physical Exam Blood pressure (!) 148/91, pulse 80, temperature 98.2 F (36.8 C), temperature source Oral, resp. rate (!) 24, weight 71.7 kg, SpO2 99%.  CONSTITUTIONAL: in no acute distress  EYES: Pupils equal, round, and reactive to light, Sclera non-icteric. EARS, NOSE, MOUTH AND THROAT: The oropharynx is clear. Hearing is intact to voice.  NECK: Trachea is midline, and there is no jugular venous distension.  RESPIRATORY: NWOB on RA CARDIOVASCULAR: Heart is regular without murmurs, gallops, or rubs. GI: Soft, tender to deep palpation throughout but no signs of peritonitis no rebound tenderness or guarding. She is slightly distended  No hernias noted. She has a normal gait and was able to ambulate to the bathroom on her own Data Reviewed Labs notable for leukocytosis of 16,000 her creatinine is normal she is hypokalemic but her chloride is within normal limits.  She does not have a lactic acidosis.  I independently reviewed her CT Scan there is dilated bowel.  There is some mesenteric edema  but no pneumatosis or portal venous gas.  I also discussed this with Dr. Chales Abrahams radiologist on-call he agrees that this is concerning but no signs of intra-abdominal catastrophe  I have personally reviewed the patient's imaging and medical records.    Assessment   82 year old female with 3-day history of constioation and 1 day history of nausea and vomiting.  CT scan with dilated loops of bowel.  However on exam she is hemodynamically normal without a lactic acidosis and is able to ambulate on her own.   Plan    Given her overall reassuring exam I think it is reasonable to decompress her and attempt a Gastrografin challenge.  She will have an NG tube placed and we will plan for Gastrografin challenge in the morning.  I discussed with the patient and her daughter that this study can be both diagnostic and therapeutic.  However, if she has any decline in her hemodynamic status, worsening pain or there is no flow of contrast into her colon after the Gastrografin study then she would need to proceed to the operating room for surgical intervention.  Please ensure that she has a CBC for the morning and recommend fluid resuscitation.  Please call with any changes in clinical status  Kandis Cocking 09/30/2023, 5:51 PM

## 2023-09-30 NOTE — ED Provider Notes (Signed)
[facility] Provider Note    Event Date/Time   First MD Initiated Contact with Patient 09/30/23 1503     (approximate)   History   Abdominal Pain and Emesis   HPI  Kristy Sellers is a 82 y.o. female  here with abdominal pain. Pt reports acute onset of initially mild, diffuse abdominal pain this morning. Around 85, it began to become increasingly severe, 10/10, and diffuse with associated nausea, emesis. Has been unable to keep anything down. Pain is more severe than anything she's felt in her abdomen before. Reports she has not had a BM in 3 days which is abnormal for her. No diarrhea. No flatulence. No fevers. No urinary sx.       Physical Exam   Triage Vital Signs: ED Triage Vitals [09/30/23 1339]  Encounter Vitals Group     BP 112/70     Systolic BP Percentile      Diastolic BP Percentile      Pulse Rate 61     Resp (!) 24     Temp 98.2 F (36.8 C)     Temp Source Oral     SpO2 99 %     Weight 158 lb 1.1 oz (71.7 kg)     Height      Head Circumference      Peak Flow      Pain Score 10     Pain Loc      Pain Education      Exclude from Growth Chart     Most recent vital signs: Vitals:   09/30/23 2215 09/30/23 2230  BP:    Pulse: 84 80  Resp:  18  Temp:    SpO2: 100%      General: Awake, no distress.  CV:  Good peripheral perfusion. RRR. Resp:  Normal work of breathing. Lungs clear. Abd:  Moderate diffuse TTP, slight guarding but no rebound. Soft. Other:  Dry MM.   ED Results / Procedures / Treatments   Labs (all labs ordered are listed, but only abnormal results are displayed) Labs Reviewed  COMPREHENSIVE METABOLIC PANEL - Abnormal; Notable for the following components:      Result Value   Potassium 3.4 (*)    Glucose, Bld 121 (*)    Calcium 8.8 (*)    All other components within normal limits  CBC - Abnormal; Notable for the following components:   WBC 16.4 (*)    MCH 25.4 (*)    RDW 16.3 (*)    All other  components within normal limits  URINALYSIS, ROUTINE W REFLEX MICROSCOPIC - Abnormal; Notable for the following components:   Color, Urine YELLOW (*)    APPearance HAZY (*)    Ketones, ur 5 (*)    All other components within normal limits  LIPASE, BLOOD  LACTIC ACID, PLASMA  MAGNESIUM  PROCALCITONIN  BASIC METABOLIC PANEL  CBC  PROTIME-INR  APTT  TYPE AND SCREEN     EKG    RADIOLOGY CT A/P: closed loop SBO with internal hernia   I also independently reviewed and agree with radiologist interpretations.   PROCEDURES:  Critical Care performed: No  .1-3 Lead EKG Interpretation  Performed by: Shaune Pollack, MD Authorized by: Shaune Pollack, MD     Interpretation: normal     ECG rate:  60-80   ECG rate assessment: normal     Rhythm: sinus rhythm     Ectopy: none     Conduction: normal  Comments:     Indication: Abdominal pain    MEDICATIONS ORDERED IN ED: Medications  0.9 %  sodium chloride infusion ( Intravenous New Bag/Given 09/30/23 2012)  morphine (PF) 2 MG/ML injection 2 mg ( Intravenous MAR Hold 10/01/23 0051)  potassium chloride 10 mEq in 100 mL IVPB (10 mEq Intravenous Not Given 09/30/23 2143)  acetaminophen (TYLENOL) tablet 650 mg ( Oral MAR Hold 10/01/23 0051)  acetaminophen (TYLENOL) suppository 650 mg ( Rectal MAR Hold 10/01/23 0051)  hydrALAZINE (APRESOLINE) injection 5 mg ( Intravenous MAR Hold 10/01/23 0051)  oxyCODONE-acetaminophen (PERCOCET/ROXICET) 5-325 MG per tablet 1 tablet ( Oral MAR Hold 10/01/23 0051)  amLODipine (NORVASC) tablet 5 mg ( Oral Automatically Held 10/09/23 1000)  simvastatin (ZOCOR) tablet 10 mg ( Oral Automatically Held 10/16/23 2200)  ondansetron (ZOFRAN) injection 4 mg ( Intravenous MAR Hold 10/01/23 0051)  fentaNYL (SUBLIMAZE) injection 25 mcg ( Intravenous MAR Hold 10/01/23 0051)  cefoTEtan (CEFOTAN) 1 g in sodium chloride 0.9 % 100 mL IVPB ( Intravenous MAR Hold 10/01/23 0051)  sodium chloride 0.9 % bolus 1,000 mL (0 mLs  Intravenous Stopped 09/30/23 1813)  morphine (PF) 4 MG/ML injection 4 mg (4 mg Intravenous Given 09/30/23 1525)  ondansetron (ZOFRAN) injection 4 mg (4 mg Intravenous Given 09/30/23 1527)  iohexol (OMNIPAQUE) 300 MG/ML solution 100 mL (100 mLs Intravenous Contrast Given 09/30/23 1533)     IMPRESSION / MDM / ASSESSMENT AND PLAN / ED COURSE  I reviewed the triage vital signs and the nursing notes.                              Differential diagnosis includes, but is not limited to, SBO, diverticulitis, cholecystitis, enteritis, PNA, PTX, ACS, ileus, fecal impaction  Patient's presentation is most consistent with acute presentation with potential threat to life or bodily function.  The patient is on the cardiac monitor to evaluate for evidence of arrhythmia and/or significant heart rate changes  82 yo F here with abdominal pain and distension. Pt very uncomfortable on arrival. Labs show leukocytosis, mild dehydration but o/w unremarkable. LA is normal. CT scan obtained, reviewed, and shows likely closed loop SBO, possible internal hernia. Surgery consulted, will place NGT. Admit to medicine.   FINAL CLINICAL IMPRESSION(S) / ED DIAGNOSES   Final diagnoses:  Small bowel obstruction (HCC)     Rx / DC Orders   ED Discharge Orders     None        Note:  This document was prepared using Dragon voice recognition software and may include unintentional dictation errors.   Shaune Pollack, MD 10/01/23 [phone]

## 2023-09-30 NOTE — ED Triage Notes (Signed)
Pt in from home via [facility] with low abdominal pain that radiates to back, emesis x 2 en route. Pain began at 0900 this am, described as "gas pain". Pt states last BM x 3 days, has not taken any laxatives.

## 2023-09-30 NOTE — ED Notes (Signed)
When RN went to give fentanyl to pt, pt refused pain medicine. Fentanyl wasted in pyxsis

## 2023-09-30 NOTE — ED Notes (Signed)
Pt attempted to give urine sample, unable to do so at this time.

## 2023-09-30 NOTE — H&P (Signed)
History and Physical    Laree Garron ZOX:[id number] DOB: 1941-11-04 DOA: 09/30/2023  Referring MD/NP/PA:   PCP: Reubin Milan, MD   Patient coming from:  The patient is coming from home.     Chief Complaint: Nausea, vomiting, abdominal pain  HPI: Kristy Sellers is a 82 y.o. female with medical history significant of hypertension, hyperlipidemia, GERD, detached retina with right eye blindness, C. difficile colitis, mood disorder, who presents with nausea, vomiting, abdominal pain.  Patient states that she has nausea, vomiting, abdominal pain for more than 4 days.  She has several episodes of nonbilious nonbloody vomiting.  The abdominal pain is mainly located in the left lower quadrant, constant, sharp, severe, radiating to the back, intermittently accentuated, not aggravated or alleviated by any known factors.  She has been unable to keep anything down.  She has been constipated, last bowel movement was 4 days ago. Patient states that denies fever or chills.  Patient does not have chest pain, cough, shortness of breath.  No symptoms of UTI.   Data reviewed independently and ED Course: pt was found to have WBC 16.4, GFR> 60, lipase 21, potassium 3.4, negative urinalysis, lactic acid of 1.5.  Temperature normal, blood pressure 129/88, heart rate 87, RR 24, oxygen saturation 99% on room air.  CT scan showed small bowel obstruction.  Patient is admitted to MedSurg bed as inpatient.  Dr. Maurine Minister of surgery is consulted.   CT scan of abdomen/pelvis Small bowel obstruction in left lower quadrant, with features suspicious for a closed loop obstruction possibly due to internal hernia. Surgical consultation is recommended.   Severe diffuse colonic diverticulosis, without radiographic evidence of diverticulitis.   EKG: I have personally reviewed.  Not done in ED, will get one.   ***   Review of Systems:   General: no fevers, chills, no body weight gain, has poor appetite, has  fatigue HEENT: no blurry vision, hearing changes or sore throat Respiratory: no dyspnea, coughing, wheezing CV: no chest pain, no palpitations GI: has nausea, vomiting, abdominal pain, constipation GU: no dysuria, burning on urination, increased urinary frequency, hematuria  Ext: no leg edema Neuro: no unilateral weakness, numbness, or tingling, no vision change or hearing loss Skin: no rash, no skin tear. MSK: No muscle spasm, no deformity, no limitation of range of movement in spin Heme: No easy bruising.  Travel history: No recent long distant travel.   Allergy: No Known Allergies  Past Medical History:  Diagnosis Date   Detached retina 05/18/2016   Overview:   S/p RRD Repair OD with retained S.O.   GERD (gastroesophageal reflux disease)    History of detached retina repair    Hyperlipidemia    Hypertension    Mood disorder (HCC) 03/12/2021    Past Surgical History:  Procedure Laterality Date   ABDOMINAL HYSTERECTOMY     COLONOSCOPY  ~2006   normal   RETINAL DETACHMENT SURGERY Right     Social History:  reports that she quit smoking about 39 years ago. Her smoking use included cigarettes. She started smoking about 59 years ago. She has a 10 pack-year smoking history. She has never used smokeless tobacco. She reports that she does not drink alcohol and does not use drugs.  Family History:  Family History  Problem Relation Age of Onset   Parkinson's disease Mother    CAD Father    CAD Son        48   Breast cancer Neg Hx  Prior to Admission medications   Medication Sig Start Date End Date Taking? Authorizing Provider  amLODipine (NORVASC) 5 MG tablet TAKE 1 TABLET (5 MG TOTAL) BY MOUTH DAILY. 05/06/23  Yes Reubin Milan, MD  aspirin 81 MG tablet Take 81 mg by mouth at bedtime as needed.   Yes [provider]  simvastatin (ZOCOR) 10 MG tablet TAKE 1 TABLET BY MOUTH EVERY DAY Patient taking differently: Take 10 mg by mouth at bedtime. 09/07/23  Yes  Reubin Milan, MD  atropine 1 % ophthalmic solution PLACE 1 DROP INTO THE RIGHT EYE ONCE DAILY Patient not taking: Reported on 09/30/2023 04/07/18   [provider]  prednisoLONE acetate (PRED FORTE) 1 % ophthalmic suspension  03/24/19   [provider]    Physical Exam: Vitals:   09/30/23 1339 09/30/23 1531 09/30/23 1739 09/30/23 1800  BP: 112/70 (!) 148/91  129/88  Pulse: 61  80 87  Resp: (!) 24     Temp: 98.2 F (36.8 C)     TempSrc: Oral     SpO2: 99%  99% 100%  Weight: 71.7 kg      General: Not in acute distress HEENT:       Eyes: left eye PERRL, EOMI, no jaundice.  Has right eye blindness       ENT: No discharge from the ears and nose, no pharynx injection, no tonsillar enlargement.        Neck: No JVD, no bruit, no mass felt. Heme: No neck lymph node enlargement. Cardiac: S1/S2, RRR, No murmurs, No gallops or rubs. Respiratory: No rales, wheezing, rhonchi or rubs. GI: Soft, nondistended, has tenderness in LLQ, no rebound pain, no organomegaly, BS present. GU: No hematuria Ext: No pitting leg edema bilaterally. 1+DP/PT pulse bilaterally. Musculoskeletal: No joint deformities, No joint redness or warmth, no limitation of ROM in spin. Skin: No rashes.  Neuro: Alert, oriented X3, cranial nerves II-XII grossly intact, moves all extremities normally.  Psych: Patient is not psychotic, no suicidal or hemocidal ideation.  Labs on Admission: I have personally reviewed following labs and imaging studies  CBC: Recent Labs  Lab 09/30/23 1345  WBC 16.4*  HGB 12.4  HCT 41.0  MCV 84.0  PLT 318   Basic Metabolic Panel: Recent Labs  Lab 09/30/23 1345  NA 140  K 3.4*  CL 106  CO2 25  GLUCOSE 121*  BUN 16  CREATININE 0.77  CALCIUM 8.8*   GFR: Estimated Creatinine Clearance: 53.8 mL/min (by C-G formula based on SCr of 0.77 mg/dL). Liver Function Tests: Recent Labs  Lab 09/30/23 1345  AST 20  ALT 12  ALKPHOS 59  BILITOT 0.8  PROT 7.5  ALBUMIN  3.7   Recent Labs  Lab 09/30/23 1345  LIPASE 21   No results for input(s): "AMMONIA" in the last 168 hours. Coagulation Profile: No results for input(s): "INR", "PROTIME" in the last 168 hours. Cardiac Enzymes: No results for input(s): "CKTOTAL", "CKMB", "CKMBINDEX", "TROPONINI" in the last 168 hours. BNP (last 3 results) No results for input(s): "PROBNP" in the last 8760 hours. HbA1C: No results for input(s): "HGBA1C" in the last 72 hours. CBG: No results for input(s): "GLUCAP" in the last 168 hours. Lipid Profile: No results for input(s): "CHOL", "HDL", "LDLCALC", "TRIG", "CHOLHDL", "LDLDIRECT" in the last 72 hours. Thyroid Function Tests: No results for input(s): "TSH", "T4TOTAL", "FREET4", "T3FREE", "THYROIDAB" in the last 72 hours. Anemia Panel: No results for input(s): "VITAMINB12", "FOLATE", "FERRITIN", "TIBC", "IRON", "RETICCTPCT" in the last 72  hours. Urine analysis:    Component Value Date/Time   COLORURINE YELLOW (A) 09/30/2023 1345   APPEARANCEUR HAZY (A) 09/30/2023 1345   APPEARANCEUR CLEAR 01/09/2015 0938   LABSPEC 1.027 09/30/2023 1345   LABSPEC 1.010 01/09/2015 0938   PHURINE 8.0 09/30/2023 1345   GLUCOSEU NEGATIVE 09/30/2023 1345   GLUCOSEU NEGATIVE 01/09/2015 0938   HGBUR NEGATIVE 09/30/2023 1345   BILIRUBINUR NEGATIVE 09/30/2023 1345   BILIRUBINUR neg 11/03/2022 0927   BILIRUBINUR NEGATIVE 01/09/2015 0938   KETONESUR 5 (A) 09/30/2023 1345   PROTEINUR NEGATIVE 09/30/2023 1345   UROBILINOGEN 0.2 11/03/2022 0927   NITRITE NEGATIVE 09/30/2023 1345   LEUKOCYTESUR NEGATIVE 09/30/2023 1345   LEUKOCYTESUR MODERATE 01/09/2015 0938   Sepsis Labs: @LABRCNTIP (procalcitonin:4,lacticidven:4) )No results found for this or any previous visit (from the past 240 hour(s)).   Radiological Exams on Admission: CT ABDOMEN PELVIS W CONTRAST  Result Date: 09/30/2023 CLINICAL DATA:  Abdominal pain, nausea, and vomiting for several days. Constipation. EXAM: CT ABDOMEN AND  PELVIS WITH CONTRAST TECHNIQUE: Multidetector CT imaging of the abdomen and pelvis was performed using the standard protocol following bolus administration of intravenous contrast. RADIATION DOSE REDUCTION: This exam was performed according to the departmental dose-optimization program which includes automated exposure control, adjustment of the mA and/or kV according to patient size and/or use of iterative reconstruction technique. CONTRAST:  OMNIPAQUE IOHEXOL 300 MG/ML  SOLN COMPARISON:  09/06/2015 FINDINGS: Lower Chest: No acute findings. Hepatobiliary: Several benign-appearing hepatic cysts are again noted. No suspicious hepatic masses identified. Gallbladder is unremarkable. No evidence of biliary ductal dilatation. Pancreas:  No mass or inflammatory changes. Spleen: Within normal limits in size and appearance. Adrenals/Urinary Tract: No suspicious masses identified. No evidence of ureteral calculi or hydronephrosis. Stomach/Bowel: Several fluid-filled moderately dilated small bowel loops are seen which are localized to the left lower quadrant, with a swirling or twisting pattern in the central mesentery. This is consistent with a small bowel obstruction, and is suspicious for a closed loop obstruction possibly due to internal hernia. No evidence of bowel wall thickening or pneumatosis. No evidence of free intraperitoneal air or abscess. Diffuse severe colonic diverticulosis is noted, however, there is no evidence of diverticulitis. Normal appendix visualized. Vascular/Lymphatic: No pathologically enlarged lymph nodes. No acute vascular findings. Reproductive: Prior hysterectomy noted. Adnexal regions are unremarkable in appearance. Other:  None. Musculoskeletal:  No suspicious bone lesions identified. IMPRESSION: Small bowel obstruction in left lower quadrant, with features suspicious for a closed loop obstruction possibly due to internal hernia. Surgical consultation is recommended. Severe diffuse  colonic diverticulosis, without radiographic evidence of diverticulitis. Electronically Signed   By: Danae Orleans M.D.   On: 09/30/2023 16:00      Assessment/Plan Principal Problem:   SBO (small bowel obstruction) (HCC) Active Problems:   Leucocytosis   Hypokalemia   Essential hypertension   HLD (hyperlipidemia)   Assessment and Plan:   Principal Problem:   SBO (small bowel obstruction) (HCC) Active Problems:   Leucocytosis   Hypokalemia   Essential hypertension   HLD (hyperlipidemia)    DVT ppx: SCD  Code Status: Full code   Family Communication:   Yes, patient's 2 daughters and grandson    at bed side.  Disposition Plan:  Anticipate discharge back to previous environment  Consults called:   Dr. Maurine Minister of surgery is consulted.  Admission status and Level of care: Med-Surg:    as inpt     Dispo: The patient is from: Home  Anticipated d/c is to: Home              Anticipated d/c date is: 2 days              Patient currently is not medically stable to d/c.    Severity of Illness:  The appropriate patient status for this patient is INPATIENT. Inpatient status is judged to be reasonable and necessary in order to provide the required intensity of service to ensure the patient's safety. The patient's presenting symptoms, physical exam findings, and initial radiographic and laboratory data in the context of their chronic comorbidities is felt to place them at high risk for further clinical deterioration. Furthermore, it is not anticipated that the patient will be medically stable for discharge from the hospital within 2 midnights of admission.   * I certify that at the point of admission it is my clinical judgment that the patient will require inpatient hospital care spanning beyond 2 midnights from the point of admission due to high intensity of service, high risk for further deterioration and high frequency of surveillance required.*       Date of  Service 09/30/2023    Lorretta Harp [facility]   If 7PM-7AM, please contact night-coverage [email or web address] 09/30/2023, 6:54 PM

## 2023-10-01 ENCOUNTER — Inpatient Hospital Stay: Payer: Medicare Other | Admitting: Anesthesiology

## 2023-10-01 ENCOUNTER — Encounter
Admission: EM | Disposition: A | Discharge status: Home / Self Care | Payer: Self-pay | Source: Home / Self Care | Attending: Osteopathic Medicine

## 2023-10-01 ENCOUNTER — Other Ambulatory Visit: Payer: Self-pay

## 2023-10-01 DIAGNOSIS — K56609 Unspecified intestinal obstruction, unspecified as to partial versus complete obstruction: Secondary | ICD-10-CM | POA: Diagnosis not present

## 2023-10-01 HISTORY — PX: BOWEL RESECTION: SHX1257

## 2023-10-01 HISTORY — PX: LYSIS OF ADHESION: SHX5961

## 2023-10-01 HISTORY — PX: LAPAROTOMY: SHX154

## 2023-10-01 LAB — CBC
HCT: 40.3 % (ref 36.0–46.0)
Hemoglobin: 12.7 g/dL (ref 12.0–15.0)
MCH: 25.5 pg — ABNORMAL LOW (ref 26.0–34.0)
MCHC: 31.5 g/dL (ref 30.0–36.0)
MCV: 80.8 fL (ref 80.0–100.0)
Platelets: 279 10*3/uL (ref 150–400)
RBC: 4.99 MIL/uL (ref 3.87–5.11)
RDW: 16.6 % — ABNORMAL HIGH (ref 11.5–15.5)
WBC: 13.3 10*3/uL — ABNORMAL HIGH (ref 4.0–10.5)
nRBC: 0 % (ref 0.0–0.2)

## 2023-10-01 LAB — BASIC METABOLIC PANEL
Anion gap: 9 (ref 5–15)
BUN: 13 mg/dL (ref 8–23)
CO2: 24 mmol/L (ref 22–32)
Calcium: 8.3 mg/dL — ABNORMAL LOW (ref 8.9–10.3)
Chloride: 109 mmol/L (ref 98–111)
Creatinine, Ser: 0.75 mg/dL (ref 0.44–1.00)
GFR, Estimated: >60 mL/min (ref >60)
Glucose, Bld: 146 mg/dL — ABNORMAL HIGH (ref 70–99)
Potassium: 3.4 mmol/L — ABNORMAL LOW (ref 3.5–5.1)
Sodium: 142 mmol/L (ref 135–145)

## 2023-10-01 LAB — GLUCOSE, CAPILLARY
Glucose-Capillary: 134 mg/dL — ABNORMAL HIGH (ref 70–99)
Glucose-Capillary: 147 mg/dL — ABNORMAL HIGH (ref 70–99)

## 2023-10-01 SURGERY — LAPAROTOMY, EXPLORATORY
Anesthesia: General | Site: Abdomen

## 2023-10-01 MED ORDER — DEXAMETHASONE SODIUM PHOSPHATE 10 MG/ML IJ SOLN
INTRAMUSCULAR | Status: DC | PRN
  Administered 2023-10-01: 5 mg via INTRAVENOUS

## 2023-10-01 MED ORDER — ACETAMINOPHEN 10 MG/ML IV SOLN
INTRAVENOUS | Status: DC | PRN
  Administered 2023-10-01: 1000 mg via INTRAVENOUS

## 2023-10-01 MED ORDER — SUCCINYLCHOLINE CHLORIDE 200 MG/10ML IV SOSY
PREFILLED_SYRINGE | INTRAVENOUS | Status: AC
  Filled 2023-10-01: qty 10

## 2023-10-01 MED ORDER — ACETAMINOPHEN 10 MG/ML IV SOLN
INTRAVENOUS | Status: AC
  Filled 2023-10-01: qty 100

## 2023-10-01 MED ORDER — OXYCODONE HCL 5 MG/5ML PO SOLN: 5.0000 mg | Freq: Once | ORAL | Status: DC | PRN

## 2023-10-01 MED ORDER — FENTANYL CITRATE (PF) 100 MCG/2ML IJ SOLN: 25.0000 ug | INTRAMUSCULAR | Status: DC | PRN

## 2023-10-01 MED ORDER — SUCCINYLCHOLINE CHLORIDE 200 MG/10ML IV SOSY
PREFILLED_SYRINGE | INTRAVENOUS | Status: DC | PRN
  Administered 2023-10-01: 120 mg via INTRAVENOUS

## 2023-10-01 MED ORDER — ACETAMINOPHEN 10 MG/ML IV SOLN
1000.0000 mg | Freq: Three times a day (TID) | INTRAVENOUS | Status: AC
  Administered 2023-10-01 (×3): 1000 mg via INTRAVENOUS
  Filled 2023-10-01 (×3): qty 100

## 2023-10-01 MED ORDER — SODIUM CHLORIDE 0.9 % IV SOLN
1.0000 g | Freq: Once | INTRAVENOUS | Status: AC
  Administered 2023-10-01: 2 g via INTRAVENOUS
  Filled 2023-10-01: qty 1

## 2023-10-01 MED ORDER — PREDNISOLONE ACETATE 1 % OP SUSP
1.0000 [drp] | Freq: Every day | OPHTHALMIC | Status: DC
  Administered 2023-10-01 – 2023-10-08 (×8): 1 [drp] via OPHTHALMIC
  Filled 2023-10-01: qty 1

## 2023-10-01 MED ORDER — ROCURONIUM BROMIDE 100 MG/10ML IV SOLN
INTRAVENOUS | Status: DC | PRN
  Administered 2023-10-01: 50 mg via INTRAVENOUS

## 2023-10-01 MED ORDER — ROCURONIUM BROMIDE 10 MG/ML (PF) SYRINGE
PREFILLED_SYRINGE | INTRAVENOUS | Status: AC
  Filled 2023-10-01: qty 10

## 2023-10-01 MED ORDER — BUPIVACAINE LIPOSOME 1.3 % IJ SUSP
INTRAMUSCULAR | Status: AC
  Filled 2023-10-01: qty 10

## 2023-10-01 MED ORDER — FENTANYL CITRATE (PF) 100 MCG/2ML IJ SOLN
INTRAMUSCULAR | Status: AC
  Filled 2023-10-01: qty 2

## 2023-10-01 MED ORDER — 0.9 % SODIUM CHLORIDE (POUR BTL) OPTIME
TOPICAL | Status: DC | PRN
  Administered 2023-10-01: 1000 mL

## 2023-10-01 MED ORDER — OXYCODONE HCL 5 MG PO TABS: 5.0000 mg | ORAL_TABLET | Freq: Once | ORAL | Status: DC | PRN

## 2023-10-01 MED ORDER — ATROPINE SULFATE 1 % OP SOLN
1.0000 [drp] | Freq: Every day | OPHTHALMIC | Status: DC
  Administered 2023-10-01 – 2023-10-08 (×8): 1 [drp] via OPHTHALMIC
  Filled 2023-10-01: qty 2

## 2023-10-01 MED ORDER — FENTANYL CITRATE (PF) 100 MCG/2ML IJ SOLN
INTRAMUSCULAR | Status: DC | PRN
  Administered 2023-10-01: 50 ug via INTRAVENOUS

## 2023-10-01 MED ORDER — BUPIVACAINE HCL (PF) 0.5 % IJ SOLN
INTRAMUSCULAR | Status: AC
  Filled 2023-10-01: qty 30

## 2023-10-01 MED ORDER — EPHEDRINE SULFATE-NACL 50-0.9 MG/10ML-% IV SOSY
PREFILLED_SYRINGE | INTRAVENOUS | Status: DC | PRN
  Administered 2023-10-01: 10 mg via INTRAVENOUS

## 2023-10-01 MED ORDER — ORAL CARE MOUTH RINSE: 15.0000 mL | OROMUCOSAL | Status: DC | PRN

## 2023-10-01 MED ORDER — ONDANSETRON HCL 4 MG/2ML IJ SOLN
INTRAMUSCULAR | Status: DC | PRN
  Administered 2023-10-01: 4 mg via INTRAVENOUS

## 2023-10-01 MED ORDER — BUPIVACAINE LIPOSOME 1.3 % IJ SUSP
INTRAMUSCULAR | Status: DC | PRN
  Administered 2023-10-01: 30 mL via INTRAMUSCULAR

## 2023-10-01 MED ORDER — LIDOCAINE HCL (CARDIAC) PF 100 MG/5ML IV SOSY
PREFILLED_SYRINGE | INTRAVENOUS | Status: DC | PRN
  Administered 2023-10-01: 80 mg via INTRAVENOUS

## 2023-10-01 MED ORDER — SODIUM CHLORIDE 0.9 % IV SOLN
INTRAVENOUS | Status: AC
  Filled 2023-10-01: qty 2

## 2023-10-01 MED ORDER — SUGAMMADEX SODIUM 200 MG/2ML IV SOLN
INTRAVENOUS | Status: DC | PRN
  Administered 2023-10-01: 200 mg via INTRAVENOUS

## 2023-10-01 MED ORDER — PROPOFOL 10 MG/ML IV BOLUS
INTRAVENOUS | Status: DC | PRN
  Administered 2023-10-01: 80 mg via INTRAVENOUS

## 2023-10-01 MED ORDER — PROPOFOL 10 MG/ML IV BOLUS
INTRAVENOUS | Status: AC
  Filled 2023-10-01: qty 20

## 2023-10-01 MED ORDER — LIDOCAINE HCL (PF) 2 % IJ SOLN
INTRAMUSCULAR | Status: AC
  Filled 2023-10-01: qty 5

## 2023-10-01 SURGICAL SUPPLY — 51 items
APL PRP STRL LF DISP 70% ISPRP (MISCELLANEOUS)
CHLORAPREP W/TINT 26 (MISCELLANEOUS) ×3 IMPLANT
DRAPE LAPAROTOMY 100X77 ABD (DRAPES) ×3 IMPLANT
DRSG OPSITE POSTOP 4X10 (GAUZE/BANDAGES/DRESSINGS) IMPLANT
DRSG OPSITE POSTOP 4X8 (GAUZE/BANDAGES/DRESSINGS) IMPLANT
ELECT BLADE 6.5 EXT (BLADE) ×3 IMPLANT
ELECT CAUTERY BLADE 6.4 (BLADE) ×3 IMPLANT
ELECT REM PT RETURN 9FT ADLT (ELECTROSURGICAL) ×3
ELECTRODE REM PT RTRN 9FT ADLT (ELECTROSURGICAL) ×3 IMPLANT
GLOVE BIO SURGEON STRL SZ 6.5 (GLOVE) ×3 IMPLANT
GLOVE BIO SURGEON STRL SZ7 (GLOVE) IMPLANT
GLOVE BIO SURGEON STRL SZ7.5 (GLOVE) IMPLANT
GLOVE BIOGEL PI IND STRL 6.5 (GLOVE) ×3 IMPLANT
GLOVE BIOGEL PI IND STRL 7.5 (GLOVE) IMPLANT
GLOVE BIOGEL PI IND STRL 8 (GLOVE) IMPLANT
GLOVE ECLIPSE 7.0 STRL STRAW (GLOVE) IMPLANT
GOWN STRL REUS W/ TWL LRG LVL3 (GOWN DISPOSABLE) ×6 IMPLANT
GOWN STRL REUS W/TWL LRG LVL3 (GOWN DISPOSABLE) ×6
KIT TURNOVER KIT A (KITS) ×3 IMPLANT
LABEL OR SOLS (LABEL) ×3 IMPLANT
LIGASURE IMPACT 36 18CM CVD LR (INSTRUMENTS) IMPLANT
MANIFOLD NEPTUNE II (INSTRUMENTS) ×3 IMPLANT
NDL HYPO 22X1.5 SAFETY MO (MISCELLANEOUS) ×3 IMPLANT
NEEDLE HYPO 22X1.5 SAFETY MO (MISCELLANEOUS) ×3 IMPLANT
NS IRRIG 1000ML POUR BTL (IV SOLUTION) ×3 IMPLANT
PACK BASIN MAJOR ARMC (MISCELLANEOUS) ×3 IMPLANT
PACK COLON CLEAN CLOSURE (MISCELLANEOUS) ×3 IMPLANT
RELOAD PROXIMATE 75MM BLUE (ENDOMECHANICALS) ×9 IMPLANT
RELOAD PROXIMATE TA60MM BLUE (ENDOMECHANICALS) ×6 IMPLANT
RELOAD STAPLE 60 BLU REG PROX (ENDOMECHANICALS) IMPLANT
RELOAD STAPLE 75 3.8 BLU REG (ENDOMECHANICALS) IMPLANT
RETRACTOR WND ALEXIS-O 25 LRG (MISCELLANEOUS) IMPLANT
RTRCTR WOUND ALEXIS O 25CM LRG (MISCELLANEOUS)
SPIKE FLUID TRANSFER (MISCELLANEOUS) IMPLANT
SPONGE T-LAP 18X18 ~~LOC~~+RFID (SPONGE) IMPLANT
STAPLER GUN LINEAR PROX 60 (STAPLE) IMPLANT
STAPLER PROXIMATE 75MM BLUE (STAPLE) IMPLANT
STAPLER SKIN PROX 35W (STAPLE) ×3 IMPLANT
SUT PDS AB 0 CT1 27 (SUTURE) IMPLANT
SUT SILK 2 0 (SUTURE) ×3
SUT SILK 2-0 18XBRD TIE 12 (SUTURE) IMPLANT
SUT SILK 3 0 SH CR/8 (SUTURE) IMPLANT
SUT SILK 3-0 (SUTURE) IMPLANT
SUT STRATAFIX PDS 30 CT-1 (SUTURE) IMPLANT
SUT VIC AB 3-0 SH 27 (SUTURE) ×3
SUT VIC AB 3-0 SH 27X BRD (SUTURE) ×3 IMPLANT
SUT VICRYL 3-0 CR8 SH (SUTURE) IMPLANT
SYR 20ML LL LF (SYRINGE) ×6 IMPLANT
TRAP FLUID SMOKE EVACUATOR (MISCELLANEOUS) ×3 IMPLANT
TRAY FOLEY MTR SLVR 16FR STAT (SET/KITS/TRAYS/PACK) ×3 IMPLANT
WATER STERILE IRR 500ML POUR (IV SOLUTION) ×3 IMPLANT

## 2023-10-01 NOTE — Op Note (Signed)
Operative note Preoperative diagnosis: closed-loop bowel obstruction Postoperative diagnosis: Closed-loop bowel obstruction Procedure: Exploratory laparotomy, lysis of adhesions, small bowel resection, partial omentectomy Surgeon: Maurine Minister Case type: Contaminated EBL: 30 cc   After informed consent was obtained the patient was brought to the operating room and placed supine on the operating room table.  General endotracheal anesthesia was induced and a Foley catheter was placed.  Her abdomen is then prepped and draped in the usual sterile fashion.  A surgical timeout was called identifying correct patient, site, side and procedure.  A midline incision was made and carried through the subcutaneous tissue with Bovie cautery.  The fascia was opened and the peritoneum opened bluntly.  Upon entry into the abdomen there was a small amount of free fluid but no obvious bowel perforation.  There did appear to be a ischemic segment of bowel.  This was eviscerated and it appeared that the bowel had been wrapped around an omental adhesion to the anterior abdominal wall.  This adhesion was taken down freeing the closed-loop.  A partial omentectomy was performed and part of the omentum was taken off of the anterior abdominal wall.  I then ran the bowel from the ligament of Treitz to the ileocecal valve.  The area of ischemia measured approximately 17 cm.  I did wait several minutes to see if the bowel would pink up but it remained ischemic appearing.  At that time I elected to perform a small bowel obstruction.  A window in the mesentery was made both proximally and distally where there was healthy bowel.  The small bowel was taken with GIA 60 mm stapler.  The mesentery was then taken with a LigaSure device.  The small bowel was then passed off the table as specimen.  The 2 limbs of bowel were arranged in a side-to-side functional end-to-end fashion.  The corners of the staple lines on both limbs were opened and the common  channel was made with another firing of a blue load GIA 60 mm stapler.  The common channel was then closed with a 60 mm TA stapler.  The mesenteric defect was closed with a 3-0 Vicryl.  I then reran the bowel from the ligament of Treitz to the ileocecal valve and there were no obstructing adhesions.  I did take down more omental adhesions to the anterior abdominal wall using the LigaSure device.  The abdomen was then copiously irrigated with warm saline solution and hemostasis was obtained.  The preperitoneal space was infiltrated with Marcaine liposomal bupivacaine solution.  The fascia was then closed with 0 PDS running from superior and inferior ends and tied together in the middle.  The skin was then closed with staples and then dressed with a sterile dressing.  Prior to closure of the abdomen all sponge and instrument counts were correct x 2.  The patient was then awoken from general endotracheal anesthesia and then taken to the PACU in stable condition.  Baker Pierini, M.D.

## 2023-10-01 NOTE — Transfer of Care (Signed)
Immediate Anesthesia Transfer of Care Note  Patient: Kristy Sellers  Procedure(s) Performed: EXPLORATORY LAPAROTOMY SMALL BOWEL RESECTION (Abdomen) LYSIS OF ADHESION  Patient Location: PACU  Anesthesia Type:General  Level of Consciousness: drowsy  Airway & Oxygen Therapy: Patient Spontanous Breathing and Patient connected to face mask oxygen  Post-op Assessment: Report given to RN, Post -op Vital signs reviewed and stable, and Patient moving all extremities  Post vital signs: Reviewed and stable  Last Vitals:  Vitals Value Taken Time  BP 119/60 10/01/23 0255  Temp    Pulse 65 10/01/23 0258  Resp 17 10/01/23 0258  SpO2 100 % 10/01/23 0258  Vitals shown include unfiled device data.  Last Pain:  Vitals:   09/30/23 2206  TempSrc:   PainSc: 10-Worst pain ever         Complications: No notable events documented.

## 2023-10-01 NOTE — Anesthesia Postprocedure Evaluation (Signed)
Anesthesia Post Note  Patient: Kristy Sellers  Procedure(s) Performed: EXPLORATORY LAPAROTOMY SMALL BOWEL RESECTION (Abdomen) LYSIS OF ADHESION  Patient location during evaluation: PACU Anesthesia Type: General Level of consciousness: awake and alert Pain management: pain level controlled Vital Signs Assessment: post-procedure vital signs reviewed and stable Respiratory status: spontaneous breathing, nonlabored ventilation, respiratory function stable and patient connected to nasal cannula oxygen Cardiovascular status: blood pressure returned to baseline and stable Postop Assessment: no apparent nausea or vomiting Anesthetic complications: no   No notable events documented.   Last Vitals:  Vitals:   10/01/23 0400 10/01/23 0441  BP: 128/67 137/73  Pulse: 68 71  Resp: 17 18  Temp: 36.8 C 36.9 C  SpO2: 98% 99%    Last Pain:  Vitals:   10/01/23 0545  TempSrc:   PainSc: 0-No pain                 Cleda Mccreedy [name]

## 2023-10-01 NOTE — Anesthesia Preprocedure Evaluation (Addendum)
Anesthesia Evaluation  Patient identified by MRN, date of birth, ID band Patient awake    Reviewed: Allergy & Precautions, NPO status , Patient's Chart, lab work & pertinent test results  History of Anesthesia Complications (+) PROLONGED EMERGENCE and history of anesthetic complications  Airway Mallampati: III  TM Distance: >3 FB Neck ROM: full    Dental  (+) Missing   Pulmonary former smoker   Pulmonary exam normal        Cardiovascular Exercise Tolerance: Good hypertension, (-) angina Normal cardiovascular exam     Neuro/Psych  PSYCHIATRIC DISORDERS       Neuromuscular disease    GI/Hepatic Neg liver ROS,GERD  Controlled,,  Endo/Other  negative endocrine ROS    Renal/GU      Musculoskeletal   Abdominal   Peds  Hematology negative hematology ROS (+)   Anesthesia Other Findings Past Medical History: 05/18/2016: Detached retina     Comment:  Overview:   S/p RRD Repair OD with retained S.O. No date: GERD (gastroesophageal reflux disease) No date: History of detached retina repair No date: Hyperlipidemia No date: Hypertension 03/12/2021: Mood disorder [facility])  Past Surgical History: No date: ABDOMINAL HYSTERECTOMY ~2006: COLONOSCOPY     Comment:  normal No date: RETINAL DETACHMENT SURGERY; Right  BMI    Body Mass Index: 26.30 kg/m      Reproductive/Obstetrics negative OB ROS                             Anesthesia Physical Anesthesia Plan  ASA: 3  Anesthesia Plan: General ETT, Rapid Sequence and Cricoid Pressure   Post-op Pain Management:    Induction: Intravenous  PONV Risk Score and Plan: Ondansetron, Dexamethasone, Midazolam and Treatment may vary due to age or medical condition  Airway Management Planned: Oral ETT  Additional Equipment:   Intra-op Plan:   Post-operative Plan: Possible Post-op intubation/ventilation  Informed Consent: I have reviewed the  patients History and Physical, chart, labs and discussed the procedure including the risks, benefits and alternatives for the proposed anesthesia with the patient or authorized representative who has indicated his/her understanding and acceptance.     Dental Advisory Given  Plan Discussed with: Anesthesiologist, CRNA and Surgeon  Anesthesia Plan Comments: (Patient consented for risks of anesthesia including but not limited to:  - adverse reactions to medications - damage to eyes, teeth, lips or other oral mucosa - nerve damage due to positioning  - sore throat or hoarseness - Damage to heart, brain, nerves, lungs, other parts of body or loss of life  Patient voiced understanding.)       Anesthesia Quick Evaluation

## 2023-10-01 NOTE — Progress Notes (Signed)
  I have reviewed and concur with this student's documentation.   Allyn Kenner , BSN, RN  10/01/23

## 2023-10-01 NOTE — Progress Notes (Signed)
PROGRESS NOTE    Kristy Sellers   MWN:[id number] DOB: Sep 05, 1941  DOA: 09/30/2023 Date of Service: 10/01/23 which is hospital day 1  PCP: Reubin Milan, MD    HPI: Kristy Sellers is a 82 y.o. female with medical history significant of hypertension, hyperlipidemia, GERD, detached retina with right eye blindness, C. difficile colitis, mood disorder, who presents with nausea, vomiting, LLQ abdominal pain and constipation x4 days, several episodes of nonbilious nonbloody vomiting   Hospital course / significant events:  10/02: admitted to hospitalist service for SBO w/ general surgical consult. Started NG but developed worsening pain, plan for surgery.  10/03: overnight, underwent exploratory laparotomy, lysis of adhesions, small bowel resection, partial omentectomy - Dr Maurine Minister. NG tube in place.  NPO and NG tube to low intermittent wall suction, pain control, may need PCA.   Consultants:  General Surgery  Procedures/Surgeries: 10/01/23  Exploratory laparotomy, lysis of adhesions, small bowel resection, partial omentectomy - Dr Maurine Minister      ASSESSMENT & PLAN:   SBO (small bowel obstruction) s/p exploratory laparotomy, lysis of adhesions, small bowel resection, partial omentectomy Pain control: As needed morphine, fentanyl, Percocet, Tylenol N.p.o. w/ NG to low intermittent suction IV fluid: 1 L normal saline, then 75 cc/h Ok to rest for today, will try OOB w/ PT/OT tomorrow   Leucocytosis WBC 16.4, lactic acid 1.5, procalcitonin<0.10.  Likely reactive. Hold off antibiotics Monitor for infectious signs/symptoms, Monitor VS/WBC    Hypokalemia:  Potassium 3.4 Repleted potassium Check magnesium level --> 2.3   Essential hypertension IV hydralazine as needed Home Amlodipine   HLD (hyperlipidemia) Zocor      No concerns based on BMI: Body mass index is 26.3 kg/m.   DVT prophylaxis: SCD IV fluids: NS 75 mL/h continuous IV fluids  Nutrition: NPO pending general  surgical clearance for intake  Central lines / invasive devices: NG, Foley  Code Status: FULL CODE ACP documentation reviewed: 10/01/23 and none on file in [facility]  TOC needs: TBD expect may need HH/SNF rehab pending clinical course  Barriers to dispo / significant pending items: clinical improvement, see above             Subjective / Brief ROS:  Patient reports feeling tired today, pain is improved though  Denies CP/SOB.  Pain controlled.  Denies new weakness.  Reports no concerns w/ urination/defecation.   Family Communication: daughters are at bedside on rounds     Objective Findings:  Vitals:   10/01/23 0345 10/01/23 0400 10/01/23 0441 10/01/23 0809  BP: 129/69 128/67 137/73 119/62  Pulse: 71 68 71 77  Resp: 17 17 18 16   Temp:  98.2 F (36.8 C) 98.5 F (36.9 C) 98 F (36.7 C)  TempSrc:    Oral  SpO2: 98% 98% 99% 97%  Weight:        Intake/Output Summary (Last 24 hours) at 10/01/2023 1336 Last data filed at 10/01/2023 1106 Gross per 24 hour  Intake 1200 ml  Output 170 ml  Net 1030 ml   Filed Weights   09/30/23 1339  Weight: 71.7 kg    Examination:  Physical Exam Constitutional:      General: She is not in acute distress.    Appearance: She is ill-appearing.  Cardiovascular:     Rate and Rhythm: Normal rate and regular rhythm.  Pulmonary:     Breath sounds: Normal breath sounds.  Neurological:     General: No focal deficit present.     Mental Status: She is alert and  oriented to person, place, and time.  Psychiatric:        Mood and Affect: Mood normal.        Behavior: Behavior normal.          Scheduled Medications:   atropine  1 drop Right Eye Daily   prednisoLONE acetate  1 drop Right Eye Daily    Continuous Infusions:  sodium chloride 75 mL/hr at 10/01/23 0051   acetaminophen Stopped (10/01/23 0738)    PRN Medications:  fentaNYL (SUBLIMAZE) injection, hydrALAZINE, morphine injection, ondansetron (ZOFRAN) IV, mouth  rinse  Antimicrobials from admission:  Anti-infectives (From admission, onward)    Start     Dose/Rate Route Frequency Ordered Stop   10/01/23 0030  cefoTEtan (CEFOTAN) 1 g in sodium chloride 0.9 % 100 mL IVPB        1 g 200 mL/hr over 30 Minutes Intravenous  Once 10/01/23 0015 10/01/23 0119           Data Reviewed:  I have personally reviewed the following...  CBC: Recent Labs  Lab 09/30/23 1345 10/01/23 0522  WBC 16.4* 13.3*  HGB 12.4 12.7  HCT 41.0 40.3  MCV 84.0 80.8  PLT 318 279   Basic Metabolic Panel: Recent Labs  Lab 09/30/23 1345 10/01/23 0522  NA 140 142  K 3.4* 3.4*  CL 106 109  CO2 25 24  GLUCOSE 121* 146*  BUN 16 13  CREATININE 0.77 0.75  CALCIUM 8.8* 8.3*  MG 2.3  --    GFR: Estimated Creatinine Clearance: 53.8 mL/min (by C-G formula based on SCr of 0.75 mg/dL). Liver Function Tests: Recent Labs  Lab 09/30/23 1345  AST 20  ALT 12  ALKPHOS 59  BILITOT 0.8  PROT 7.5  ALBUMIN 3.7   Recent Labs  Lab 09/30/23 1345  LIPASE 21   No results for input(s): "AMMONIA" in the last 168 hours. Coagulation Profile: No results for input(s): "INR", "PROTIME" in the last 168 hours. Cardiac Enzymes: No results for input(s): "CKTOTAL", "CKMB", "CKMBINDEX", "TROPONINI" in the last 168 hours. BNP (last 3 results) No results for input(s): "PROBNP" in the last 8760 hours. HbA1C: No results for input(s): "HGBA1C" in the last 72 hours. CBG: Recent Labs  Lab 10/01/23 0453 10/01/23 0745  GLUCAP 134* 147*   Lipid Profile: No results for input(s): "CHOL", "HDL", "LDLCALC", "TRIG", "CHOLHDL", "LDLDIRECT" in the last 72 hours. Thyroid Function Tests: No results for input(s): "TSH", "T4TOTAL", "FREET4", "T3FREE", "THYROIDAB" in the last 72 hours. Anemia Panel: No results for input(s): "VITAMINB12", "FOLATE", "FERRITIN", "TIBC", "IRON", "RETICCTPCT" in the last 72 hours. Most Recent Urinalysis On File:     Component Value Date/Time   COLORURINE  YELLOW (A) 09/30/2023 1345   APPEARANCEUR HAZY (A) 09/30/2023 1345   APPEARANCEUR CLEAR 01/09/2015 0938   LABSPEC 1.027 09/30/2023 1345   LABSPEC 1.010 01/09/2015 0938   PHURINE 8.0 09/30/2023 1345   GLUCOSEU NEGATIVE 09/30/2023 1345   GLUCOSEU NEGATIVE 01/09/2015 0938   HGBUR NEGATIVE 09/30/2023 1345   BILIRUBINUR NEGATIVE 09/30/2023 1345   BILIRUBINUR neg 11/03/2022 0927   BILIRUBINUR NEGATIVE 01/09/2015 0938   KETONESUR 5 (A) 09/30/2023 1345   PROTEINUR NEGATIVE 09/30/2023 1345   UROBILINOGEN 0.2 11/03/2022 0927   NITRITE NEGATIVE 09/30/2023 1345   LEUKOCYTESUR NEGATIVE 09/30/2023 1345   LEUKOCYTESUR MODERATE 01/09/2015 0938   Sepsis Labs: @LABRCNTIP (procalcitonin:4,lacticidven:4) Microbiology: No results found for this or any previous visit (from the past 240 hour(s)).    Radiology Studies last 3 days: DG Abd Portable 1 View  Result Date: 09/30/2023 CLINICAL DATA:  NG tube placement EXAM: PORTABLE ABDOMEN - 1 VIEW COMPARISON:  Same day CT abdomen and pelvis FINDINGS: Enteric tube tip and side-port in the stomach. Remainder unchanged from same day CT abdomen and pelvis given differences in technique. IMPRESSION: Enteric tube tip and side-port in the stomach. Electronically Signed   By: Minerva Fester M.D.   On: 09/30/2023 19:15   CT ABDOMEN PELVIS W CONTRAST  Result Date: 09/30/2023 CLINICAL DATA:  Abdominal pain, nausea, and vomiting for several days. Constipation. EXAM: CT ABDOMEN AND PELVIS WITH CONTRAST TECHNIQUE: Multidetector CT imaging of the abdomen and pelvis was performed using the standard protocol following bolus administration of intravenous contrast. RADIATION DOSE REDUCTION: This exam was performed according to the departmental dose-optimization program which includes automated exposure control, adjustment of the mA and/or kV according to patient size and/or use of iterative reconstruction technique. CONTRAST:  OMNIPAQUE IOHEXOL 300 MG/ML  SOLN COMPARISON:   09/06/2015 FINDINGS: Lower Chest: No acute findings. Hepatobiliary: Several benign-appearing hepatic cysts are again noted. No suspicious hepatic masses identified. Gallbladder is unremarkable. No evidence of biliary ductal dilatation. Pancreas:  No mass or inflammatory changes. Spleen: Within normal limits in size and appearance. Adrenals/Urinary Tract: No suspicious masses identified. No evidence of ureteral calculi or hydronephrosis. Stomach/Bowel: Several fluid-filled moderately dilated small bowel loops are seen which are localized to the left lower quadrant, with a swirling or twisting pattern in the central mesentery. This is consistent with a small bowel obstruction, and is suspicious for a closed loop obstruction possibly due to internal hernia. No evidence of bowel wall thickening or pneumatosis. No evidence of free intraperitoneal air or abscess. Diffuse severe colonic diverticulosis is noted, however, there is no evidence of diverticulitis. Normal appendix visualized. Vascular/Lymphatic: No pathologically enlarged lymph nodes. No acute vascular findings. Reproductive: Prior hysterectomy noted. Adnexal regions are unremarkable in appearance. Other:  None. Musculoskeletal:  No suspicious bone lesions identified. IMPRESSION: Small bowel obstruction in left lower quadrant, with features suspicious for a closed loop obstruction possibly due to internal hernia. Surgical consultation is recommended. Severe diffuse colonic diverticulosis, without radiographic evidence of diverticulitis. Electronically Signed   By: Danae Orleans M.D.   On: 09/30/2023 16:00          Sunnie Nielsen, DO [facility] 10/01/2023, 1:36 PM    Dictation software may have been used to generate the above note. Typos may occur and escape review in typed/dictated notes. Please contact Dr Lyn Hollingshead directly for clarity if needed.  Staff may message me via secure chat in Epic  but this may not receive an immediate  response,  please page me for urgent matters!  If 7PM-7AM, please contact night coverage [email or web address]

## 2023-10-01 NOTE — Hospital Course (Addendum)
HPI: Kristy Sellers is a 82 y.o. female with medical history significant of hypertension, hyperlipidemia, GERD, detached retina with right eye blindness, C. difficile colitis, mood disorder, who presents with nausea, vomiting, LLQ abdominal pain and constipation x4 days, several episodes of nonbilious nonbloody vomiting   Hospital course / significant events:  10/02: admitted to hospitalist service for SBO w/ general surgical consult. Started NG but developed worsening pain, plan for surgery.  10/03: overnight, underwent exploratory laparotomy, lysis of adhesions, small bowel resection, partial omentectomy - Dr Maurine Minister. NG tube in place.  NPO and NG tube to low intermittent wall suction, pain control, may need PCA.  10/04: OOB a bit more today 10/05: d/c NG and advance to CLD 10/06: flatus, no BM. Advance to FLD and await better bowel function before advancing to a soft diet.  10/07: small BM, pt feeling constipation, hopefully can advance to soft diet later today  10/08: still no significant BM, maintain FLD for now 10/9.  Still no bowel movement.  General surgery advanced diet to soft. 10/10.  Patient had a small bowel movement yesterday evening and a large bowel movement this morning after MiraLAX.  Stable for discharge home.  Consultants:  General Surgery  Procedures/Surgeries: 10/01/23  Exploratory laparotomy, lysis of adhesions, small bowel resection, partial omentectomy - Dr Maurine Minister

## 2023-10-01 NOTE — Progress Notes (Signed)
I was notified by Dr. Clyde Lundborg that the patient had worsening of her pain.  I presented to bedside she was hemodynamically normal with a heart rate of 80.  However she looked more uncomfortable than my previous exam.  On exam she is tender throughout with some involuntary guarding.  My concern is that her worsening pain is secondary to ischemia from a closed-loop obstruction and I think the best course of action is to take her to the operating room for an exploratory laparotomy.  I discussed this at length with the patient and her 2 daughters that the plan will be to explore her and relieve the obstruction.  I also discussed that she may need a bowel resection depending on the viability of her small bowel.  We talked about the risks, benefits and alternatives of the procedure including the risk of infection, bleeding, damage to the small bowel and need for resection.  I discussed with the patient that my concern if she does not proceed with surgery is that the bowel will become ischemic and necrotic and could potentially lead to death.  She understands this and consent has been signed.

## 2023-10-01 NOTE — Progress Notes (Signed)
Patient is status post ex lap with lysis of adhesions and small bowel resection and partial omentectomy.  She has NG tube in place.  Keep n.p.o. with NG tube to low intermittent wall suction.  I have added IV Tylenol for pain.  If current regimen is not controlling her pain she may need PCA.

## 2023-10-01 NOTE — Plan of Care (Signed)
  Problem: Pain Managment: Goal: General experience of comfort will improve Outcome: Progressing   Problem: Safety: Goal: Ability to remain free from injury will improve Outcome: Progressing   

## 2023-10-01 NOTE — Anesthesia Procedure Notes (Signed)
Procedure Name: Intubation Date/Time: 10/01/2023 1:00 AM  Performed by: Katherine Basset, CRNAPre-anesthesia Checklist: Patient identified, Emergency Drugs available, Suction available and Patient being monitored Patient Re-evaluated:Patient Re-evaluated prior to induction Oxygen Delivery Method: Circle system utilized Preoxygenation: Pre-oxygenation with 100% oxygen Induction Type: IV induction, Rapid sequence and Cricoid Pressure applied Laryngoscope Size: Miller and 2 Grade View: Grade I Tube type: Oral Tube size: 7.0 mm Number of attempts: 1 Airway Equipment and Method: Stylet and Oral airway Placement Confirmation: ETT inserted through vocal cords under direct vision, positive ETCO2 and breath sounds checked- equal and bilateral Secured at: 21 cm Tube secured with: Tape Dental Injury: Teeth and Oropharynx as per pre-operative assessment

## 2023-10-02 ENCOUNTER — Encounter: Payer: Self-pay | Admitting: General Surgery

## 2023-10-02 DIAGNOSIS — K56609 Unspecified intestinal obstruction, unspecified as to partial versus complete obstruction: Secondary | ICD-10-CM | POA: Diagnosis not present

## 2023-10-02 LAB — SURGICAL PATHOLOGY

## 2023-10-02 LAB — GLUCOSE, CAPILLARY: Glucose-Capillary: 82 mg/dL (ref 70–99)

## 2023-10-02 MED ORDER — HYALURONIDASE HUMAN 150 UNIT/ML IJ SOLN
150.0000 [IU] | Freq: Once | INTRAMUSCULAR | Status: AC
  Administered 2023-10-02: 150 [IU] via SUBCUTANEOUS
  Filled 2023-10-02: qty 1

## 2023-10-02 MED ORDER — POTASSIUM CHLORIDE 10 MEQ/100ML IV SOLN
10.0000 meq | INTRAVENOUS | Status: AC
  Administered 2023-10-02: 10 meq via INTRAVENOUS
  Filled 2023-10-02: qty 100

## 2023-10-02 MED ORDER — POTASSIUM CHLORIDE 10 MEQ/100ML IV SOLN
10.0000 meq | Freq: Once | INTRAVENOUS | Status: AC
  Administered 2023-10-02: 10 meq via INTRAVENOUS
  Filled 2023-10-02: qty 100

## 2023-10-02 MED ORDER — PANTOPRAZOLE SODIUM 40 MG IV SOLR
40.0000 mg | INTRAVENOUS | Status: DC
  Administered 2023-10-02 – 2023-10-04 (×3): 40 mg via INTRAVENOUS
  Filled 2023-10-02 (×3): qty 10

## 2023-10-02 MED ORDER — CHLORHEXIDINE GLUCONATE CLOTH 2 % EX PADS
6.0000 | MEDICATED_PAD | Freq: Every day | CUTANEOUS | Status: DC
  Administered 2023-10-02 – 2023-10-04 (×3): 6 via TOPICAL

## 2023-10-02 NOTE — Care Management Important Message (Signed)
Important Message  Patient Details  Name: Kristy Sellers MRN: [id number] Date of Birth: [birth year]   Important Message Given:  Yes - Medicare IM     Johnell Comings 10/02/2023, 2:03 PM

## 2023-10-02 NOTE — Progress Notes (Signed)
PROGRESS NOTE    Kristy Sellers   YQI:[id number] DOB: 1941/06/26  DOA: 09/30/2023 Date of Service: 10/02/23 which is hospital day 2  PCP: Reubin Milan, MD    HPI: Kristy Sellers is a 82 y.o. female with medical history significant of hypertension, hyperlipidemia, GERD, detached retina with right eye blindness, C. difficile colitis, mood disorder, who presents with nausea, vomiting, LLQ abdominal pain and constipation x4 days, several episodes of nonbilious nonbloody vomiting   Hospital course / significant events:  10/02: admitted to hospitalist service for SBO w/ general surgical consult. Started NG but developed worsening pain, plan for surgery.  10/03: overnight, underwent exploratory laparotomy, lysis of adhesions, small bowel resection, partial omentectomy - Dr Maurine Minister. NG tube in place.  NPO and NG tube to low intermittent wall suction, pain control, may need PCA.  10/04: OOB a bit more today  Consultants:  General Surgery  Procedures/Surgeries: 10/01/23  Exploratory laparotomy, lysis of adhesions, small bowel resection, partial omentectomy - Dr Maurine Minister      ASSESSMENT & PLAN:   SBO (small bowel obstruction) s/p exploratory laparotomy, lysis of adhesions, small bowel resection, partial omentectomy Pain control: As needed morphine, fentanyl, Percocet, Tylenol N.p.o. w/ NG to low intermittent suction, ok to clamp when pt is OOB IV fluid: 1 L normal saline, then 75 cc/h OOB w/ PT/OT today   Leucocytosis WBC 16.4, lactic acid 1.5, procalcitonin<0.10.  Likely reactive. WBC trending down  Hold off antibiotics Monitor for infectious signs/symptoms, Monitor VS/WBC    Hypokalemia Replace as needed Monitor BMP   Essential hypertension IV hydralazine as needed Home Amlodipine held for now until can take po    HLD (hyperlipidemia) Zocor held for now until can take po       No concerns based on BMI: Body mass index is 26.3 kg/m.   DVT prophylaxis: SCD IV fluids:  NS 75 mL/h continuous IV fluids  Nutrition: NPO pending general surgical clearance for intake  Central lines / invasive devices: NG, Foley  Code Status: FULL CODE ACP documentation reviewed: 10/01/23 and none on file in [facility]  TOC needs: TBD expect may need HH/SNF rehab pending clinical course  Barriers to dispo / significant pending items: clinical improvement, see above             Subjective / Brief ROS:  Patient reports feeling hungry No flatus/BM Denies CP/SOB.  Denies new weakness.    Family Communication: support person at bedside on rounds     Objective Findings:  Vitals:   10/01/23 1524 10/01/23 2354 10/02/23 0808 10/02/23 1538  BP: (!) 101/45 121/64 129/64 128/62  Pulse: 76 67 75 78  Resp: 17 18 17 16   Temp: 98.2 F (36.8 C) 98.6 F (37 C) 99 F (37.2 C) 98.7 F (37.1 C)  TempSrc: Oral  Oral   SpO2: 97% 100% 94% 97%  Weight:        Intake/Output Summary (Last 24 hours) at 10/02/2023 1632 Last data filed at 10/02/2023 3875 Gross per 24 hour  Intake 292.23 ml  Output 250 ml  Net 42.23 ml   Filed Weights   09/30/23 1339  Weight: 71.7 kg    Examination:  Physical Exam Constitutional:      General: She is not in acute distress.    Appearance: She is not ill-appearing.  Cardiovascular:     Rate and Rhythm: Normal rate and regular rhythm.  Pulmonary:     Breath sounds: Normal breath sounds.  Abdominal:     Palpations:  There is no mass.     Tenderness: There is no guarding or rebound.  Neurological:     General: No focal deficit present.     Mental Status: She is alert and oriented to person, place, and time.  Psychiatric:        Mood and Affect: Mood normal.        Behavior: Behavior normal.          Scheduled Medications:   atropine  1 drop Right Eye Daily   Chlorhexidine Gluconate Cloth  6 each Topical Daily   hyaluronidase Human  150 Units Subcutaneous Once   pantoprazole (PROTONIX) IV  40 mg Intravenous Q24H    prednisoLONE acetate  1 drop Right Eye Daily    Continuous Infusions:  sodium chloride Stopped (10/01/23 2158)    PRN Medications:  fentaNYL (SUBLIMAZE) injection, hydrALAZINE, ondansetron (ZOFRAN) IV, mouth rinse  Antimicrobials from admission:  Anti-infectives (From admission, onward)    Start     Dose/Rate Route Frequency Ordered Stop   10/01/23 0030  cefoTEtan (CEFOTAN) 1 g in sodium chloride 0.9 % 100 mL IVPB        1 g 200 mL/hr over 30 Minutes Intravenous  Once 10/01/23 0015 10/01/23 0134           Data Reviewed:  I have personally reviewed the following...  CBC: Recent Labs  Lab 09/30/23 1345 10/01/23 0522  WBC 16.4* 13.3*  HGB 12.4 12.7  HCT 41.0 40.3  MCV 84.0 80.8  PLT 318 279   Basic Metabolic Panel: Recent Labs  Lab 09/30/23 1345 10/01/23 0522  NA 140 142  K 3.4* 3.4*  CL 106 109  CO2 25 24  GLUCOSE 121* 146*  BUN 16 13  CREATININE 0.77 0.75  CALCIUM 8.8* 8.3*  MG 2.3  --    GFR: Estimated Creatinine Clearance: 53.8 mL/min (by C-G formula based on SCr of 0.75 mg/dL). Liver Function Tests: Recent Labs  Lab 09/30/23 1345  AST 20  ALT 12  ALKPHOS 59  BILITOT 0.8  PROT 7.5  ALBUMIN 3.7   Recent Labs  Lab 09/30/23 1345  LIPASE 21   No results for input(s): "AMMONIA" in the last 168 hours. Coagulation Profile: No results for input(s): "INR", "PROTIME" in the last 168 hours. Cardiac Enzymes: No results for input(s): "CKTOTAL", "CKMB", "CKMBINDEX", "TROPONINI" in the last 168 hours. BNP (last 3 results) No results for input(s): "PROBNP" in the last 8760 hours. HbA1C: No results for input(s): "HGBA1C" in the last 72 hours. CBG: Recent Labs  Lab 10/01/23 0453 10/01/23 0745 10/02/23 0810  GLUCAP 134* 147* 82   Lipid Profile: No results for input(s): "CHOL", "HDL", "LDLCALC", "TRIG", "CHOLHDL", "LDLDIRECT" in the last 72 hours. Thyroid Function Tests: No results for input(s): "TSH", "T4TOTAL", "FREET4", "T3FREE", "THYROIDAB"  in the last 72 hours. Anemia Panel: No results for input(s): "VITAMINB12", "FOLATE", "FERRITIN", "TIBC", "IRON", "RETICCTPCT" in the last 72 hours. Most Recent Urinalysis On File:     Component Value Date/Time   COLORURINE YELLOW (A) 09/30/2023 1345   APPEARANCEUR HAZY (A) 09/30/2023 1345   APPEARANCEUR CLEAR 01/09/2015 0938   LABSPEC 1.027 09/30/2023 1345   LABSPEC 1.010 01/09/2015 0938   PHURINE 8.0 09/30/2023 1345   GLUCOSEU NEGATIVE 09/30/2023 1345   GLUCOSEU NEGATIVE 01/09/2015 0938   HGBUR NEGATIVE 09/30/2023 1345   BILIRUBINUR NEGATIVE 09/30/2023 1345   BILIRUBINUR neg 11/03/2022 0927   BILIRUBINUR NEGATIVE 01/09/2015 0938   KETONESUR 5 (A) 09/30/2023 1345   PROTEINUR NEGATIVE 09/30/2023  1345   UROBILINOGEN 0.2 11/03/2022 0927   NITRITE NEGATIVE 09/30/2023 1345   LEUKOCYTESUR NEGATIVE 09/30/2023 1345   LEUKOCYTESUR MODERATE 01/09/2015 0938   Sepsis Labs: @LABRCNTIP (procalcitonin:4,lacticidven:4) Microbiology: No results found for this or any previous visit (from the past 240 hour(s)).    Radiology Studies last 3 days: DG Abd Portable 1 View  Result Date: 09/30/2023 CLINICAL DATA:  NG tube placement EXAM: PORTABLE ABDOMEN - 1 VIEW COMPARISON:  Same day CT abdomen and pelvis FINDINGS: Enteric tube tip and side-port in the stomach. Remainder unchanged from same day CT abdomen and pelvis given differences in technique. IMPRESSION: Enteric tube tip and side-port in the stomach. Electronically Signed   By: Minerva Fester M.D.   On: 09/30/2023 19:15   CT ABDOMEN PELVIS W CONTRAST  Result Date: 09/30/2023 CLINICAL DATA:  Abdominal pain, nausea, and vomiting for several days. Constipation. EXAM: CT ABDOMEN AND PELVIS WITH CONTRAST TECHNIQUE: Multidetector CT imaging of the abdomen and pelvis was performed using the standard protocol following bolus administration of intravenous contrast. RADIATION DOSE REDUCTION: This exam was performed according to the departmental  dose-optimization program which includes automated exposure control, adjustment of the mA and/or kV according to patient size and/or use of iterative reconstruction technique. CONTRAST:  OMNIPAQUE IOHEXOL 300 MG/ML  SOLN COMPARISON:  09/06/2015 FINDINGS: Lower Chest: No acute findings. Hepatobiliary: Several benign-appearing hepatic cysts are again noted. No suspicious hepatic masses identified. Gallbladder is unremarkable. No evidence of biliary ductal dilatation. Pancreas:  No mass or inflammatory changes. Spleen: Within normal limits in size and appearance. Adrenals/Urinary Tract: No suspicious masses identified. No evidence of ureteral calculi or hydronephrosis. Stomach/Bowel: Several fluid-filled moderately dilated small bowel loops are seen which are localized to the left lower quadrant, with a swirling or twisting pattern in the central mesentery. This is consistent with a small bowel obstruction, and is suspicious for a closed loop obstruction possibly due to internal hernia. No evidence of bowel wall thickening or pneumatosis. No evidence of free intraperitoneal air or abscess. Diffuse severe colonic diverticulosis is noted, however, there is no evidence of diverticulitis. Normal appendix visualized. Vascular/Lymphatic: No pathologically enlarged lymph nodes. No acute vascular findings. Reproductive: Prior hysterectomy noted. Adnexal regions are unremarkable in appearance. Other:  None. Musculoskeletal:  No suspicious bone lesions identified. IMPRESSION: Small bowel obstruction in left lower quadrant, with features suspicious for a closed loop obstruction possibly due to internal hernia. Surgical consultation is recommended. Severe diffuse colonic diverticulosis, without radiographic evidence of diverticulitis. Electronically Signed   By: Danae Orleans M.D.   On: 09/30/2023 16:00          Sunnie Nielsen, DO [facility] 10/02/2023, 4:32 PM    Dictation software may have been used  to generate the above note. Typos may occur and escape review in typed/dictated notes. Please contact Dr Lyn Hollingshead directly for clarity if needed.  Staff may message me via secure chat in Epic  but this may not receive an immediate response,  please page me for urgent matters!  If 7PM-7AM, please contact night coverage [email or web address]

## 2023-10-02 NOTE — Evaluation (Signed)
Physical Therapy Evaluation Patient Details Name: Kristy Sellers MRN: [id number] DOB: [birth year] Today's Date: 10/02/2023  History of Present Illness  presented to ER secondary to nausea/vomiting, abdominal pain; admitted for management of SBO, s/p ex-lap, LOA, small bowel resection and partial omenectomy (10/3)  Clinical Impression  Patient resting in bed upon arrival to room; multiple family members (sister and great grandchildren) at bedside throughout session.  Patient alert and oriented, follows commands and agreeable to participation with session; does endorse sitting edge of bed/standing earlier today without significant difficulty.  Denies pain in abdomen, "just soreness" over incision.  Bilat UE/LE strength and ROM grossly symmetrical and WFL, except noted with limited R knee flexion (baseline arthritic changes).  Currently requires min assist for bed mobility; min assist for sit/stand (with and without RW) and min assist for gait (180') with IV pole/without assist device.     100' with bilat UEs on IV pole, 100' without assist device, min assist throughout.  With confidence in ability, able to release grasp on RW; fair/good postural extension.  Slow, guarded gait performance with limited trunk rotation and arm swing; limited balance reactions, but no overt buckling or LOB.  Do anticipate comfort/confidence with gait efforts to improve with additional opportunity. Would benefit from skilled PT to address above deficits and promote optimal return to PLOF.; recommend post-acute PT follow up as indicated by interdisciplinary care team.          If plan is discharge home, recommend the following: A little help with walking and/or transfers;A little help with bathing/dressing/bathroom   Can travel by private vehicle        Equipment Recommendations Rolling walker (2 wheels)  Recommendations for Other Services       Functional Status Assessment Patient has had a recent decline in their  functional status and demonstrates the ability to make significant improvements in function in a reasonable and predictable amount of time.     Precautions / Restrictions Precautions Precautions: Fall Precaution Comments: NPO/NGT Restrictions Weight Bearing Restrictions: No      Mobility  Bed Mobility Overal bed mobility: Needs Assistance Bed Mobility: Supine to Sit     Supine to sit: Min assist     General bed mobility comments: educated in log-rolling for abdominal bracing/pain control    Transfers Overall transfer level: Needs assistance Equipment used: Rolling walker (2 wheels) Transfers: Sit to/from Stand Sit to Stand: Min assist           General transfer comment: performed with and without RW, min assist; limited active use of R LE with initial transition (typically lifting self, then stepping R LE back into BOS). Requires heavy use of UEs to complete    Ambulation/Gait Ambulation/Gait assistance: Min assist Gait Distance (Feet): 180 Feet Assistive device: IV Pole, None         General Gait Details: 100' with bilat UEs on IV pole, 100' without assist device, min assist throughout.  With confidence in ability, able to release grasp on RW; fair/good postural extension.  Slow, guarded gait performance with limited trunk rotation and arm swing; limited balance reactions, but no overt buckling or LOB  Stairs            Wheelchair Mobility     Tilt Bed    Modified Rankin (Stroke Patients Only)       Balance Overall balance assessment: Needs assistance Sitting-balance support: No upper extremity supported, Feet supported Sitting balance-Leahy Scale: Good     Standing balance support: No upper  extremity supported Standing balance-Leahy Scale: Fair                               Pertinent Vitals/Pain Pain Assessment Pain Assessment: No/denies pain Pain Location: "just soreness"    Home Living Family/patient expects to be  discharged to:: Private residence Living Arrangements: Alone   Type of Home: House Home Access: Stairs to enter Entrance Stairs-Rails: Right Entrance Stairs-Number of Steps: 4   Home Layout: Two level;Able to live on main level with bedroom/bathroom Home Equipment: None      Prior Function Prior Level of Function : Independent/Modified Independent             Mobility Comments: Indep with ADLs, household and community mobilization without assist device; denies fall history.  Active, walks 3 miles/day on regular basis.       Extremity/Trunk Assessment   Upper Extremity Assessment Upper Extremity Assessment: Overall WFL for tasks assessed    Lower Extremity Assessment Lower Extremity Assessment: Overall WFL for tasks assessed (grossly at least 4/5 throughout; limited R knee flexion (baseline arthritic changes))       Communication      Cognition Arousal: Alert Behavior During Therapy: WFL for tasks assessed/performed Overall Cognitive Status: Within Functional Limits for tasks assessed                                          General Comments      Exercises     Assessment/Plan    PT Assessment Patient needs continued PT services  PT Problem List Decreased range of motion;Decreased activity tolerance;Decreased balance;Decreased mobility;Decreased coordination;Decreased knowledge of precautions;Decreased safety awareness;Decreased knowledge of use of DME;Pain       PT Treatment Interventions DME instruction;Gait training;Stair training;Therapeutic activities;Therapeutic exercise;Functional mobility training;Balance training;Patient/family education    PT Goals (Current goals can be found in the Care Plan section)  Acute Rehab PT Goals Patient Stated Goal: to return home PT Goal Formulation: With patient/family Time For Goal Achievement: 10/16/23 Potential to Achieve Goals: Good    Frequency Min 1X/week     Co-evaluation                AM-PAC PT "6 Clicks" Mobility  Outcome Measure Help needed turning from your back to your side while in a flat bed without using bedrails?: A Little Help needed moving from lying on your back to sitting on the side of a flat bed without using bedrails?: A Little Help needed moving to and from a bed to a chair (including a wheelchair)?: A Little Help needed standing up from a chair using your arms (e.g., wheelchair or bedside chair)?: A Little Help needed to walk in hospital room?: A Little Help needed climbing 3-5 steps with a railing? : A Little 6 Click Score: 18    End of Session   Activity Tolerance: Patient tolerated treatment well Patient left: in chair;with call bell/phone within reach;with chair alarm set Nurse Communication: Mobility status PT Visit Diagnosis: Muscle weakness (generalized) (M62.81);Difficulty in walking, not elsewhere classified (R26.2)    Time: 9562-1308 PT Time Calculation (min) (ACUTE ONLY): 32 min   Charges:   PT Evaluation $PT Eval Moderate Complexity: 1 Mod   PT General Charges $$ ACUTE PT VISIT: 1 Visit        Pasquale Matters H. Manson Passey, PT, DPT, NCS 10/02/23, 3:11  PM [phone]

## 2023-10-02 NOTE — Plan of Care (Signed)

## 2023-10-02 NOTE — Progress Notes (Signed)
[facility] SURGICAL PROGRESS NOTE  Hospital Day(s): 2.   Post op day(s): 1 Day Post-Op.   Interval History:  Patient seen and examined No acute events or new complaints overnight.  Patient reports she is doing well Incisional soreness expectedly No fever, chills, nausea, emesis No new labs this morning Good UO - 650 ccs NGT output not recorded this morning; has about 200 ccs in canister; dark fluid  No flatus Has not been out of bed  Vital signs in last 24 hours: [min-max] current  Temp:  [98 F (36.7 C)-98.6 F (37 C)] 98.6 F (37 C) (10/03 2354) Pulse Rate:  [67-77] 67 (10/03 2354) Resp:  [16-18] 18 (10/03 2354) BP: (101-121)/(45-64) 121/64 (10/03 2354) SpO2:  [97 %-100 %] 100 % (10/03 2354)       Weight: 71.7 kg     Intake/Output last 2 shifts:  10/03 0701 - 10/04 0700 In: 392.2 [P.O.:50; I.V.:242.2; IV Piggyback:100] Out: 650 [Urine:650]   Physical Exam:  Constitutional: alert, cooperative and no distress  HEENT: NGT in place; output dark fluid  Respiratory: breathing non-labored at rest  Cardiovascular: regular rate and sinus rhythm  Gastrointestinal: soft, non-tender, and non-distended, no rebound/guarding Integumentary: Laparotomy is intact with staples; scant drainage inferiorly on honey comb, early ecchymosis to inferior portion as well   Labs:     Latest Ref Rng & Units 10/01/2023    5:22 AM 09/30/2023    1:45 PM 12/05/2022    8:50 AM  CBC  WBC 4.0 - 10.5 K/uL 13.3  16.4  8.3   Hemoglobin 12.0 - 15.0 g/dL 60.4  54.0  98.1   Hematocrit 36.0 - 46.0 % 40.3  41.0  39.4   Platelets 150 - 400 K/uL 279  318  276       Latest Ref Rng & Units 10/01/2023    5:22 AM 09/30/2023    1:45 PM 11/04/2022   10:45 AM  CMP  Glucose 70 - 99 mg/dL 191  478  84   BUN 8 - 23 mg/dL 13  16  15    Creatinine 0.44 - 1.00 mg/dL 2.95  6.21  3.08   Sodium 135 - 145 mmol/L 142  140  144   Potassium 3.5 - 5.1 mmol/L 3.4  3.4  3.8   Chloride 98 - 111 mmol/L 109   106  102   CO2 22 - 32 mmol/L 24  25  24    Calcium 8.9 - 10.3 mg/dL 8.3  8.8  9.3   Total Protein 6.5 - 8.1 g/dL  7.5  6.8   Total Bilirubin 0.3 - 1.2 mg/dL  0.8  0.5   Alkaline Phos 38 - 126 U/L  59  87   AST 15 - 41 U/L  20  15   ALT 0 - 44 U/L  12  8      Imaging studies: No new pertinent imaging studies   Assessment/Plan:  82 y.o. female awaiting return of bowel function  1 Day Post-Op s/p exploratory laparotomy and small bowel resection for closed loop obstruction.   - Continue NPO this morning + IVF support - Continue NGT decompression; LIS; monitor and record output - Monitor abdominal examination; on-going bowel function   - Pain control prn; antiemetics prn   - Mobilize; engage therapies; okay to clamp NGT to ambulate   - Further management per primary service; we will follow    All of the above findings and recommendations were discussed with the patient, patient's  family (daughter on the phone), and the medical team, and all of patient's and family's questions were answered to their expressed satisfaction.  -- Lynden Oxford, PA-C [facility] 10/02/2023, 7:57 AM M-F: 7am - 4pm

## 2023-10-03 DIAGNOSIS — K56609 Unspecified intestinal obstruction, unspecified as to partial versus complete obstruction: Secondary | ICD-10-CM | POA: Diagnosis not present

## 2023-10-03 LAB — BASIC METABOLIC PANEL
Anion gap: 12 (ref 5–15)
BUN: 21 mg/dL (ref 8–23)
CO2: 21 mmol/L — ABNORMAL LOW (ref 22–32)
Calcium: 8 mg/dL — ABNORMAL LOW (ref 8.9–10.3)
Chloride: 111 mmol/L (ref 98–111)
Creatinine, Ser: 0.74 mg/dL (ref 0.44–1.00)
GFR, Estimated: >60 mL/min (ref >60)
Glucose, Bld: 62 mg/dL — ABNORMAL LOW (ref 70–99)
Potassium: 3.5 mmol/L (ref 3.5–5.1)
Sodium: 144 mmol/L (ref 135–145)

## 2023-10-03 LAB — CBC
HCT: 32.3 % — ABNORMAL LOW (ref 36.0–46.0)
Hemoglobin: 10 g/dL — ABNORMAL LOW (ref 12.0–15.0)
MCH: 25.2 pg — ABNORMAL LOW (ref 26.0–34.0)
MCHC: 31 g/dL (ref 30.0–36.0)
MCV: 81.4 fL (ref 80.0–100.0)
Platelets: 220 10*3/uL (ref 150–400)
RBC: 3.97 MIL/uL (ref 3.87–5.11)
RDW: 17 % — ABNORMAL HIGH (ref 11.5–15.5)
WBC: 11.8 10*3/uL — ABNORMAL HIGH (ref 4.0–10.5)
nRBC: 0 % (ref 0.0–0.2)

## 2023-10-03 LAB — GLUCOSE, CAPILLARY: Glucose-Capillary: 68 mg/dL — ABNORMAL LOW (ref 70–99)

## 2023-10-03 MED ORDER — DEXTROSE 50 % IV SOLN
1.0000 | Freq: Once | INTRAVENOUS | Status: AC
  Administered 2023-10-03: 50 mL via INTRAVENOUS
  Filled 2023-10-03: qty 50

## 2023-10-03 NOTE — Plan of Care (Signed)

## 2023-10-03 NOTE — Progress Notes (Signed)
10/03/2023  Subjective: Patient is 2 Days Post-Op status post exploratory laparotomy with lysis of adhesions and small bowel resection for closed-loop small bowel obstruction.  No acute events overnight.  Patient reports this morning she started having flatus.  Her pain is controlled.  Denies any nausea or vomiting.  NG tube had low output of 200 mL.  Vital signs: Temp:  [98.7 F (37.1 C)-98.9 F (37.2 C)] 98.9 F (37.2 C) (10/04 2358) Pulse Rate:  [78] 78 (10/04 2358) Resp:  [16-18] 18 (10/04 2358) BP: (124-128)/(57-62) 124/57 (10/04 2358) SpO2:  [94 %-97 %] 94 % (10/04 2358)   Intake/Output: 10/04 0701 - 10/05 0700 In: 315.8 [I.V.:315.8] Out: 950 [Urine:750; Emesis/NG output:200] Last BM Date :  (pta)  Physical Exam: Constitutional: No acute distress Abdomen: Soft, nondistended, appropriately tender to palpation.  Midline incision is intact with staples in place with no evidence of infection.  Dressing removed.  NG tube in place.  Labs:  Recent Labs    10/01/23 0522 10/03/23 0402  WBC 13.3* 11.8*  HGB 12.7 10.0*  HCT 40.3 32.3*  PLT 279 220   Recent Labs    10/01/23 0522 10/03/23 0402  NA 142 144  K 3.4* 3.5  CL 109 111  CO2 24 21*  GLUCOSE 146* 62*  BUN 13 21  CREATININE 0.75 0.74  CALCIUM 8.3* 8.0*   No results for input(s): "LABPROT", "INR" in the last 72 hours.  Imaging: No results found.  Assessment/Plan: This is a 82 y.o. female s/p exploratory laparotomy with bowel resection and lysis of adhesions for closed-loop obstruction.  - Patient is starting to have return of bowel function and is having flatus this morning.  Her abdomen is soft and minimally distended.  Discussed with her that we can do a clamping trial this morning and we will recheck residuals around 1:30 PM today.  If the volume is less than 150 mL, then we should be able to DC the NG tube and start a clear liquid diet. - Continue IV fluids for now for hydration. - Continue working with  physical therapy.   Howie Ill, MD [facility]

## 2023-10-03 NOTE — Progress Notes (Addendum)
PROGRESS NOTE    Kristy Sellers   ZOX:[id number] DOB: 11-26-1941  DOA: 09/30/2023 Date of Service: 10/03/23 which is hospital day 3  PCP: Reubin Milan, MD    HPI: Kristy Sellers is a 82 y.o. female with medical history significant of hypertension, hyperlipidemia, GERD, detached retina with right eye blindness, C. difficile colitis, mood disorder, who presents with nausea, vomiting, LLQ abdominal pain and constipation x4 days, several episodes of nonbilious nonbloody vomiting   Hospital course / significant events:  10/02: admitted to hospitalist service for SBO w/ general surgical consult. Started NG but developed worsening pain, plan for surgery.  10/03: overnight, underwent exploratory laparotomy, lysis of adhesions, small bowel resection, partial omentectomy - Dr Maurine Minister. NG tube in place.  NPO and NG tube to low intermittent wall suction, pain control, may need PCA.  10/04: OOB a bit more today 10/05:  d/c NG and advance to clear liquids   Consultants:  General Surgery  Procedures/Surgeries: 10/01/23  Exploratory laparotomy, lysis of adhesions, small bowel resection, partial omentectomy - Dr Maurine Minister      ASSESSMENT & PLAN:   SBO (small bowel obstruction) s/p exploratory laparotomy, lysis of adhesions, small bowel resection, partial omentectomy Pain control D/c NG tube Clear liquid diet  OOB w/ PT/OT    Leucocytosis - improving  Hold off antibiotics Monitor for infectious signs/symptoms, Monitor VS/WBC    Hypokalemia Replace as needed Monitor BMP  Hypoglycemia D50 prn    Essential hypertension - has been normotensive  IV hydralazine as needed Home Amlodipine held for now    HLD (hyperlipidemia) Zocor held for now until can take po       No concerns based on BMI: Body mass index is 26.3 kg/m.   DVT prophylaxis: SCD IV fluids: NS 75 mL/h continuous IV fluids - can d/c once tolerating clears  Nutrition: CLD today  Central lines / invasive devices: NG,  Foley  Code Status: FULL CODE ACP documentation reviewed: 10/01/23 and none on file in [facility]  TOC needs: TBD expect may need HH/SNF rehab pending clinical course  Barriers to dispo / significant pending items: potentially home tomorrow pending surgical clearance and tolerating po / bowel function              Subjective / Brief ROS:  Patient reports feeling hungry No flatus/BM Denies CP/SOB.  Denies new weakness.    Family Communication: support person at bedside on rounds     Objective Findings:  Vitals:   10/01/23 2354 10/02/23 0808 10/02/23 1538 10/02/23 2358  BP: 121/64 129/64 128/62 (!) 124/57  Pulse: 67 75 78 78  Resp: 18 17 16 18   Temp: 98.6 F (37 C) 99 F (37.2 C) 98.7 F (37.1 C) 98.9 F (37.2 C)  TempSrc:  Oral    SpO2: 100% 94% 97% 94%  Weight:        Intake/Output Summary (Last 24 hours) at 10/03/2023 1448 Last data filed at 10/03/2023 0530 Gross per 24 hour  Intake 315.75 ml  Output 950 ml  Net -634.25 ml   Filed Weights   09/30/23 1339  Weight: 71.7 kg    Examination:  Physical Exam Constitutional:      General: She is not in acute distress.    Appearance: She is not ill-appearing.  Cardiovascular:     Rate and Rhythm: Normal rate and regular rhythm.  Pulmonary:     Breath sounds: Normal breath sounds.  Abdominal:     General: Bowel sounds are normal.  Palpations: There is no mass.     Tenderness: There is no guarding or rebound.  Neurological:     General: No focal deficit present.     Mental Status: She is alert and oriented to person, place, and time.  Psychiatric:        Mood and Affect: Mood normal.        Behavior: Behavior normal.          Scheduled Medications:   atropine  1 drop Right Eye Daily   Chlorhexidine Gluconate Cloth  6 each Topical Daily   pantoprazole (PROTONIX) IV  40 mg Intravenous Q24H   prednisoLONE acetate  1 drop Right Eye Daily    Continuous Infusions:  sodium chloride 75 mL/hr at  10/02/23 2257    PRN Medications:  fentaNYL (SUBLIMAZE) injection, hydrALAZINE, ondansetron (ZOFRAN) IV, mouth rinse  Antimicrobials from admission:  Anti-infectives (From admission, onward)    Start     Dose/Rate Route Frequency Ordered Stop   10/01/23 0030  cefoTEtan (CEFOTAN) 1 g in sodium chloride 0.9 % 100 mL IVPB        1 g 200 mL/hr over 30 Minutes Intravenous  Once 10/01/23 0015 10/01/23 0134           Data Reviewed:  I have personally reviewed the following...  CBC: Recent Labs  Lab 09/30/23 1345 10/01/23 0522 10/03/23 0402  WBC 16.4* 13.3* 11.8*  HGB 12.4 12.7 10.0*  HCT 41.0 40.3 32.3*  MCV 84.0 80.8 81.4  PLT 318 279 220   Basic Metabolic Panel: Recent Labs  Lab 09/30/23 1345 10/01/23 0522 10/03/23 0402  NA 140 142 144  K 3.4* 3.4* 3.5  CL 106 109 111  CO2 25 24 21*  GLUCOSE 121* 146* 62*  BUN 16 13 21   CREATININE 0.77 0.75 0.74  CALCIUM 8.8* 8.3* 8.0*  MG 2.3  --   --    GFR: Estimated Creatinine Clearance: 53.8 mL/min (by C-G formula based on SCr of 0.74 mg/dL). Liver Function Tests: Recent Labs  Lab 09/30/23 1345  AST 20  ALT 12  ALKPHOS 59  BILITOT 0.8  PROT 7.5  ALBUMIN 3.7   Recent Labs  Lab 09/30/23 1345  LIPASE 21   No results for input(s): "AMMONIA" in the last 168 hours. Coagulation Profile: No results for input(s): "INR", "PROTIME" in the last 168 hours. Cardiac Enzymes: No results for input(s): "CKTOTAL", "CKMB", "CKMBINDEX", "TROPONINI" in the last 168 hours. BNP (last 3 results) No results for input(s): "PROBNP" in the last 8760 hours. HbA1C: No results for input(s): "HGBA1C" in the last 72 hours. CBG: Recent Labs  Lab 10/01/23 0453 10/01/23 0745 10/02/23 0810 10/03/23 1145  GLUCAP 134* 147* 82 68*   Lipid Profile: No results for input(s): "CHOL", "HDL", "LDLCALC", "TRIG", "CHOLHDL", "LDLDIRECT" in the last 72 hours. Thyroid Function Tests: No results for input(s): "TSH", "T4TOTAL", "FREET4",  "T3FREE", "THYROIDAB" in the last 72 hours. Anemia Panel: No results for input(s): "VITAMINB12", "FOLATE", "FERRITIN", "TIBC", "IRON", "RETICCTPCT" in the last 72 hours. Most Recent Urinalysis On File:     Component Value Date/Time   COLORURINE YELLOW (A) 09/30/2023 1345   APPEARANCEUR HAZY (A) 09/30/2023 1345   APPEARANCEUR CLEAR 01/09/2015 0938   LABSPEC 1.027 09/30/2023 1345   LABSPEC 1.010 01/09/2015 0938   PHURINE 8.0 09/30/2023 1345   GLUCOSEU NEGATIVE 09/30/2023 1345   GLUCOSEU NEGATIVE 01/09/2015 0938   HGBUR NEGATIVE 09/30/2023 1345   BILIRUBINUR NEGATIVE 09/30/2023 1345   BILIRUBINUR neg 11/03/2022 1610  BILIRUBINUR NEGATIVE 01/09/2015 0938   KETONESUR 5 (A) 09/30/2023 1345   PROTEINUR NEGATIVE 09/30/2023 1345   UROBILINOGEN 0.2 11/03/2022 0927   NITRITE NEGATIVE 09/30/2023 1345   LEUKOCYTESUR NEGATIVE 09/30/2023 1345   LEUKOCYTESUR MODERATE 01/09/2015 0938   Sepsis Labs: @LABRCNTIP (procalcitonin:4,lacticidven:4) Microbiology: No results found for this or any previous visit (from the past 240 hour(s)).    Radiology Studies last 3 days: DG Abd Portable 1 View  Result Date: 09/30/2023 CLINICAL DATA:  NG tube placement EXAM: PORTABLE ABDOMEN - 1 VIEW COMPARISON:  Same day CT abdomen and pelvis FINDINGS: Enteric tube tip and side-port in the stomach. Remainder unchanged from same day CT abdomen and pelvis given differences in technique. IMPRESSION: Enteric tube tip and side-port in the stomach. Electronically Signed   By: Minerva Fester M.D.   On: 09/30/2023 19:15   CT ABDOMEN PELVIS W CONTRAST  Result Date: 09/30/2023 CLINICAL DATA:  Abdominal pain, nausea, and vomiting for several days. Constipation. EXAM: CT ABDOMEN AND PELVIS WITH CONTRAST TECHNIQUE: Multidetector CT imaging of the abdomen and pelvis was performed using the standard protocol following bolus administration of intravenous contrast. RADIATION DOSE REDUCTION: This exam was performed according to the  departmental dose-optimization program which includes automated exposure control, adjustment of the mA and/or kV according to patient size and/or use of iterative reconstruction technique. CONTRAST:  OMNIPAQUE IOHEXOL 300 MG/ML  SOLN COMPARISON:  09/06/2015 FINDINGS: Lower Chest: No acute findings. Hepatobiliary: Several benign-appearing hepatic cysts are again noted. No suspicious hepatic masses identified. Gallbladder is unremarkable. No evidence of biliary ductal dilatation. Pancreas:  No mass or inflammatory changes. Spleen: Within normal limits in size and appearance. Adrenals/Urinary Tract: No suspicious masses identified. No evidence of ureteral calculi or hydronephrosis. Stomach/Bowel: Several fluid-filled moderately dilated small bowel loops are seen which are localized to the left lower quadrant, with a swirling or twisting pattern in the central mesentery. This is consistent with a small bowel obstruction, and is suspicious for a closed loop obstruction possibly due to internal hernia. No evidence of bowel wall thickening or pneumatosis. No evidence of free intraperitoneal air or abscess. Diffuse severe colonic diverticulosis is noted, however, there is no evidence of diverticulitis. Normal appendix visualized. Vascular/Lymphatic: No pathologically enlarged lymph nodes. No acute vascular findings. Reproductive: Prior hysterectomy noted. Adnexal regions are unremarkable in appearance. Other:  None. Musculoskeletal:  No suspicious bone lesions identified. IMPRESSION: Small bowel obstruction in left lower quadrant, with features suspicious for a closed loop obstruction possibly due to internal hernia. Surgical consultation is recommended. Severe diffuse colonic diverticulosis, without radiographic evidence of diverticulitis. Electronically Signed   By: Danae Orleans M.D.   On: 09/30/2023 16:00          Sunnie Nielsen, DO [facility] 10/03/2023, 2:48 PM    Dictation software may  have been used to generate the above note. Typos may occur and escape review in typed/dictated notes. Please contact Dr Lyn Hollingshead directly for clarity if needed.  Staff may message me via secure chat in Epic  but this may not receive an immediate response,  please page me for urgent matters!  If 7PM-7AM, please contact night coverage [email or web address]

## 2023-10-03 NOTE — TOC Initial Note (Signed)
Transition of Care [name]) - Initial/Assessment Note    Patient Details  Name: Kristy Sellers MRN: [id number] Date of Birth: [birth year]  Transition of Care [facility]) CM/SW Contact:    Liliana Cline, LCSW Phone Number: 10/03/2023, 12:40 PM  Clinical Narrative:                 Met with patient at bedside to discuss therapy recommendations for HHPT and RW. Patient is from home alone, patient states she is not sure yet if she will return to her home or her daughter's home. She agreed to let [facility] know prior to discharge. Patient states she and her daughter both live in [place], [place]. Patient's PCP is Dr. Judithann Graves. [facility]acy [facility]. Patient states she has a RW she can use. Patient states she drives herself at baseline. Patient is agreeable to HHPT, no agency preferences, address TBD. Referral accepted by Elnita Maxwell with [facility].   Expected Discharge Plan: Home w Home Health Services Barriers to Discharge: Continued Medical Work up   Patient Goals and CMS Choice Patient states their goals for this hospitalization and ongoing recovery are:: home with [facility] CMS Medicare.gov Compare Post Acute Care list provided to:: Patient Choice offered to / list presented to : Patient [place] ownership interest in [facility].provided to:: Patient    Expected Discharge Plan and Services       Living arrangements for the past 2 months: Single Family Home                           HH Arranged: PT HH Agency: [facility] Date [facility] Agency Contacted: 10/03/23   Representative spoke with at [facility] Agency: Elnita Maxwell  Prior Living Arrangements/Services Living arrangements for the past 2 months: Single Family Home Lives with:: Self Patient language and need for interpreter reviewed:: Yes Do you feel safe going back to the place where you live?: Yes      Need for Family Participation in Patient Care: Yes (Comment) Care giver support system in place?: Yes (comment)   Criminal  Activity/Legal Involvement Pertinent to Current Situation/Hospitalization: No - Comment as needed  Activities of Daily Living   ADL Screening (condition at time of admission) Independently performs ADLs?: Yes (appropriate for developmental age) Is the patient deaf or have difficulty hearing?: No Does the patient have difficulty seeing, even when wearing glasses/contacts?: No Does the patient have difficulty concentrating, remembering, or making decisions?: No  Permission Sought/Granted Permission sought to share information with : Facility Industrial/product designer granted to share information with : Yes, Verbal Permission Granted     Permission granted to share info w AGENCY: Home Health agencies        Emotional Assessment       Orientation: : Oriented to Self, Oriented to Situation, Oriented to Place, Oriented to  Time Alcohol / Substance Use: Not Applicable Psych Involvement: No (comment)  Admission diagnosis:  SBO (small bowel obstruction) (HCC) [K56.609] Patient Active Problem List   Diagnosis Date Noted   Small bowel obstruction (HCC) 09/30/2023   Leucocytosis 09/30/2023   Hypokalemia 09/30/2023   HLD (hyperlipidemia) 09/30/2023   Gastroesophageal reflux disease without esophagitis 05/14/2020   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   Chronic lumbar radiculopathy 10/15/2017   Arthritis of knee 10/15/2017   Primary osteoarthritis of left hip 10/15/2017   Mixed hyperlipidemia 06/11/2016   Blind right eye 06/11/2016   History of Clostridium difficile colitis 06/11/2016   History  of positive PPD 06/11/2016   Pseudophakia 05/18/2016   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   Ocular hypertension 05/18/2016   Cataract 05/18/2016   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   Secondary hypotony of eye 09/27/2014   Essential hypertension 09/27/2014   PCP:  Reubin Milan, MD [phone] [place] - [address] [place] [address] Phone:  [phone] Fax: [phone]     Social Determinants of Health (SDOH) Social History: SDOH Screenings   Food Insecurity: No Food Insecurity (10/01/2023)  Housing: Low Risk  (10/01/2023)  Transportation Needs: No Transportation Needs (10/01/2023)  Utilities: Not At Risk (10/01/2023)  Alcohol Screen: Low Risk  (05/10/2021)  Depression (PHQ2-9): Low Risk  (05/22/2023)  Financial Resource Strain: Low Risk  (05/22/2023)  Physical Activity: Sufficiently Active (05/22/2023)  Social Connections: Patient Declined (04/30/2022)  Stress: No Stress Concern Present (05/22/2023)  Tobacco Use: Medium Risk (09/30/2023)   SDOH Interventions:     Readmission Risk Interventions    10/03/2023   12:38 PM  Readmission Risk Prevention Plan  Post Dischage Appt Complete  Medication Screening Complete  Transportation Screening Complete

## 2023-10-03 NOTE — Progress Notes (Signed)
Mobility Specialist - Progress Note   10/03/23 0836  Mobility  Activity Ambulated with assistance in hallway;Stood at bedside;Dangled on edge of bed  Level of Assistance Standby assist, set-up cues, supervision of patient - no hands on  Assistive Device Other (Comment) (Iv Pole)  Distance Ambulated (ft) 320 ft  Activity Response Tolerated well  Mobility Referral Yes  $Mobility charge 1 Mobility  Mobility Specialist Start Time (ACUTE ONLY) 0801  Mobility Specialist Stop Time (ACUTE ONLY) 0824  Mobility Specialist Time Calculation (min) (ACUTE ONLY) 23 min   Pt supine in bed on RA upon arrival. Pt completes bed mobility indep with extra time and use of bed features. Pt STS and ambulates in hallway SBA using IV pole. Pt returns to bed with needs in reach and bed alarm activated.   Terrilyn Saver  Mobility Specialist  10/03/23 8:38 AM

## 2023-10-04 DIAGNOSIS — K56609 Unspecified intestinal obstruction, unspecified as to partial versus complete obstruction: Secondary | ICD-10-CM | POA: Diagnosis not present

## 2023-10-04 LAB — BASIC METABOLIC PANEL
Anion gap: 6 (ref 5–15)
BUN: 18 mg/dL (ref 8–23)
CO2: 25 mmol/L (ref 22–32)
Calcium: 7.8 mg/dL — ABNORMAL LOW (ref 8.9–10.3)
Chloride: 112 mmol/L — ABNORMAL HIGH (ref 98–111)
Creatinine, Ser: 0.69 mg/dL (ref 0.44–1.00)
GFR, Estimated: >60 mL/min (ref >60)
Glucose, Bld: 104 mg/dL — ABNORMAL HIGH (ref 70–99)
Potassium: 2.9 mmol/L — ABNORMAL LOW (ref 3.5–5.1)
Sodium: 143 mmol/L (ref 135–145)

## 2023-10-04 LAB — CBC
HCT: 29.1 % — ABNORMAL LOW (ref 36.0–46.0)
Hemoglobin: 9.2 g/dL — ABNORMAL LOW (ref 12.0–15.0)
MCH: 25.3 pg — ABNORMAL LOW (ref 26.0–34.0)
MCHC: 31.6 g/dL (ref 30.0–36.0)
MCV: 80.2 fL (ref 80.0–100.0)
Platelets: 226 10*3/uL (ref 150–400)
RBC: 3.63 MIL/uL — ABNORMAL LOW (ref 3.87–5.11)
RDW: 16.4 % — ABNORMAL HIGH (ref 11.5–15.5)
WBC: 9.9 10*3/uL (ref 4.0–10.5)
nRBC: 0 % (ref 0.0–0.2)

## 2023-10-04 LAB — MAGNESIUM: Magnesium: 2.4 mg/dL (ref 1.7–2.4)

## 2023-10-04 MED ORDER — ONDANSETRON 8 MG PO TBDP
8.0000 mg | ORAL_TABLET | Freq: Three times a day (TID) | ORAL | Status: DC | PRN
  Administered 2023-10-05: 8 mg via ORAL
  Filled 2023-10-04 (×2): qty 1

## 2023-10-04 MED ORDER — OXYCODONE HCL 5 MG PO TABS
5.0000 mg | ORAL_TABLET | ORAL | Status: DC | PRN
  Filled 2023-10-04: qty 1

## 2023-10-04 MED ORDER — POTASSIUM CHLORIDE CRYS ER 20 MEQ PO TBCR
40.0000 meq | EXTENDED_RELEASE_TABLET | ORAL | Status: DC
  Administered 2023-10-04: 40 meq via ORAL
  Filled 2023-10-04 (×2): qty 2

## 2023-10-04 MED ORDER — FENTANYL CITRATE PF 50 MCG/ML IJ SOSY: 12.5000 ug | PREFILLED_SYRINGE | INTRAMUSCULAR | Status: DC | PRN

## 2023-10-04 MED ORDER — POTASSIUM CHLORIDE CRYS ER 20 MEQ PO TBCR
40.0000 meq | EXTENDED_RELEASE_TABLET | Freq: Once | ORAL | Status: AC
  Administered 2023-10-04: 40 meq via ORAL

## 2023-10-04 NOTE — Progress Notes (Signed)
10/04/2023  Subjective: Patient is 3 Days Post-Op status post exploratory laparotomy with lysis of adhesions and small bowel resection for closed-loop small bowel obstruction.  No acute events overnight.  Her NG tube was removed yesterday without complications.  She has been tolerating a clear liquid diet and is still having flatus.  No bowel movement yet.  Vital signs: Temp:  [98.2 F (36.8 C)-98.9 F (37.2 C)] 98.2 F (36.8 C) (10/06 0759) Pulse Rate:  [67-77] 77 (10/06 0759) Resp:  [14-20] 14 (10/06 0759) BP: (131-142)/(60-73) 142/73 (10/06 0759) SpO2:  [94 %-96 %] 96 % (10/06 0759)   Intake/Output: 10/05 0701 - 10/06 0700 In: 1570.7 [I.V.:1570.7] Out: 150 [Emesis/NG output:150] Last BM Date :  (pta)  Physical Exam: Constitutional: No acute distress Abdomen: Soft, nondistended, probably sore to palpation.  Midline incision is clean, dry, intact without evidence of infection.  Labs:  Recent Labs    10/03/23 0402 10/04/23 0406  WBC 11.8* 9.9  HGB 10.0* 9.2*  HCT 32.3* 29.1*  PLT 220 226   Recent Labs    10/03/23 0402 10/04/23 0406  NA 144 143  K 3.5 2.9*  CL 111 112*  CO2 21* 25  GLUCOSE 62* 104*  BUN 21 18  CREATININE 0.74 0.69  CALCIUM 8.0* 7.8*   No results for input(s): "LABPROT", "INR" in the last 72 hours.  Imaging: No results found.  Assessment/Plan: This is a 82 y.o. female s/p exploratory laparotomy with lysis of adhesions and small bowel resection.  - Patient tolerated NG tube removal yesterday and clear liquid diet initiation.  She continues to have flatus although no bowel movement yet.  She does not have any worsening pain or distention. - Will advance her diet to a full liquid diet.  Discussed with her that we will wait for better bowel function before advancing to a soft diet.   Howie Ill, MD [facility]

## 2023-10-04 NOTE — Plan of Care (Signed)

## 2023-10-04 NOTE — Progress Notes (Signed)
PROGRESS NOTE    Kristy Sellers   LKG:[id number] DOB: 29-Mar-1941  DOA: 09/30/2023 Date of Service: 10/04/23 which is hospital day 4  PCP: Reubin Milan, MD    HPI: Kristy Sellers is a 82 y.o. female with medical history significant of hypertension, hyperlipidemia, GERD, detached retina with right eye blindness, C. difficile colitis, mood disorder, who presents with nausea, vomiting, LLQ abdominal pain and constipation x4 days, several episodes of nonbilious nonbloody vomiting   Hospital course / significant events:  10/02: admitted to hospitalist service for SBO w/ general surgical consult. Started NG but developed worsening pain, plan for surgery.  10/03: overnight, underwent exploratory laparotomy, lysis of adhesions, small bowel resection, partial omentectomy - Dr Maurine Minister. NG tube in place.  NPO and NG tube to low intermittent wall suction, pain control, may need PCA.  10/04: OOB a bit more today 10/05: d/c NG and advance to CLD 10/06: flatus, no BM. Advance to FLD and await better bowel function before advancing to a soft diet.   Consultants:  General Surgery  Procedures/Surgeries: 10/01/23  Exploratory laparotomy, lysis of adhesions, small bowel resection, partial omentectomy - Dr Maurine Minister      ASSESSMENT & PLAN:   SBO (small bowel obstruction) s/p exploratory laparotomy, lysis of adhesions, small bowel resection, partial omentectomy Pain control Full liquid diet Await improved bowel function / BM prior to advance to soft diet  OOB w/ PT/OT    Leucocytosis - improving  Monitor for infectious signs/symptoms, Monitor VS/WBC    Hypokalemia Replace as needed Monitor BMP  Hypoglycemia D50 prn    Essential hypertension - has been normotensive  IV hydralazine as needed Home Amlodipine held for now    HLD (hyperlipidemia) Zocor held until can take po       No concerns based on BMI: Body mass index is 26.3 kg/m.   DVT prophylaxis: SCD IV fluids: d/c today   Nutrition: FLD today  Central lines / invasive devices:  none  Code Status: FULL CODE ACP documentation reviewed: 10/01/23 and none on file in [facility]  TOC needs: home health PT Barriers to dispo / significant pending items: potentially home tomorrow pending surgical clearance and tolerating po / bowel function              Subjective / Brief ROS:  Patient reports feeling hungry Pain is controlled (+)flatus No good BM Denies CP/SOB.  Denies new weakness.    Family Communication: none at this time     Objective Findings:  Vitals:   10/02/23 2358 10/03/23 1805 10/04/23 0030 10/04/23 0759  BP: (!) 124/57 131/60 136/67 (!) 142/73  Pulse: 78 72 67 77  Resp: 18 17 20 14   Temp: 98.9 F (37.2 C) 98.9 F (37.2 C) 98.2 F (36.8 C) 98.2 F (36.8 C)  TempSrc:      SpO2: 94% 96% 94% 96%  Weight:        Intake/Output Summary (Last 24 hours) at 10/04/2023 1427 Last data filed at 10/04/2023 0900 Gross per 24 hour  Intake 1810.72 ml  Output --  Net 1810.72 ml   Filed Weights   09/30/23 1339  Weight: 71.7 kg    Examination:  Physical Exam Constitutional:      General: She is not in acute distress.    Appearance: She is not ill-appearing.  Cardiovascular:     Rate and Rhythm: Normal rate and regular rhythm.  Pulmonary:     Breath sounds: Normal breath sounds.  Abdominal:     General:  Bowel sounds are normal.     Palpations: There is no mass.     Tenderness: There is no guarding or rebound.  Neurological:     General: No focal deficit present.     Mental Status: She is alert and oriented to person, place, and time.  Psychiatric:        Mood and Affect: Mood normal.        Behavior: Behavior normal.          Scheduled Medications:   atropine  1 drop Right Eye Daily   Chlorhexidine Gluconate Cloth  6 each Topical Daily   pantoprazole (PROTONIX) IV  40 mg Intravenous Q24H   potassium chloride  40 mEq Oral Q4H   prednisoLONE acetate  1 drop Right  Eye Daily    Continuous Infusions:    PRN Medications:  fentaNYL (SUBLIMAZE) injection, hydrALAZINE, ondansetron (ZOFRAN) IV, mouth rinse, oxyCODONE  Antimicrobials from admission:  Anti-infectives (From admission, onward)    Start     Dose/Rate Route Frequency Ordered Stop   10/01/23 0030  cefoTEtan (CEFOTAN) 1 g in sodium chloride 0.9 % 100 mL IVPB        1 g 200 mL/hr over 30 Minutes Intravenous  Once 10/01/23 0015 10/01/23 0134           Data Reviewed:  I have personally reviewed the following...  CBC: Recent Labs  Lab 09/30/23 1345 10/01/23 0522 10/03/23 0402 10/04/23 0406  WBC 16.4* 13.3* 11.8* 9.9  HGB 12.4 12.7 10.0* 9.2*  HCT 41.0 40.3 32.3* 29.1*  MCV 84.0 80.8 81.4 80.2  PLT 318 279 220 226   Basic Metabolic Panel: Recent Labs  Lab 09/30/23 1345 10/01/23 0522 10/03/23 0402 10/04/23 0406  NA 140 142 144 143  K 3.4* 3.4* 3.5 2.9*  CL 106 109 111 112*  CO2 25 24 21* 25  GLUCOSE 121* 146* 62* 104*  BUN 16 13 21 18   CREATININE 0.77 0.75 0.74 0.69  CALCIUM 8.8* 8.3* 8.0* 7.8*  MG 2.3  --   --  2.4   GFR: Estimated Creatinine Clearance: 53.8 mL/min (by C-G formula based on SCr of 0.69 mg/dL). Liver Function Tests: Recent Labs  Lab 09/30/23 1345  AST 20  ALT 12  ALKPHOS 59  BILITOT 0.8  PROT 7.5  ALBUMIN 3.7   Recent Labs  Lab 09/30/23 1345  LIPASE 21   No results for input(s): "AMMONIA" in the last 168 hours. Coagulation Profile: No results for input(s): "INR", "PROTIME" in the last 168 hours. Cardiac Enzymes: No results for input(s): "CKTOTAL", "CKMB", "CKMBINDEX", "TROPONINI" in the last 168 hours. BNP (last 3 results) No results for input(s): "PROBNP" in the last 8760 hours. HbA1C: No results for input(s): "HGBA1C" in the last 72 hours. CBG: Recent Labs  Lab 10/01/23 0453 10/01/23 0745 10/02/23 0810 10/03/23 1145  GLUCAP 134* 147* 82 68*   Lipid Profile: No results for input(s): "CHOL", "HDL", "LDLCALC", "TRIG",  "CHOLHDL", "LDLDIRECT" in the last 72 hours. Thyroid Function Tests: No results for input(s): "TSH", "T4TOTAL", "FREET4", "T3FREE", "THYROIDAB" in the last 72 hours. Anemia Panel: No results for input(s): "VITAMINB12", "FOLATE", "FERRITIN", "TIBC", "IRON", "RETICCTPCT" in the last 72 hours. Most Recent Urinalysis On File:     Component Value Date/Time   COLORURINE YELLOW (A) 09/30/2023 1345   APPEARANCEUR HAZY (A) 09/30/2023 1345   APPEARANCEUR CLEAR 01/09/2015 0938   LABSPEC 1.027 09/30/2023 1345   LABSPEC 1.010 01/09/2015 0938   PHURINE 8.0 09/30/2023 1345   GLUCOSEU  NEGATIVE 09/30/2023 1345   GLUCOSEU NEGATIVE 01/09/2015 0938   HGBUR NEGATIVE 09/30/2023 1345   BILIRUBINUR NEGATIVE 09/30/2023 1345   BILIRUBINUR neg 11/03/2022 0927   BILIRUBINUR NEGATIVE 01/09/2015 0938   KETONESUR 5 (A) 09/30/2023 1345   PROTEINUR NEGATIVE 09/30/2023 1345   UROBILINOGEN 0.2 11/03/2022 0927   NITRITE NEGATIVE 09/30/2023 1345   LEUKOCYTESUR NEGATIVE 09/30/2023 1345   LEUKOCYTESUR MODERATE 01/09/2015 0938   Sepsis Labs: @LABRCNTIP (procalcitonin:4,lacticidven:4) Microbiology: No results found for this or any previous visit (from the past 240 hour(s)).    Radiology Studies last 3 days: DG Abd Portable 1 View  Result Date: 09/30/2023 CLINICAL DATA:  NG tube placement EXAM: PORTABLE ABDOMEN - 1 VIEW COMPARISON:  Same day CT abdomen and pelvis FINDINGS: Enteric tube tip and side-port in the stomach. Remainder unchanged from same day CT abdomen and pelvis given differences in technique. IMPRESSION: Enteric tube tip and side-port in the stomach. Electronically Signed   By: Minerva Fester M.D.   On: 09/30/2023 19:15   CT ABDOMEN PELVIS W CONTRAST  Result Date: 09/30/2023 CLINICAL DATA:  Abdominal pain, nausea, and vomiting for several days. Constipation. EXAM: CT ABDOMEN AND PELVIS WITH CONTRAST TECHNIQUE: Multidetector CT imaging of the abdomen and pelvis was performed using the standard protocol  following bolus administration of intravenous contrast. RADIATION DOSE REDUCTION: This exam was performed according to the departmental dose-optimization program which includes automated exposure control, adjustment of the mA and/or kV according to patient size and/or use of iterative reconstruction technique. CONTRAST:  OMNIPAQUE IOHEXOL 300 MG/ML  SOLN COMPARISON:  09/06/2015 FINDINGS: Lower Chest: No acute findings. Hepatobiliary: Several benign-appearing hepatic cysts are again noted. No suspicious hepatic masses identified. Gallbladder is unremarkable. No evidence of biliary ductal dilatation. Pancreas:  No mass or inflammatory changes. Spleen: Within normal limits in size and appearance. Adrenals/Urinary Tract: No suspicious masses identified. No evidence of ureteral calculi or hydronephrosis. Stomach/Bowel: Several fluid-filled moderately dilated small bowel loops are seen which are localized to the left lower quadrant, with a swirling or twisting pattern in the central mesentery. This is consistent with a small bowel obstruction, and is suspicious for a closed loop obstruction possibly due to internal hernia. No evidence of bowel wall thickening or pneumatosis. No evidence of free intraperitoneal air or abscess. Diffuse severe colonic diverticulosis is noted, however, there is no evidence of diverticulitis. Normal appendix visualized. Vascular/Lymphatic: No pathologically enlarged lymph nodes. No acute vascular findings. Reproductive: Prior hysterectomy noted. Adnexal regions are unremarkable in appearance. Other:  None. Musculoskeletal:  No suspicious bone lesions identified. IMPRESSION: Small bowel obstruction in left lower quadrant, with features suspicious for a closed loop obstruction possibly due to internal hernia. Surgical consultation is recommended. Severe diffuse colonic diverticulosis, without radiographic evidence of diverticulitis. Electronically Signed   By: Danae Orleans M.D.   On:  09/30/2023 16:00          Sunnie Nielsen, DO [facility] 10/04/2023, 2:27 PM    Dictation software may have been used to generate the above note. Typos may occur and escape review in typed/dictated notes. Please contact Dr Lyn Hollingshead directly for clarity if needed.  Staff may message me via secure chat in Epic  but this may not receive an immediate response,  please page me for urgent matters!  If 7PM-7AM, please contact night coverage [email or web address]

## 2023-10-05 DIAGNOSIS — K56609 Unspecified intestinal obstruction, unspecified as to partial versus complete obstruction: Secondary | ICD-10-CM | POA: Diagnosis not present

## 2023-10-05 LAB — CBC
HCT: 29.7 % — ABNORMAL LOW (ref 36.0–46.0)
Hemoglobin: 9.4 g/dL — ABNORMAL LOW (ref 12.0–15.0)
MCH: 25.8 pg — ABNORMAL LOW (ref 26.0–34.0)
MCHC: 31.6 g/dL (ref 30.0–36.0)
MCV: 81.6 fL (ref 80.0–100.0)
Platelets: 259 10*3/uL (ref 150–400)
RBC: 3.64 MIL/uL — ABNORMAL LOW (ref 3.87–5.11)
RDW: 16.3 % — ABNORMAL HIGH (ref 11.5–15.5)
WBC: 8.4 10*3/uL (ref 4.0–10.5)
nRBC: 0 % (ref 0.0–0.2)

## 2023-10-05 LAB — BASIC METABOLIC PANEL
Anion gap: 8 (ref 5–15)
BUN: 13 mg/dL (ref 8–23)
CO2: 26 mmol/L (ref 22–32)
Calcium: 7.9 mg/dL — ABNORMAL LOW (ref 8.9–10.3)
Chloride: 109 mmol/L (ref 98–111)
Creatinine, Ser: 0.71 mg/dL (ref 0.44–1.00)
GFR, Estimated: >60 mL/min (ref >60)
Glucose, Bld: 92 mg/dL (ref 70–99)
Potassium: 3.7 mmol/L (ref 3.5–5.1)
Sodium: 143 mmol/L (ref 135–145)

## 2023-10-05 LAB — GLUCOSE, CAPILLARY
Glucose-Capillary: 107 mg/dL — ABNORMAL HIGH (ref 70–99)
Glucose-Capillary: 95 mg/dL (ref 70–99)

## 2023-10-05 MED ORDER — SIMVASTATIN 20 MG PO TABS
10.0000 mg | ORAL_TABLET | Freq: Every day | ORAL | Status: DC
  Administered 2023-10-05 – 2023-10-07 (×3): 10 mg via ORAL
  Filled 2023-10-05 (×3): qty 1

## 2023-10-05 MED ORDER — SENNA 8.6 MG PO TABS
1.0000 | ORAL_TABLET | Freq: Every day | ORAL | Status: DC
  Administered 2023-10-05 – 2023-10-07 (×3): 8.6 mg via ORAL
  Filled 2023-10-05 (×3): qty 1

## 2023-10-05 MED ORDER — FLEET ENEMA RE ENEM: 1.0000 | ENEMA | Freq: Every day | RECTAL | Status: DC | PRN

## 2023-10-05 MED ORDER — BISACODYL 10 MG RE SUPP
10.0000 mg | Freq: Once | RECTAL | Status: AC
  Administered 2023-10-05: 10 mg via RECTAL

## 2023-10-05 MED ORDER — DOCUSATE SODIUM 100 MG PO CAPS
100.0000 mg | ORAL_CAPSULE | Freq: Two times a day (BID) | ORAL | Status: DC
  Administered 2023-10-05 – 2023-10-08 (×7): 100 mg via ORAL
  Filled 2023-10-05 (×7): qty 1

## 2023-10-05 MED ORDER — AMLODIPINE BESYLATE 5 MG PO TABS
5.0000 mg | ORAL_TABLET | Freq: Every day | ORAL | Status: DC
  Administered 2023-10-05: 5 mg via ORAL
  Filled 2023-10-05: qty 1

## 2023-10-05 MED ORDER — BISACODYL 10 MG RE SUPP
10.0000 mg | Freq: Every day | RECTAL | Status: DC | PRN
  Administered 2023-10-05: 10 mg via RECTAL
  Filled 2023-10-05: qty 1

## 2023-10-05 NOTE — Progress Notes (Signed)
PROGRESS NOTE    Kristy Sellers   ZOX:[id number] DOB: 1941-10-19  DOA: 09/30/2023 Date of Service: 10/05/23 which is hospital day 5  PCP: Reubin Milan, MD    HPI: Kristy Sellers is a 82 y.o. female with medical history significant of hypertension, hyperlipidemia, GERD, detached retina with right eye blindness, C. difficile colitis, mood disorder, who presents with nausea, vomiting, LLQ abdominal pain and constipation x4 days, several episodes of nonbilious nonbloody vomiting   Hospital course / significant events:  10/02: admitted to hospitalist service for SBO w/ general surgical consult. Started NG but developed worsening pain, plan for surgery.  10/03: overnight, underwent exploratory laparotomy, lysis of adhesions, small bowel resection, partial omentectomy - Dr Maurine Minister. NG tube in place.  NPO and NG tube to low intermittent wall suction, pain control, may need PCA.  10/04: OOB a bit more today 10/05: d/c NG and advance to CLD 10/06: flatus, no BM. Advance to FLD and await better bowel function before advancing to a soft diet.  10/07: small BM, pt feeling constipation, hopefully can advance to soft diet later today   Consultants:  General Surgery  Procedures/Surgeries: 10/01/23  Exploratory laparotomy, lysis of adhesions, small bowel resection, partial omentectomy - Dr Maurine Minister      ASSESSMENT & PLAN:   SBO (small bowel obstruction) s/p exploratory laparotomy, lysis of adhesions, small bowel resection, partial omentectomy Pain control Full liquid diet Await improved bowel function / BM prior to advance to soft diet  OOB ad lib / PT/OT as needed    Leucocytosis - improving  Monitor for infectious signs/symptoms, Monitor VS/WBC    Hypokalemia Replace as needed Monitor BMP  Hypoglycemia D50 prn    Essential hypertension - has been normotensive  IV hydralazine as needed Home Amlodipine held initially but BP up today, can restart    HLD (hyperlipidemia) Zocor  restarted       No concerns based on BMI: Body mass index is 26.3 kg/m.   DVT prophylaxis: SCD IV fluids: d/c today  Nutrition: FLD today  Central lines / invasive devices:  none  Code Status: FULL CODE ACP documentation reviewed: 10/01/23 and none on file in [facility]  TOC needs: home health PT Barriers to dispo / significant pending items: potentially home tomorrow pending surgical clearance and tolerating po / bowel function              Subjective / Brief ROS:  Patient reports feeling constipation  Pain is controlled (+)flatus, very small BM yesterday  Denies CP/SOB.  Denies new weakness.    Family Communication: none at this time     Objective Findings:  Vitals:   10/03/23 1805 10/04/23 0030 10/04/23 0759 10/05/23 0825  BP: 131/60 136/67 (!) 142/73 (!) 140/74  Pulse: 72 67 77 66  Resp: 17 20 14 17   Temp: 98.9 F (37.2 C) 98.2 F (36.8 C) 98.2 F (36.8 C) 98.8 F (37.1 C)  TempSrc:      SpO2: 96% 94% 96% 96%  Weight:        Intake/Output Summary (Last 24 hours) at 10/05/2023 1235 Last data filed at 10/05/2023 1028 Gross per 24 hour  Intake 661.57 ml  Output --  Net 661.57 ml   Filed Weights   09/30/23 1339  Weight: 71.7 kg    Examination:  Physical Exam Constitutional:      General: She is not in acute distress.    Appearance: She is not ill-appearing.  Cardiovascular:     Rate and Rhythm:  Normal rate and regular rhythm.  Pulmonary:     Breath sounds: Normal breath sounds.  Abdominal:     General: Bowel sounds are normal.     Palpations: There is no mass.     Tenderness: There is no guarding or rebound.  Neurological:     General: No focal deficit present.     Mental Status: She is alert and oriented to person, place, and time.  Psychiatric:        Mood and Affect: Mood normal.        Behavior: Behavior normal.          Scheduled Medications:   amLODipine  5 mg Oral QHS   atropine  1 drop Right Eye Daily   docusate  sodium  100 mg Oral BID   prednisoLONE acetate  1 drop Right Eye Daily   senna  1 tablet Oral QHS   simvastatin  10 mg Oral QHS    Continuous Infusions:    PRN Medications:  ondansetron, mouth rinse, oxyCODONE, sodium phosphate  Antimicrobials from admission:  Anti-infectives (From admission, onward)    Start     Dose/Rate Route Frequency Ordered Stop   10/01/23 0030  cefoTEtan (CEFOTAN) 1 g in sodium chloride 0.9 % 100 mL IVPB        1 g 200 mL/hr over 30 Minutes Intravenous  Once 10/01/23 0015 10/01/23 0134           Data Reviewed:  I have personally reviewed the following...  CBC: Recent Labs  Lab 09/30/23 1345 10/01/23 0522 10/03/23 0402 10/04/23 0406 10/05/23 0319  WBC 16.4* 13.3* 11.8* 9.9 8.4  HGB 12.4 12.7 10.0* 9.2* 9.4*  HCT 41.0 40.3 32.3* 29.1* 29.7*  MCV 84.0 80.8 81.4 80.2 81.6  PLT 318 279 220 226 259   Basic Metabolic Panel: Recent Labs  Lab 09/30/23 1345 10/01/23 0522 10/03/23 0402 10/04/23 0406 10/05/23 0319  NA 140 142 144 143 143  K 3.4* 3.4* 3.5 2.9* 3.7  CL 106 109 111 112* 109  CO2 25 24 21* 25 26  GLUCOSE 121* 146* 62* 104* 92  BUN 16 13 21 18 13   CREATININE 0.77 0.75 0.74 0.69 0.71  CALCIUM 8.8* 8.3* 8.0* 7.8* 7.9*  MG 2.3  --   --  2.4  --    GFR: Estimated Creatinine Clearance: 53.8 mL/min (by C-G formula based on SCr of 0.71 mg/dL). Liver Function Tests: Recent Labs  Lab 09/30/23 1345  AST 20  ALT 12  ALKPHOS 59  BILITOT 0.8  PROT 7.5  ALBUMIN 3.7   Recent Labs  Lab 09/30/23 1345  LIPASE 21   No results for input(s): "AMMONIA" in the last 168 hours. Coagulation Profile: No results for input(s): "INR", "PROTIME" in the last 168 hours. Cardiac Enzymes: No results for input(s): "CKTOTAL", "CKMB", "CKMBINDEX", "TROPONINI" in the last 168 hours. BNP (last 3 results) No results for input(s): "PROBNP" in the last 8760 hours. HbA1C: No results for input(s): "HGBA1C" in the last 72 hours. CBG: Recent Labs   Lab 10/01/23 0453 10/01/23 0745 10/02/23 0810 10/03/23 1145 10/05/23 0822  GLUCAP 134* 147* 82 68* 107*   Lipid Profile: No results for input(s): "CHOL", "HDL", "LDLCALC", "TRIG", "CHOLHDL", "LDLDIRECT" in the last 72 hours. Thyroid Function Tests: No results for input(s): "TSH", "T4TOTAL", "FREET4", "T3FREE", "THYROIDAB" in the last 72 hours. Anemia Panel: No results for input(s): "VITAMINB12", "FOLATE", "FERRITIN", "TIBC", "IRON", "RETICCTPCT" in the last 72 hours. Most Recent Urinalysis On File:  Component Value Date/Time   COLORURINE YELLOW (A) 09/30/2023 1345   APPEARANCEUR HAZY (A) 09/30/2023 1345   APPEARANCEUR CLEAR 01/09/2015 0938   LABSPEC 1.027 09/30/2023 1345   LABSPEC 1.010 01/09/2015 0938   PHURINE 8.0 09/30/2023 1345   GLUCOSEU NEGATIVE 09/30/2023 1345   GLUCOSEU NEGATIVE 01/09/2015 0938   HGBUR NEGATIVE 09/30/2023 1345   BILIRUBINUR NEGATIVE 09/30/2023 1345   BILIRUBINUR neg 11/03/2022 0927   BILIRUBINUR NEGATIVE 01/09/2015 0938   KETONESUR 5 (A) 09/30/2023 1345   PROTEINUR NEGATIVE 09/30/2023 1345   UROBILINOGEN 0.2 11/03/2022 0927   NITRITE NEGATIVE 09/30/2023 1345   LEUKOCYTESUR NEGATIVE 09/30/2023 1345   LEUKOCYTESUR MODERATE 01/09/2015 0938   Sepsis Labs: @LABRCNTIP (procalcitonin:4,lacticidven:4) Microbiology: No results found for this or any previous visit (from the past 240 hour(s)).    Radiology Studies last 3 days: No results found.        Sunnie Nielsen, DO [facility] 10/05/2023, 12:35 PM    Dictation software may have been used to generate the above note. Typos may occur and escape review in typed/dictated notes. Please contact Dr Lyn Hollingshead directly for clarity if needed.  Staff may message me via secure chat in Epic  but this may not receive an immediate response,  please page me for urgent matters!  If 7PM-7AM, please contact night coverage [email or web address]

## 2023-10-05 NOTE — Plan of Care (Signed)

## 2023-10-05 NOTE — Care Management Important Message (Signed)
Important Message  Patient Details  Name: Kristy Sellers MRN: [id number] Date of Birth: 17-Mar-1941   Important Message Given:  Yes - Medicare IM     Bernadette Hoit 10/05/2023, 3:25 PM

## 2023-10-05 NOTE — Progress Notes (Signed)
Physical Therapy Treatment Patient Details Name: Kristy Sellers MRN: [id number] DOB: May 08, 1941 Today's Date: 10/05/2023   History of Present Illness presented to ER secondary to nausea/vomiting, abdominal pain; admitted for management of SBO, s/p ex-lap, LOA, small bowel resection and partial omenectomy (10/3)    PT Comments  Pt seen for PT tx with pt agreeable. Pt is able to ambulate 2 laps around nurses station without AD with supervision fade to mod I with pt noting she's walking slower, only reaching for rail 1 time, no overt LOB. Pt negotiates 4 steps with B rails & mod I with step to pattern. Pt is making good progress with mobility. Recommend pt continue ambulating on her own or with mobility specialists but no further acute PT services required. PT to complete current orders.     If plan is discharge home, recommend the following: Assistance with cooking/housework   Can travel by private vehicle        Equipment Recommendations  None recommended by PT    Recommendations for Other Services       Precautions / Restrictions Precautions Precautions: Fall Restrictions Weight Bearing Restrictions: No     Mobility  Bed Mobility Overal bed mobility: Modified Independent Bed Mobility: Supine to Sit     Supine to sit: Modified independent (Device/Increase time), HOB elevated          Transfers   Equipment used: None Transfers: Sit to/from Stand Sit to Stand: Modified independent (Device/Increase time)                Ambulation/Gait Ambulation/Gait assistance: Supervision, Modified independent (Device/Increase time) Gait Distance (Feet):  (350) Assistive device: None   Gait velocity: decreased     General Gait Details: decreased R knee flexion during swing phase   Stairs Stairs: Yes Stairs assistance: Modified independent (Device/Increase time) Stair Management: Two rails, Step to pattern Number of Stairs: 4 (6") General stair comments: Pt reports she  thinks she may have a rail on B sides at home so utilized B rails   Wheelchair Mobility     Tilt Bed    Modified Rankin (Stroke Patients Only)       Balance Overall balance assessment: Mild deficits observed, not formally tested   Sitting balance-Leahy Scale: Good     Standing balance support: No upper extremity supported, During functional activity Standing balance-Leahy Scale: Good                              Cognition Arousal: Alert Behavior During Therapy: WFL for tasks assessed/performed Overall Cognitive Status: Within Functional Limits for tasks assessed                                 General Comments: very pleasant, motivated to participate        Exercises      General Comments General comments (skin integrity, edema, etc.): educated pt on d/c from PT caseload but can ask for pt to be seen by mobility specialists      Pertinent Vitals/Pain Pain Assessment Pain Assessment: No/denies pain (pt notes soreness 2/2 being bloated, when asked to rate her pain pt states "I wouldn't rate it")    Home Living                          Prior Function  PT Goals (current goals can now be found in the care plan section) Acute Rehab PT Goals Patient Stated Goal: to return home PT Goal Formulation: With patient/family Time For Goal Achievement: 10/16/23 Potential to Achieve Goals: Good Progress towards PT goals: Goals met/education completed, patient discharged from PT    Frequency    Min 1X/week      PT Plan      Co-evaluation              AM-PAC PT "6 Clicks" Mobility   Outcome Measure  Help needed turning from your back to your side while in a flat bed without using bedrails?: None Help needed moving from lying on your back to sitting on the side of a flat bed without using bedrails?: None Help needed moving to and from a bed to a chair (including a wheelchair)?: None Help needed standing up  from a chair using your arms (e.g., wheelchair or bedside chair)?: None Help needed to walk in hospital room?: None Help needed climbing 3-5 steps with a railing? : None 6 Click Score: 24    End of Session   Activity Tolerance: Patient tolerated treatment well Patient left: in bed;with call bell/phone within reach   PT Visit Diagnosis: Muscle weakness (generalized) (M62.81);Other abnormalities of gait and mobility (R26.89)     Time: 8657-8469 PT Time Calculation (min) (ACUTE ONLY): 9 min  Charges:    $Therapeutic Activity: 8-22 mins PT General Charges $$ ACUTE PT VISIT: 1 Visit                     Aleda Grana, PT, DPT 10/05/23, 4:17 PM    Sandi Mariscal 10/05/2023, 4:16 PM

## 2023-10-05 NOTE — Progress Notes (Signed)
10/05/2023  Subjective: Patient is 4 Days Post-Op status post exploratory laparotomy with lysis of adhesions and small bowel resection for closed-loop small bowel obstruction.  No acute events overnight.  She tolerated FLD yesterday. Seh reports feeling constipated, continues to pass flatus.   Vital signs: Temp:  [98.8 F (37.1 C)] 98.8 F (37.1 C) (10/07 0825) Pulse Rate:  [66] 66 (10/07 0825) Resp:  [17] 17 (10/07 0825) BP: (140)/(74) 140/74 (10/07 0825) SpO2:  [96 %] 96 % (10/07 0825)   Intake/Output: 10/06 0701 - 10/07 0700 In: 781.6 [P.O.:480; I.V.:301.6] Out: -  Last BM Date : 10/04/23  Physical Exam: Constitutional: No acute distress Abdomen: Soft, nondistended, minimal pain to palpation  Midline incision is clean, dry, intact without evidence of infection.  Labs:  Recent Labs    10/04/23 0406 10/05/23 0319  WBC 9.9 8.4  HGB 9.2* 9.4*  HCT 29.1* 29.7*  PLT 226 259   Recent Labs    10/04/23 0406 10/05/23 0319  NA 143 143  K 2.9* 3.7  CL 112* 109  CO2 25 26  GLUCOSE 104* 92  BUN 18 13  CREATININE 0.69 0.71  CALCIUM 7.8* 7.9*   No results for input(s): "LABPROT", "INR" in the last 72 hours.  Imaging: No results found.  Assessment/Plan: This is a 82 y.o. female s/p exploratory laparotomy with lysis of adhesions and small bowel resection.  - Patient reports feeling constipated. She is tolerating FLD and having flatus. Will give suppository today, add bowel regimen.  --Ambulate and IS

## 2023-10-06 DIAGNOSIS — K56609 Unspecified intestinal obstruction, unspecified as to partial versus complete obstruction: Secondary | ICD-10-CM | POA: Diagnosis not present

## 2023-10-06 LAB — BASIC METABOLIC PANEL
Anion gap: 11 (ref 5–15)
BUN: 12 mg/dL (ref 8–23)
CO2: 28 mmol/L (ref 22–32)
Calcium: 8.5 mg/dL — ABNORMAL LOW (ref 8.9–10.3)
Chloride: 102 mmol/L (ref 98–111)
Creatinine, Ser: 0.8 mg/dL (ref 0.44–1.00)
GFR, Estimated: >60 mL/min (ref >60)
Glucose, Bld: 112 mg/dL — ABNORMAL HIGH (ref 70–99)
Potassium: 3.4 mmol/L — ABNORMAL LOW (ref 3.5–5.1)
Sodium: 141 mmol/L (ref 135–145)

## 2023-10-06 LAB — GLUCOSE, CAPILLARY: Glucose-Capillary: 119 mg/dL — ABNORMAL HIGH (ref 70–99)

## 2023-10-06 LAB — CBC
HCT: 36.2 % (ref 36.0–46.0)
Hemoglobin: 11.5 g/dL — ABNORMAL LOW (ref 12.0–15.0)
MCH: 25.6 pg — ABNORMAL LOW (ref 26.0–34.0)
MCHC: 31.8 g/dL (ref 30.0–36.0)
MCV: 80.6 fL (ref 80.0–100.0)
Platelets: 352 10*3/uL (ref 150–400)
RBC: 4.49 MIL/uL (ref 3.87–5.11)
RDW: 16.3 % — ABNORMAL HIGH (ref 11.5–15.5)
WBC: 9.5 10*3/uL (ref 4.0–10.5)
nRBC: 0 % (ref 0.0–0.2)

## 2023-10-06 MED ORDER — POTASSIUM CHLORIDE CRYS ER 20 MEQ PO TBCR
20.0000 meq | EXTENDED_RELEASE_TABLET | Freq: Once | ORAL | Status: AC
  Administered 2023-10-06: 20 meq via ORAL
  Filled 2023-10-06: qty 1

## 2023-10-06 MED ORDER — AMLODIPINE BESYLATE 10 MG PO TABS
10.0000 mg | ORAL_TABLET | Freq: Every day | ORAL | Status: DC
  Administered 2023-10-06 – 2023-10-08 (×3): 10 mg via ORAL
  Filled 2023-10-06 (×3): qty 1

## 2023-10-06 NOTE — Plan of Care (Signed)
  Problem: Clinical Measurements: Goal: Ability to maintain clinical measurements within normal limits will improve Outcome: Progressing   Problem: Nutrition: Goal: Adequate nutrition will be maintained Outcome: Progressing   Problem: Elimination: Goal: Will not experience complications related to bowel motility Outcome: Progressing   Problem: Pain Managment: Goal: General experience of comfort will improve Outcome: Progressing   Problem: Safety: Goal: Ability to remain free from injury will improve Outcome: Progressing

## 2023-10-06 NOTE — Progress Notes (Signed)
[facility] SURGICAL PROGRESS NOTE  Hospital Day(s): 6.   Post op day(s): 5 Days Post-Op.   Interval History:  Patient seen and examined No acute events or new complaints overnight.  Patient reports she is doing okay; very fatigue after doing her morning routine No fever, chills, nausea, emesis No new labs this morning She is passing flatus Still awaiting BM Ambulating   Vital signs in last 24 hours: [min-max] current  Temp:  [98.8 F (37.1 C)-99.4 F (37.4 C)] 98.8 F (37.1 C) (10/08 0803) Pulse Rate:  [56-72] 56 (10/08 0803) Resp:  [16-18] 16 (10/08 0803) BP: (107-142)/(59-74) 107/59 (10/08 0803) SpO2:  [96 %-99 %] 98 % (10/08 0803)       Weight: 71.7 kg     Intake/Output last 2 shifts:  10/07 0701 - 10/08 0700 In: 120 [P.O.:120] Out: -    Physical Exam:  Constitutional: alert, cooperative and no distress  Respiratory: breathing non-labored at rest  Cardiovascular: regular rate and sinus rhythm  Gastrointestinal: soft, non-tender, and non-distended, no rebound/guarding Integumentary: Laparotomy is intact with staples; no erythema or drainage   Labs:     Latest Ref Rng & Units 10/05/2023    3:19 AM 10/04/2023    4:06 AM 10/03/2023    4:02 AM  CBC  WBC 4.0 - 10.5 K/uL 8.4  9.9  11.8   Hemoglobin 12.0 - 15.0 g/dL 9.4  9.2  69.6   Hematocrit 36.0 - 46.0 % 29.7  29.1  32.3   Platelets 150 - 400 K/uL 259  226  220       Latest Ref Rng & Units 10/05/2023    3:19 AM 10/04/2023    4:06 AM 10/03/2023    4:02 AM  CMP  Glucose 70 - 99 mg/dL 92  295  62   BUN 8 - 23 mg/dL 13  18  21    Creatinine 0.44 - 1.00 mg/dL 2.84  1.32  4.40   Sodium 135 - 145 mmol/L 143  143  144   Potassium 3.5 - 5.1 mmol/L 3.7  2.9  3.5   Chloride 98 - 111 mmol/L 109  112  111   CO2 22 - 32 mmol/L 26  25  21    Calcium 8.9 - 10.3 mg/dL 7.9  7.8  8.0      Imaging studies: No new pertinent imaging studies   Assessment/Plan:  82 y.o. female awaiting return of bowel function   5 Days Post-Op s/p exploratory laparotomy and small bowel resection for closed loop obstruction.   - Will continue FLD today  - Okay to continue bowel regimen - Monitor abdominal examination; on-going bowel function   - Pain control prn; antiemetics prn   - Mobilize  - Further management per primary service; we will follow     - Discharge Planning: Awaiting BM. Otherwise doing well.   All of the above findings and recommendations were discussed with the patient, and the medical team, and all of patient's and family's questions were answered to their expressed satisfaction.  -- Lynden Oxford, PA-C [facility] 10/06/2023, 8:03 AM M-F: 7am - 4pm

## 2023-10-06 NOTE — Plan of Care (Signed)

## 2023-10-06 NOTE — Progress Notes (Signed)
PROGRESS NOTE    Kristy Sellers   BJY:[id number] DOB: 10/10/1941  DOA: 09/30/2023 Date of Service: 10/06/23 which is hospital day 6  PCP: Reubin Milan, MD    HPI: Kristy Sellers is a 82 y.o. female with medical history significant of hypertension, hyperlipidemia, GERD, detached retina with right eye blindness, C. difficile colitis, mood disorder, who presents with nausea, vomiting, LLQ abdominal pain and constipation x4 days, several episodes of nonbilious nonbloody vomiting   Hospital course / significant events:  10/02: admitted to hospitalist service for SBO w/ general surgical consult. Started NG but developed worsening pain, plan for surgery.  10/03: overnight, underwent exploratory laparotomy, lysis of adhesions, small bowel resection, partial omentectomy - Dr Maurine Minister. NG tube in place.  NPO and NG tube to low intermittent wall suction, pain control, may need PCA.  10/04: OOB a bit more today 10/05: d/c NG and advance to CLD 10/06: flatus, no BM. Advance to FLD and await better bowel function before advancing to a soft diet.  10/07: small BM, pt feeling constipation, hopefully can advance to soft diet later today  10/08: still no significant BM, maintain FLD for now  Consultants:  General Surgery  Procedures/Surgeries: 10/01/23  Exploratory laparotomy, lysis of adhesions, small bowel resection, partial omentectomy - Dr Maurine Minister      ASSESSMENT & PLAN:   SBO (small bowel obstruction) POD5 on 10/06/23 s/p exploratory laparotomy, lysis of adhesions, small bowel resection, partial omentectomy Pain control  Full liquid diet - continue today per gen surg. Await more substantial BM prior to advance to soft diet  OOB ad lib, PT/OT as needed    Leucocytosis - improving  Monitor for infectious signs/symptoms, Monitor VS/WBC    Hypokalemia Replace as needed Monitor BMP  Hypoglycemia D50 prn    Essential hypertension - has been normotensive  IV hydralazine as needed Home  Amlodipine   HLD (hyperlipidemia) Zocor restarted       No concerns based on BMI: Body mass index is 26.3 kg/m.   DVT prophylaxis: SCD IV fluids: d/c today  Nutrition: FLD today  Central lines / invasive devices:  none  Code Status: FULL CODE ACP documentation reviewed: 10/01/23 and none on file in [facility]  TOC needs: home health PT Barriers to dispo / significant pending items: potentially home tomorrow pending surgical clearance and tolerating po / bowel function              Subjective / Brief ROS:  Patient reports feeling constipation but less so than yesterday Walking okay but getting tired easily  Pain is controlled (+)flatus Denies CP/SOB.  Denies new weakness.    Family Communication: none at this time     Objective Findings:  Vitals:   10/05/23 0825 10/05/23 1757 10/05/23 2102 10/06/23 0803  BP: (!) 140/74 (!) 142/67 136/68 (!) 107/59  Pulse: 66 66 72 (!) 56  Resp: 17 18 16 16   Temp: 98.8 F (37.1 C)  99.4 F (37.4 C) 98.8 F (37.1 C)  TempSrc:    Oral  SpO2: 96% 99% 96% 98%  Weight:       No intake or output data in the 24 hours ending 10/06/23 1222  Filed Weights   09/30/23 1339  Weight: 71.7 kg    Examination:  Physical Exam Constitutional:      General: She is not in acute distress.    Appearance: She is not ill-appearing.  Cardiovascular:     Rate and Rhythm: Normal rate and regular rhythm.  Pulmonary:  Breath sounds: Normal breath sounds.  Abdominal:     General: Bowel sounds are normal.     Palpations: There is no mass.     Tenderness: There is no guarding or rebound.  Neurological:     General: No focal deficit present.     Mental Status: She is alert and oriented to person, place, and time.  Psychiatric:        Mood and Affect: Mood normal.        Behavior: Behavior normal.          Scheduled Medications:   amLODipine  10 mg Oral Daily   atropine  1 drop Right Eye Daily   docusate sodium  100 mg Oral  BID   potassium chloride  20 mEq Oral Once   prednisoLONE acetate  1 drop Right Eye Daily   senna  1 tablet Oral QHS   simvastatin  10 mg Oral QHS    Continuous Infusions:    PRN Medications:  ondansetron, mouth rinse, oxyCODONE, sodium phosphate  Antimicrobials from admission:  Anti-infectives (From admission, onward)    Start     Dose/Rate Route Frequency Ordered Stop   10/01/23 0030  cefoTEtan (CEFOTAN) 1 g in sodium chloride 0.9 % 100 mL IVPB        1 g 200 mL/hr over 30 Minutes Intravenous  Once 10/01/23 0015 10/01/23 0134           Data Reviewed:  I have personally reviewed the following...  CBC: Recent Labs  Lab 10/01/23 0522 10/03/23 0402 10/04/23 0406 10/05/23 0319 10/06/23 0949  WBC 13.3* 11.8* 9.9 8.4 9.5  HGB 12.7 10.0* 9.2* 9.4* 11.5*  HCT 40.3 32.3* 29.1* 29.7* 36.2  MCV 80.8 81.4 80.2 81.6 80.6  PLT 279 220 226 259 352   Basic Metabolic Panel: Recent Labs  Lab 09/30/23 1345 10/01/23 0522 10/03/23 0402 10/04/23 0406 10/05/23 0319 10/06/23 0949  NA 140 142 144 143 143 141  K 3.4* 3.4* 3.5 2.9* 3.7 3.4*  CL 106 109 111 112* 109 102  CO2 25 24 21* 25 26 28   GLUCOSE 121* 146* 62* 104* 92 112*  BUN 16 13 21 18 13 12   CREATININE 0.77 0.75 0.74 0.69 0.71 0.80  CALCIUM 8.8* 8.3* 8.0* 7.8* 7.9* 8.5*  MG 2.3  --   --  2.4  --   --    GFR: Estimated Creatinine Clearance: 53.8 mL/min (by C-G formula based on SCr of 0.8 mg/dL). Liver Function Tests: Recent Labs  Lab 09/30/23 1345  AST 20  ALT 12  ALKPHOS 59  BILITOT 0.8  PROT 7.5  ALBUMIN 3.7   Recent Labs  Lab 09/30/23 1345  LIPASE 21   No results for input(s): "AMMONIA" in the last 168 hours. Coagulation Profile: No results for input(s): "INR", "PROTIME" in the last 168 hours. Cardiac Enzymes: No results for input(s): "CKTOTAL", "CKMB", "CKMBINDEX", "TROPONINI" in the last 168 hours. BNP (last 3 results) No results for input(s): "PROBNP" in the last 8760 hours. HbA1C: No  results for input(s): "HGBA1C" in the last 72 hours. CBG: Recent Labs  Lab 10/02/23 0810 10/03/23 1145 10/05/23 0822 10/05/23 2201 10/06/23 0803  GLUCAP 82 68* 107* 95 119*   Lipid Profile: No results for input(s): "CHOL", "HDL", "LDLCALC", "TRIG", "CHOLHDL", "LDLDIRECT" in the last 72 hours. Thyroid Function Tests: No results for input(s): "TSH", "T4TOTAL", "FREET4", "T3FREE", "THYROIDAB" in the last 72 hours. Anemia Panel: No results for input(s): "VITAMINB12", "FOLATE", "FERRITIN", "TIBC", "IRON", "RETICCTPCT" in  the last 72 hours. Most Recent Urinalysis On File:     Component Value Date/Time   COLORURINE YELLOW (A) 09/30/2023 1345   APPEARANCEUR HAZY (A) 09/30/2023 1345   APPEARANCEUR CLEAR 01/09/2015 0938   LABSPEC 1.027 09/30/2023 1345   LABSPEC 1.010 01/09/2015 0938   PHURINE 8.0 09/30/2023 1345   GLUCOSEU NEGATIVE 09/30/2023 1345   GLUCOSEU NEGATIVE 01/09/2015 0938   HGBUR NEGATIVE 09/30/2023 1345   BILIRUBINUR NEGATIVE 09/30/2023 1345   BILIRUBINUR neg 11/03/2022 0927   BILIRUBINUR NEGATIVE 01/09/2015 0938   KETONESUR 5 (A) 09/30/2023 1345   PROTEINUR NEGATIVE 09/30/2023 1345   UROBILINOGEN 0.2 11/03/2022 0927   NITRITE NEGATIVE 09/30/2023 1345   LEUKOCYTESUR NEGATIVE 09/30/2023 1345   LEUKOCYTESUR MODERATE 01/09/2015 0938   Sepsis Labs: @LABRCNTIP (procalcitonin:4,lacticidven:4) Microbiology: No results found for this or any previous visit (from the past 240 hour(s)).    Radiology Studies last 3 days: No results found.        Sunnie Nielsen, DO [facility] 10/06/2023, 12:22 PM    Dictation software may have been used to generate the above note. Typos may occur and escape review in typed/dictated notes. Please contact Dr Lyn Hollingshead directly for clarity if needed.  Staff may message me via secure chat in Epic  but this may not receive an immediate response,  please page me for urgent matters!  If 7PM-7AM, please contact night  coverage [email or web address]

## 2023-10-07 DIAGNOSIS — D72829 Elevated white blood cell count, unspecified: Secondary | ICD-10-CM | POA: Diagnosis not present

## 2023-10-07 DIAGNOSIS — E876 Hypokalemia: Secondary | ICD-10-CM | POA: Diagnosis not present

## 2023-10-07 DIAGNOSIS — K56609 Unspecified intestinal obstruction, unspecified as to partial versus complete obstruction: Secondary | ICD-10-CM | POA: Diagnosis not present

## 2023-10-07 DIAGNOSIS — E162 Hypoglycemia, unspecified: Secondary | ICD-10-CM | POA: Insufficient documentation

## 2023-10-07 DIAGNOSIS — I10 Essential (primary) hypertension: Secondary | ICD-10-CM | POA: Diagnosis not present

## 2023-10-07 LAB — BASIC METABOLIC PANEL
Anion gap: 10 (ref 5–15)
BUN: 18 mg/dL (ref 8–23)
CO2: 27 mmol/L (ref 22–32)
Calcium: 8.1 mg/dL — ABNORMAL LOW (ref 8.9–10.3)
Chloride: 101 mmol/L (ref 98–111)
Creatinine, Ser: 0.89 mg/dL (ref 0.44–1.00)
GFR, Estimated: >60 mL/min (ref >60)
Glucose, Bld: 101 mg/dL — ABNORMAL HIGH (ref 70–99)
Potassium: 3.3 mmol/L — ABNORMAL LOW (ref 3.5–5.1)
Sodium: 138 mmol/L (ref 135–145)

## 2023-10-07 LAB — CBC
HCT: 30.4 % — ABNORMAL LOW (ref 36.0–46.0)
Hemoglobin: 9.8 g/dL — ABNORMAL LOW (ref 12.0–15.0)
MCH: 25.6 pg — ABNORMAL LOW (ref 26.0–34.0)
MCHC: 32.2 g/dL (ref 30.0–36.0)
MCV: 79.4 fL — ABNORMAL LOW (ref 80.0–100.0)
Platelets: 343 10*3/uL (ref 150–400)
RBC: 3.83 MIL/uL — ABNORMAL LOW (ref 3.87–5.11)
RDW: 16.4 % — ABNORMAL HIGH (ref 11.5–15.5)
WBC: 10 10*3/uL (ref 4.0–10.5)
nRBC: 0 % (ref 0.0–0.2)

## 2023-10-07 LAB — GLUCOSE, CAPILLARY
Glucose-Capillary: 99 mg/dL (ref 70–99)
Glucose-Capillary: 83 mg/dL (ref 70–99)

## 2023-10-07 MED ORDER — POTASSIUM CHLORIDE CRYS ER 20 MEQ PO TBCR
40.0000 meq | EXTENDED_RELEASE_TABLET | Freq: Once | ORAL | Status: AC
  Administered 2023-10-07: 40 meq via ORAL
  Filled 2023-10-07: qty 2

## 2023-10-07 NOTE — Plan of Care (Signed)

## 2023-10-07 NOTE — Progress Notes (Addendum)
[facility] SURGICAL PROGRESS NOTE  Hospital Day(s): 7.   Post op day(s): 6 Days Post-Op.   Interval History:  Patient seen and examined No acute events or new complaints overnight.  Patient reports she is feeling better this morning No abdominal pain No fever, chills, nausea, emesis Remains without leukocytosis; WBC 10.0K Hypokalemia to 3.3 She is passing flatus Still awaiting BM Ambulating without issue  Vital signs in last 24 hours: [min-max] current  Temp:  [98 F (36.7 C)-99 F (37.2 C)] 98 F (36.7 C) (10/08 2349) Pulse Rate:  [56-100] 66 (10/08 2349) Resp:  [16-18] 16 (10/08 2349) BP: (107-122)/(59-65) 122/62 (10/08 2349) SpO2:  [96 %-98 %] 98 % (10/08 2349)       Weight: 71.7 kg     Intake/Output last 2 shifts:  No intake/output data recorded.   Physical Exam:  Constitutional: alert, cooperative and no distress  Respiratory: breathing non-labored at rest  Cardiovascular: regular rate and sinus rhythm  Gastrointestinal: soft, non-tender, and non-distended, no rebound/guarding Integumentary: Laparotomy is intact with staples; no erythema or drainage   Labs:     Latest Ref Rng & Units 10/07/2023    6:07 AM 10/06/2023    9:49 AM 10/05/2023    3:19 AM  CBC  WBC 4.0 - 10.5 K/uL 10.0  9.5  8.4   Hemoglobin 12.0 - 15.0 g/dL 9.8  52.8  9.4   Hematocrit 36.0 - 46.0 % 30.4  36.2  29.7   Platelets 150 - 400 K/uL 343  352  259       Latest Ref Rng & Units 10/07/2023    6:07 AM 10/06/2023    9:49 AM 10/05/2023    3:19 AM  CMP  Glucose 70 - 99 mg/dL 413  244  92   BUN 8 - 23 mg/dL 18  12  13    Creatinine 0.44 - 1.00 mg/dL 0.10  2.72  5.36   Sodium 135 - 145 mmol/L 138  141  143   Potassium 3.5 - 5.1 mmol/L 3.3  3.4  3.7   Chloride 98 - 111 mmol/L 101  102  109   CO2 22 - 32 mmol/L 27  28  26    Calcium 8.9 - 10.3 mg/dL 8.1  8.5  7.9      Imaging studies: No new pertinent imaging studies   Assessment/Plan:  82 y.o. female awaiting return of  bowel function  6 Days Post-Op s/p exploratory laparotomy and small bowel resection for closed loop obstruction.   - Will advance to soft diet this AM  - Okay to continue bowel regimen  - Replete K+ - Monitor abdominal examination; on-going bowel function   - Pain control prn; antiemetics prn   - Mobilize  - Further management per primary service; we will follow     - Discharge Planning: Awaiting BM but otherwise doing very well. Anticipate ready for DC in next 24-48 hours  All of the above findings and recommendations were discussed with the patient, and the medical team, and all of patient's and family's questions were answered to their expressed satisfaction.  -- Lynden Oxford, PA-C [facility] 10/07/2023, 7:22 AM M-F: 7am - 4pm

## 2023-10-07 NOTE — Assessment & Plan Note (Signed)
Replaced. °

## 2023-10-07 NOTE — Plan of Care (Signed)

## 2023-10-07 NOTE — Assessment & Plan Note (Signed)
Improved after starting solid food.

## 2023-10-07 NOTE — Assessment & Plan Note (Signed)
Status post exploratory laparotomy, lysis of adhesions, small bowel resection and partial omentectomy.  Advance to solid food.  Still awaiting a bowel movement.

## 2023-10-07 NOTE — Assessment & Plan Note (Signed)
Resolved

## 2023-10-07 NOTE — Assessment & Plan Note (Signed)
Continue Zocor 

## 2023-10-07 NOTE — Assessment & Plan Note (Signed)
Continue Norvasc

## 2023-10-07 NOTE — Progress Notes (Signed)
Progress Note   Patient: Kristy Sellers WUJ:[id number] DOB: [birth year] DOA: 09/30/2023     7 DOS: the patient was seen and examined on 10/07/2023   Brief hospital course: HPI: Kristy Sellers is a 82 y.o. female with medical history significant of hypertension, hyperlipidemia, GERD, detached retina with right eye blindness, C. difficile colitis, mood disorder, who presents with nausea, vomiting, LLQ abdominal pain and constipation x4 days, several episodes of nonbilious nonbloody vomiting   Hospital course / significant events:  10/02: admitted to hospitalist service for SBO w/ general surgical consult. Started NG but developed worsening pain, plan for surgery.  10/03: overnight, underwent exploratory laparotomy, lysis of adhesions, small bowel resection, partial omentectomy - Dr Maurine Minister. NG tube in place.  NPO and NG tube to low intermittent wall suction, pain control, may need PCA.  10/04: OOB a bit more today 10/05: d/c NG and advance to CLD 10/06: flatus, no BM. Advance to FLD and await better bowel function before advancing to a soft diet.  10/07: small BM, pt feeling constipation, hopefully can advance to soft diet later today  10/08: still no significant BM, maintain FLD for now 10/9.  Still no bowel movement.  General surgery advance diet.  Consultants:  General Surgery  Procedures/Surgeries: 10/01/23  Exploratory laparotomy, lysis of adhesions, small bowel resection, partial omentectomy - Dr Maurine Minister        Assessment and Plan: * Small bowel obstruction [facility]) Status post exploratory laparotomy, lysis of adhesions, small bowel resection and partial omentectomy.  Advance to solid food.  Still awaiting a bowel movement.  Leucocytosis Resolved  Hypokalemia Replace orally  HLD (hyperlipidemia) Continue Zocor  Essential hypertension Continue Norvasc  Hypoglycemia Improved after starting solid food.        Subjective: Patient feels okay.  Not having much abdominal  discomfort.  Passing a lot of gas but not having a bowel movement.  Was hoping to eat solid food today.  Admitted with small bowel obstruction.  Physical Exam: Vitals:   10/06/23 1545 10/06/23 2349 10/07/23 0741 10/07/23 1615  BP: 119/65 122/62 (!) 111/55 103/61  Pulse: 100 66 66 73  Resp: 18 16 18 16   Temp: 99 F (37.2 C) 98 F (36.7 C) 98.9 F (37.2 C)   TempSrc:   Oral   SpO2: 96% 98% 94% 97%  Weight:       Physical Exam HENT:     Head: Normocephalic.     Mouth/Throat:     Pharynx: No oropharyngeal exudate.  Eyes:     General: Lids are normal.     Conjunctiva/sclera: Conjunctivae normal.  Cardiovascular:     Rate and Rhythm: Normal rate and regular rhythm.     Heart sounds: Normal heart sounds, S1 normal and S2 normal.  Pulmonary:     Breath sounds: No decreased breath sounds, wheezing, rhonchi or rales.  Abdominal:     General: Bowel sounds are normal.     Palpations: Abdomen is soft.     Tenderness: There is no abdominal tenderness.  Musculoskeletal:     Right lower leg: No swelling.     Left lower leg: No swelling.  Skin:    General: Skin is warm.     Findings: No rash.  Neurological:     Mental Status: She is alert and oriented to person, place, and time.     Data Reviewed: Potassium 3.3, creatinine 0.89, hemoglobin 9.8  Disposition: Status is: Inpatient Remains inpatient appropriate because: Patient tolerating solid food but has not had  a bowel movement yet  Planned Discharge Destination: Home    Time spent: 28 minutes  Author: Alford Highland, MD 10/07/2023 4:40 PM  For on call review [email or web address].

## 2023-10-08 DIAGNOSIS — K56609 Unspecified intestinal obstruction, unspecified as to partial versus complete obstruction: Secondary | ICD-10-CM | POA: Diagnosis not present

## 2023-10-08 DIAGNOSIS — I10 Essential (primary) hypertension: Secondary | ICD-10-CM | POA: Diagnosis not present

## 2023-10-08 DIAGNOSIS — E876 Hypokalemia: Secondary | ICD-10-CM | POA: Diagnosis not present

## 2023-10-08 DIAGNOSIS — D72829 Elevated white blood cell count, unspecified: Secondary | ICD-10-CM | POA: Diagnosis not present

## 2023-10-08 MED ORDER — DOCUSATE SODIUM 100 MG PO CAPS: 100.0000 mg | ORAL_CAPSULE | Freq: Two times a day (BID) | ORAL | 0 refills | Status: DC

## 2023-10-08 MED ORDER — POLYETHYLENE GLYCOL 3350 17 G PO PACK: 17.0000 g | PACK | Freq: Every day | ORAL | 0 refills | Status: DC

## 2023-10-08 MED ORDER — POLYETHYLENE GLYCOL 3350 17 G PO PACK
17.0000 g | PACK | Freq: Every day | ORAL | Status: DC
  Administered 2023-10-08: 17 g via ORAL
  Filled 2023-10-08: qty 1

## 2023-10-08 NOTE — Progress Notes (Signed)
[facility] SURGICAL PROGRESS NOTE  Hospital Day(s): 8.   Post op day(s): 7 Days Post-Op.   Interval History:  Patient seen and examined No acute events or new complaints overnight.  Patient reports she is doing well; no abdominal pain No fever, chills, nausea, emesis No new labs She is passing flatus BM recorded; small  Ambulating without issue  Vital signs in last 24 hours: [min-max] current  Temp:  [98.3 F (36.8 C)-98.9 F (37.2 C)] 98.3 F (36.8 C) (10/09 2354) Pulse Rate:  [66-73] 72 (10/09 2354) Resp:  [16-18] 16 (10/09 2354) BP: (103-122)/(55-71) 122/71 (10/09 2354) SpO2:  [94 %-97 %] 97 % (10/09 2354)       Weight: 71.7 kg     Intake/Output last 2 shifts:  10/09 0701 - 10/10 0700 In: 240 [P.O.:240] Out: 0    Physical Exam:  Constitutional: alert, cooperative and no distress  Respiratory: breathing non-labored at rest  Cardiovascular: regular rate and sinus rhythm  Gastrointestinal: soft, non-tender, and non-distended, no rebound/guarding Integumentary: Laparotomy is intact with staples; no erythema or drainage   Labs:     Latest Ref Rng & Units 10/07/2023    6:07 AM 10/06/2023    9:49 AM 10/05/2023    3:19 AM  CBC  WBC 4.0 - 10.5 K/uL 10.0  9.5  8.4   Hemoglobin 12.0 - 15.0 g/dL 9.8  56.2  9.4   Hematocrit 36.0 - 46.0 % 30.4  36.2  29.7   Platelets 150 - 400 K/uL 343  352  259       Latest Ref Rng & Units 10/07/2023    6:07 AM 10/06/2023    9:49 AM 10/05/2023    3:19 AM  CMP  Glucose 70 - 99 mg/dL 130  865  92   BUN 8 - 23 mg/dL 18  12  13    Creatinine 0.44 - 1.00 mg/dL 7.84  6.96  2.95   Sodium 135 - 145 mmol/L 138  141  143   Potassium 3.5 - 5.1 mmol/L 3.3  3.4  3.7   Chloride 98 - 111 mmol/L 101  102  109   CO2 22 - 32 mmol/L 27  28  26    Calcium 8.9 - 10.3 mg/dL 8.1  8.5  7.9      Imaging studies: No new pertinent imaging studies   Assessment/Plan:  82 y.o. female 7 Days Post-Op s/p exploratory laparotomy and small bowel  resection for closed loop obstruction.   - Okay soft diet   - Okay to continue bowel regimen - Monitor abdominal examination; on-going bowel function   - Pain control prn; antiemetics prn   - Mobilize  - Further management per primary service; we will follow     - Discharge Planning: If she continues to do well this morning and with any continued bowel movements, she is okay for discharge from surgical perspective. I will prep her for discharge including follow up and instructions. Please call with questions.   All of the above findings and recommendations were discussed with the patient, and the medical team, and all of patient's and family's questions were answered to their expressed satisfaction.  -- Lynden Oxford, PA-C [facility] 10/08/2023, 7:16 AM M-F: 7am - 4pm

## 2023-10-08 NOTE — Discharge Instructions (Signed)
In addition to included general post-operative instructions,  Diet: Continue soft diet at home and gradually resume normal diet  Activity: No heavy lifting >20 pounds (children, pets, laundry, garbage) or strenuous activity for 6 weeks from date of surgery, but light activity and walking are encouraged. Do not drive or drink alcohol if taking narcotic pain medications or having pain that might distract from driving.  Wound care: You may shower/get incision wet with soapy water and pat dry (do not rub incisions), but no baths or submerging incision underwater until follow-up. We will remove staples at follow up appointment  Medications: Resume all home medications. For mild to moderate pain: acetaminophen (Tylenol) or ibuprofen/naproxen (if no kidney disease). Combining Tylenol with alcohol can substantially increase your risk of causing liver disease. Narcotic pain medications, if prescribed, can be used for severe pain, though may cause nausea, constipation, and drowsiness. Do not combine Tylenol and Percocet (or similar) within a 6 hour period as Percocet (and similar) contain(s) Tylenol. If you do not need the narcotic pain medication, you do not need to fill the prescription.  Call office [phone] / [phone]) at any time if any questions, worsening pain, fevers/chills, bleeding, drainage from incision site, or other concerns.

## 2023-10-08 NOTE — Care Management Important Message (Signed)
Important Message  Patient Details  Name: Kristy Sellers MRN: [id number] Date of Birth: 1941-02-25   Important Message Given:  Yes - Medicare IM     Verita Schneiders Monique Gift 10/08/2023, 12:14 PM

## 2023-10-08 NOTE — Progress Notes (Signed)
Patient was given verbal and written discharge instructions,she acknowledge understanding and states she will comply. Patient is waiting for ride, daughter will arrive at 1 pm to take home

## 2023-10-08 NOTE — Discharge Summary (Signed)
Physician Discharge Summary   Patient: Kristy Sellers MRN: [id number] DOB: 08/12/1941  Admit date:     09/30/2023  Discharge date: 10/08/23  Discharge Physician: Alford Highland   PCP: Reubin Milan, MD   Recommendations at discharge:   Follow-up PCP 5 days Follow-up general surgery, Dr. Baker Pierini on 10/15/2023 at 9:15 AM.  Discharge Diagnoses: Principal Problem:   Small bowel obstruction (HCC) Active Problems:   Leucocytosis   Hypokalemia   Essential hypertension   HLD (hyperlipidemia)   Hypoglycemia    Hospital Course: HPI: Kristy Sellers is a 82 y.o. female with medical history significant of hypertension, hyperlipidemia, GERD, detached retina with right eye blindness, C. difficile colitis, mood disorder, who presents with nausea, vomiting, LLQ abdominal pain and constipation x4 days, several episodes of nonbilious nonbloody vomiting   Hospital course / significant events:  10/02: admitted to hospitalist service for SBO w/ general surgical consult. Started NG but developed worsening pain, plan for surgery.  10/03: overnight, underwent exploratory laparotomy, lysis of adhesions, small bowel resection, partial omentectomy - Dr Maurine Minister. NG tube in place.  NPO and NG tube to low intermittent wall suction, pain control, may need PCA.  10/04: OOB a bit more today 10/05: d/c NG and advance to CLD 10/06: flatus, no BM. Advance to FLD and await better bowel function before advancing to a soft diet.  10/07: small BM, pt feeling constipation, hopefully can advance to soft diet later today  10/08: still no significant BM, maintain FLD for now 10/9.  Still no bowel movement.  General surgery advanced diet to soft. 10/10.  Patient had a small bowel movement yesterday evening and a large bowel movement this morning after MiraLAX.  Stable for discharge home.  Consultants:  General Surgery  Procedures/Surgeries: 10/01/23  Exploratory laparotomy, lysis of adhesions, small bowel  resection, partial omentectomy - Dr Maurine Minister        Assessment and Plan: * Small bowel obstruction [facility]) Status post exploratory laparotomy, lysis of adhesions, small bowel resection and partial omentectomy on 10/3.  Tolerated advanced diet.  Had a good bowel movement with MiraLAX today.  Patient complained of some itching at the surgical site.  Case discussed with surgical team and patient can take a Benadryl or Claritin if bothersome.  No creams for itching at the surgical site.  Leucocytosis Resolved  Hypokalemia Replaced  HLD (hyperlipidemia) Continue Zocor  Essential hypertension Continue Norvasc  Hypoglycemia Improved after starting solid food.         Consultants: General Surgery Procedures performed: Exploratory laparotomy, lysis of adhesions, small bowel resection and partial omentectomy Disposition: Home Diet recommendation:  Soft diet and slowly advance to regular diet. DISCHARGE MEDICATION: Allergies as of 10/08/2023   No Known Allergies      Medication List     TAKE these medications    amLODipine 5 MG tablet Commonly known as: NORVASC TAKE 1 TABLET (5 MG TOTAL) BY MOUTH DAILY.   aspirin 81 MG tablet Take 81 mg by mouth at bedtime as needed.   atropine 1 % ophthalmic solution PLACE 1 DROP INTO THE RIGHT EYE ONCE DAILY   docusate sodium 100 MG capsule Commonly known as: COLACE Take 1 capsule (100 mg total) by mouth 2 (two) times daily. Hold if you have diarrhea   polyethylene glycol 17 g packet Commonly known as: MIRALAX / GLYCOLAX Take 17 g by mouth daily. Hold if you have diarrhea   prednisoLONE acetate 1 % ophthalmic suspension Commonly known as: PRED FORTE  simvastatin 10 MG tablet Commonly known as: ZOCOR TAKE 1 TABLET BY MOUTH EVERY DAY What changed: when to take this        Follow-up Information     Kandis Cocking, MD. Go on 10/15/2023.   Specialty: General Surgery Why: Go to appointment on 10/17 at 915 AM Contact  information: [address] [place] [address] [phone]         Reubin Milan, MD Follow up on 10/14/2023.   Specialty: Internal Medicine Why: AT [facility] Contact information: [address] [place] [address] [phone]                Discharge Exam: Ceasar Mons Weights   09/30/23 1339  Weight: 71.7 kg   Physical Exam HENT:     Head: Normocephalic.     Mouth/Throat:     Pharynx: No oropharyngeal exudate.  Eyes:     General: Lids are normal.     Conjunctiva/sclera: Conjunctivae normal.  Cardiovascular:     Rate and Rhythm: Normal rate and regular rhythm.     Heart sounds: Normal heart sounds, S1 normal and S2 normal.  Pulmonary:     Breath sounds: No decreased breath sounds, wheezing, rhonchi or rales.  Abdominal:     General: Bowel sounds are normal.     Palpations: Abdomen is soft.     Tenderness: There is no abdominal tenderness.  Musculoskeletal:     Right lower leg: No swelling.     Left lower leg: No swelling.  Skin:    General: Skin is warm.     Findings: No rash.  Neurological:     Mental Status: She is alert and oriented to person, place, and time.      Condition at discharge: stable  The results of significant diagnostics from this hospitalization (including imaging, microbiology, ancillary and laboratory) are listed below for reference.   Imaging Studies: DG Abd Portable 1 View  Result Date: 09/30/2023 CLINICAL DATA:  NG tube placement EXAM: PORTABLE ABDOMEN - 1 VIEW COMPARISON:  Same day CT abdomen and pelvis FINDINGS: Enteric tube tip and side-port in the stomach. Remainder unchanged from same day CT abdomen and pelvis given differences in technique. IMPRESSION: Enteric tube tip and side-port in the stomach. Electronically Signed   By: Minerva Fester M.D.   On: 09/30/2023 19:15   CT ABDOMEN PELVIS W CONTRAST  Result Date: 09/30/2023 CLINICAL DATA:  Abdominal pain, nausea, and vomiting for several days.  Constipation. EXAM: CT ABDOMEN AND PELVIS WITH CONTRAST TECHNIQUE: Multidetector CT imaging of the abdomen and pelvis was performed using the standard protocol following bolus administration of intravenous contrast. RADIATION DOSE REDUCTION: This exam was performed according to the departmental dose-optimization program which includes automated exposure control, adjustment of the mA and/or kV according to patient size and/or use of iterative reconstruction technique. CONTRAST:  OMNIPAQUE IOHEXOL 300 MG/ML  SOLN COMPARISON:  09/06/2015 FINDINGS: Lower Chest: No acute findings. Hepatobiliary: Several benign-appearing hepatic cysts are again noted. No suspicious hepatic masses identified. Gallbladder is unremarkable. No evidence of biliary ductal dilatation. Pancreas:  No mass or inflammatory changes. Spleen: Within normal limits in size and appearance. Adrenals/Urinary Tract: No suspicious masses identified. No evidence of ureteral calculi or hydronephrosis. Stomach/Bowel: Several fluid-filled moderately dilated small bowel loops are seen which are localized to the left lower quadrant, with a swirling or twisting pattern in the central mesentery. This is consistent with a small bowel obstruction, and is suspicious for a closed loop  obstruction possibly due to internal hernia. No evidence of bowel wall thickening or pneumatosis. No evidence of free intraperitoneal air or abscess. Diffuse severe colonic diverticulosis is noted, however, there is no evidence of diverticulitis. Normal appendix visualized. Vascular/Lymphatic: No pathologically enlarged lymph nodes. No acute vascular findings. Reproductive: Prior hysterectomy noted. Adnexal regions are unremarkable in appearance. Other:  None. Musculoskeletal:  No suspicious bone lesions identified. IMPRESSION: Small bowel obstruction in left lower quadrant, with features suspicious for a closed loop obstruction possibly due to internal hernia. Surgical consultation  is recommended. Severe diffuse colonic diverticulosis, without radiographic evidence of diverticulitis. Electronically Signed   By: Danae Orleans M.D.   On: 09/30/2023 16:00    Microbiology: Results for orders placed or performed during the hospital encounter of 09/21/15  Urine culture     Status: None   Collection Time: 09/21/15 10:19 AM   Specimen: Urine, Clean Catch  Result Value Ref Range Status   Specimen Description URINE, CLEAN CATCH  Final   Special Requests NONE  Final   Culture NO GROWTH 2 DAYS  Final   Report Status 09/23/2015 FINAL  Final    Labs: CBC: Recent Labs  Lab 10/03/23 0402 10/04/23 0406 10/05/23 0319 10/06/23 0949 10/07/23 0607  WBC 11.8* 9.9 8.4 9.5 10.0  HGB 10.0* 9.2* 9.4* 11.5* 9.8*  HCT 32.3* 29.1* 29.7* 36.2 30.4*  MCV 81.4 80.2 81.6 80.6 79.4*  PLT 220 226 259 352 343   Basic Metabolic Panel: Recent Labs  Lab 10/03/23 0402 10/04/23 0406 10/05/23 0319 10/06/23 0949 10/07/23 0607  NA 144 143 143 141 138  K 3.5 2.9* 3.7 3.4* 3.3*  CL 111 112* 109 102 101  CO2 21* 25 26 28 27   GLUCOSE 62* 104* 92 112* 101*  BUN 21 18 13 12 18   CREATININE 0.74 0.69 0.71 0.80 0.89  CALCIUM 8.0* 7.8* 7.9* 8.5* 8.1*  MG  --  2.4  --   --   --    Liver Function Tests: No results for input(s): "AST", "ALT", "ALKPHOS", "BILITOT", "PROT", "ALBUMIN" in the last 168 hours. CBG: Recent Labs  Lab 10/05/23 0822 10/05/23 2201 10/06/23 0803 10/07/23 0742 10/07/23 1249  GLUCAP 107* 95 119* 99 83    Discharge time spent: greater than 30 minutes.  Signed: Alford Highland, MD [facility] 10/08/2023

## 2023-10-09 ENCOUNTER — Telehealth: Payer: Self-pay

## 2023-10-09 NOTE — Transitions of Care (Post Inpatient/ED Visit) (Signed)
   10/09/2023  Name: Kristy Sellers MRN: [id number] DOB: 1941-06-18  Today's TOC FU Call Status: Today's TOC FU Call Status:: Successful TOC FU Call Completed TOC FU Call Complete Date: 10/09/23 Patient's Name and Date of Birth confirmed.  Transition Care Management Follow-up Telephone Call Date of Discharge: 10/08/23 Discharge Facility: [facility]) Type of Discharge: Inpatient Admission Primary Inpatient Discharge Diagnosis:: small bowel obstruction How have you been since you were released from the hospital?: Better Any questions or concerns?: No  Items Reviewed: Did you receive and understand the discharge instructions provided?: Yes Medications obtained,verified, and reconciled?: Yes (Medications Reviewed) Any new allergies since your discharge?: No Dietary orders reviewed?: Yes Do you have support at home?: Yes People in Home: child(ren), adult  Medications Reviewed Today: Medications Reviewed Today     Reviewed by Karena Addison, LPN (Licensed Practical Nurse) on 10/09/23 at 1007  Med List Status: <None>   Medication Order Taking? Sig Documenting Provider Last Dose Status Informant  amLODipine (NORVASC) 5 MG tablet [id number] No TAKE 1 TABLET (5 MG TOTAL) BY MOUTH DAILY. Reubin Milan, MD 09/30/2023 Active Self  aspirin 81 MG tablet [id number] No Take 81 mg by mouth at bedtime as needed. [provider] 09/29/2023 Active Self  atropine 1 % ophthalmic solution [id number] No PLACE 1 DROP INTO THE RIGHT EYE ONCE DAILY  Patient not taking: Reported on 09/30/2023   [provider] Not Taking Active Self  docusate sodium (COLACE) 100 MG capsule [id number]  Take 1 capsule (100 mg total) by mouth 2 (two) times daily. Hold if you have diarrhea Alford Highland, MD  Active   polyethylene glycol (MIRALAX / GLYCOLAX) 17 g packet [id number]  Take 17 g by mouth daily. Hold if you have diarrhea Alford Highland, MD  Active   prednisoLONE acetate  (PRED FORTE) 1 % ophthalmic suspension [id number] No   Patient not taking: Reported on 09/30/2023   [provider] Not Taking Active Self  simvastatin (ZOCOR) 10 MG tablet [id number] No TAKE 1 TABLET BY MOUTH EVERY DAY  Patient taking differently: Take 10 mg by mouth at bedtime.   Reubin Milan, MD 09/29/2023 Active Self            Home Care and Equipment/Supplies: Were Home Health Services Ordered?: NA Any new equipment or medical supplies ordered?: NA  Functional Questionnaire: Do you need assistance with bathing/showering or dressing?: No Do you need assistance with meal preparation?: No Do you need assistance with eating?: No Do you have difficulty maintaining continence: No Do you need assistance with getting out of bed/getting out of a chair/moving?: No Do you have difficulty managing or taking your medications?: No  Follow up appointments reviewed: PCP Follow-up appointment confirmed?: Yes Date of PCP follow-up appointment?: 10/14/23 Follow-up Provider: [facility] Follow-up appointment confirmed?: Yes Date of Specialist follow-up appointment?: 10/15/23 Follow-Up Specialty Provider:: GI Do you need transportation to your follow-up appointment?: No Do you understand care options if your condition(s) worsen?: Yes-patient verbalized understanding    SIGNATURE Karena Addison, LPN [facility] Nurse Health Advisor Direct Dial [phone]

## 2023-10-14 ENCOUNTER — Encounter: Payer: Self-pay | Admitting: Internal Medicine

## 2023-10-14 ENCOUNTER — Ambulatory Visit: Payer: Medicare Other | Admitting: Internal Medicine

## 2023-10-14 VITALS — BP 122/74 | HR 76 | Ht 65.0 in | Wt 153.0 lb

## 2023-10-14 DIAGNOSIS — K56609 Unspecified intestinal obstruction, unspecified as to partial versus complete obstruction: Secondary | ICD-10-CM

## 2023-10-14 DIAGNOSIS — I10 Essential (primary) hypertension: Secondary | ICD-10-CM | POA: Diagnosis not present

## 2023-10-14 DIAGNOSIS — Z1231 Encounter for screening mammogram for malignant neoplasm of breast: Secondary | ICD-10-CM

## 2023-10-14 DIAGNOSIS — T8149XA Infection following a procedure, other surgical site, initial encounter: Secondary | ICD-10-CM | POA: Diagnosis not present

## 2023-10-14 MED ORDER — DOXYCYCLINE HYCLATE 100 MG PO TABS
100.0000 mg | ORAL_TABLET | Freq: Two times a day (BID) | ORAL | 0 refills | Status: AC
Start: 2023-10-14 — End: 2023-10-24

## 2023-10-14 NOTE — Assessment & Plan Note (Signed)
Normal exam with stable BP on amlodipine. No concerns or side effects to current medication. No change in regimen; continue low sodium diet.

## 2023-10-14 NOTE — Assessment & Plan Note (Addendum)
Doing well s/p surgery; advancing diet - currently soft foods - with normal bowel movements on Miralax. Still feels foggy headed and in the twilight zone.  She is eager to get home and be able to start walking again. Seeing surgery for follow up tomorrow to remove staples.

## 2023-10-14 NOTE — Patient Instructions (Signed)
Call [facility] to schedule your mammogram at [phone].

## 2023-10-14 NOTE — Progress Notes (Signed)
Date:  10/14/2023   Name:  Kristy Sellers   DOB:  03-09-1941   MRN:  [id number]   Chief Complaint: Hospitalization Follow-up Hospital follow up from [facility].  Admitted 09/30/23 to 10/08/23.  TOC call done on 10/09/23.  HPI  Hospital Course: HPI: Kristy Sellers is a 82 y.o. female with medical history significant of hypertension, hyperlipidemia, GERD, detached retina with right eye blindness, C. difficile colitis, mood disorder, who presents with nausea, vomiting, LLQ abdominal pain and constipation x4 days, several episodes of nonbilious nonbloody vomiting    Hospital course / significant events:  10/02: admitted to hospitalist service for SBO w/ general surgical consult. Started NG but developed worsening pain, plan for surgery.  10/03: overnight, underwent exploratory laparotomy, lysis of adhesions, small bowel resection, partial omentectomy - Dr Maurine Minister. NG tube in place.  NPO and NG tube to low intermittent wall suction, pain control, may need PCA.  10/04: OOB a bit more today 10/05: d/c NG and advance to CLD 10/06: flatus, no BM. Advance to FLD and await better bowel function before advancing to a soft diet.  10/07: small BM, pt feeling constipation, hopefully can advance to soft diet later today  10/08: still no significant BM, maintain FLD for now 10/9.  Still no bowel movement.  General surgery advanced diet to soft. 10/10.  Patient had a small bowel movement yesterday evening and a large bowel movement this morning after MiraLAX.  Stable for discharge home.    Small bowel obstruction (HCC) Status post exploratory laparotomy, lysis of adhesions, small bowel resection and partial omentectomy on 10/3.  Tolerated advanced diet.  Had a good bowel movement with MiraLAX today.  Patient complained of some itching at the surgical site.  Case discussed with surgical team and patient can take a Benadryl or Claritin if bothersome.  No creams for itching at the surgical site.    Leucocytosis Resolved   Hypokalemia Replaced   HLD (hyperlipidemia) Continue Zocor   Essential hypertension Continue Norvasc   Hypoglycemia Improved after starting solid food.   Consultants: General Surgery Procedures performed: Exploratory laparotomy, lysis of adhesions, small bowel resection and partial omentectomy Disposition: Home Diet recommendation:  Soft diet and slowly advance to regular diet. DISCHARGE MEDICATION: Allergies as of 10/08/2023    Review of Systems  Constitutional:  Negative for chills and fever.  Respiratory:  Negative for chest tightness, shortness of breath and wheezing.   Cardiovascular:  Negative for chest pain and leg swelling.  Gastrointestinal:  Negative for abdominal distention, blood in stool and constipation.  Psychiatric/Behavioral:  Negative for dysphoric mood and sleep disturbance. The patient is not nervous/anxious.      Lab Results  Component Value Date   NA 138 10/07/2023   K 3.3 (L) 10/07/2023   CO2 27 10/07/2023   GLUCOSE 101 (H) 10/07/2023   BUN 18 10/07/2023   CREATININE 0.89 10/07/2023   CALCIUM 8.1 (L) 10/07/2023   EGFR 63 11/04/2022   GFRNONAA >60 10/07/2023   Lab Results  Component Value Date   CHOL 156 11/04/2022   HDL 69 11/04/2022   LDLCALC 75 11/04/2022   TRIG 61 11/04/2022   CHOLHDL 2.3 11/04/2022   Lab Results  Component Value Date   TSH 1.560 11/04/2022   No results found for: "HGBA1C" Lab Results  Component Value Date   WBC 10.0 10/07/2023   HGB 9.8 (L) 10/07/2023   HCT 30.4 (L) 10/07/2023   MCV 79.4 (L) 10/07/2023   PLT 343 10/07/2023  Lab Results  Component Value Date   ALT 12 09/30/2023   AST 20 09/30/2023   ALKPHOS 59 09/30/2023   BILITOT 0.8 09/30/2023   No results found for: "25OHVITD2", "25OHVITD3", "VD25OH"   Patient Active Problem List   Diagnosis Date Noted   Hypoglycemia 10/07/2023   Small bowel obstruction (HCC) 09/30/2023   Leucocytosis 09/30/2023   Hypokalemia  09/30/2023   HLD (hyperlipidemia) 09/30/2023   Gastroesophageal reflux disease without esophagitis 05/14/2020   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   Chronic lumbar radiculopathy 10/15/2017   Arthritis of knee 10/15/2017   Primary osteoarthritis of left hip 10/15/2017   Mixed hyperlipidemia 06/11/2016   Blind right eye 06/11/2016   History of Clostridium difficile colitis 06/11/2016   History of positive PPD 06/11/2016   Pseudophakia 05/18/2016   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   Ocular hypertension 05/18/2016   Cataract 05/18/2016   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   Secondary hypotony of eye 09/27/2014   Essential hypertension 09/27/2014    No Known Allergies  Past Surgical History:  Procedure Laterality Date   ABDOMINAL HYSTERECTOMY     BOWEL RESECTION  10/01/2023   Procedure: SMALL BOWEL RESECTION;  Surgeon: Kandis Cocking, MD;  Location: [facility] ORS;  Service: General;;   COLONOSCOPY  ~2006   normal   LAPAROTOMY N/A 10/01/2023   Procedure: EXPLORATORY LAPAROTOMY;  Surgeon: Kandis Cocking, MD;  Location: [facility] ORS;  Service: General;  Laterality: N/A;   LYSIS OF ADHESION  10/01/2023   Procedure: LYSIS OF ADHESION;  Surgeon: Kandis Cocking, MD;  Location: [facility] ORS;  Service: General;;   RETINAL DETACHMENT SURGERY Right     Social History   Tobacco Use   Smoking status: Former    Current packs/day: 0.00    Average packs/day: 0.5 packs/day for 20.0 years (10.0 ttl pk-yrs)    Types: Cigarettes    Start date: 12/30/1963    Quit date: 12/30/1983    Years since quitting: 39.8   Smokeless tobacco: Never  Vaping Use   Vaping status: Never Used  Substance Use Topics   Alcohol use: No    Alcohol/week: 0.0 standard drinks of alcohol   Drug use: Never     Medication list has been reviewed and updated.  Current Meds  Medication Sig   amLODipine (NORVASC) 5 MG tablet TAKE 1 TABLET (5 MG TOTAL) BY MOUTH DAILY.   aspirin 81 MG tablet Take 81 mg by mouth at bedtime as needed.    atropine 1 % ophthalmic solution    docusate sodium (COLACE) 100 MG capsule Take 1 capsule (100 mg total) by mouth 2 (two) times daily. Hold if you have diarrhea   doxycycline (VIBRA-TABS) 100 MG tablet Take 1 tablet (100 mg total) by mouth 2 (two) times daily for 10 days.   polyethylene glycol (MIRALAX / GLYCOLAX) 17 g packet Take 17 g by mouth daily. Hold if you have diarrhea   prednisoLONE acetate (PRED FORTE) 1 % ophthalmic suspension    simvastatin (ZOCOR) 10 MG tablet TAKE 1 TABLET BY MOUTH EVERY DAY (Patient taking differently: Take 10 mg by mouth at bedtime.)       10/14/2023    2:01 PM 05/22/2023    9:06 AM 11/03/2022    8:38 AM 04/30/2022    8:56 AM  GAD 7 : Generalized Anxiety Score  Nervous, Anxious, on Edge 2 0 0 0  Control/stop worrying 2 0 0 0  Worry too much - different things 2 0 2 1  Trouble relaxing  0 0 0 0  Restless 0 0 0 0  Easily annoyed or irritable 0 0 2 0  Afraid - awful might happen 0 0 0 0  Total GAD 7 Score 6 0 4 1  Anxiety Difficulty Somewhat difficult Not difficult at all Somewhat difficult        10/14/2023    2:00 PM 05/22/2023    9:06 AM 11/03/2022    8:38 AM  Depression screen PHQ 2/9  Decreased Interest 3 0 0  Down, Depressed, Hopeless 3 0 1  PHQ - 2 Score 6 0 1  Altered sleeping 3 0 2  Tired, decreased energy 2 0 0  Change in appetite 0 0 0  Feeling bad or failure about yourself  0 0 0  Trouble concentrating 0 0 0  Moving slowly or fidgety/restless 0 0 0  Suicidal thoughts 0 0 0  PHQ-9 Score 11 0 3  Difficult doing work/chores Not difficult at all Not difficult at all Not difficult at all    BP Readings from Last 3 Encounters:  10/14/23 122/74  10/08/23 115/62  05/22/23 128/74    Physical Exam Vitals and nursing note reviewed.  Constitutional:      General: She is not in acute distress.    Appearance: She is well-developed.  HENT:     Head: Normocephalic and atraumatic.  Pulmonary:     Effort: Pulmonary effort is normal. No  respiratory distress.  Skin:    General: Skin is warm and dry.     Findings: No rash.  Neurological:     General: No focal deficit present.     Mental Status: She is alert and oriented to person, place, and time.  Psychiatric:        Mood and Affect: Mood normal.        Behavior: Behavior normal.     Wt Readings from Last 3 Encounters:  10/14/23 153 lb (69.4 kg)  09/30/23 158 lb 1.1 oz (71.7 kg)  05/22/23 158 lb (71.7 kg)    BP 122/74   Pulse 76   Ht 5\' 5"  (1.651 m)   Wt 153 lb (69.4 kg)   SpO2 96%   BMI 25.46 kg/m   Assessment and Plan:  Problem List Items Addressed This Visit       Unprioritized   Essential hypertension (Chronic)    Normal exam with stable BP on amlodipine. No concerns or side effects to current medication. No change in regimen; continue low sodium diet.       Small bowel obstruction (HCC) - Primary    Doing well s/p surgery; advancing diet - currently soft foods - with normal bowel movements on Miralax. Still feels foggy headed and in the twilight zone.  She is eager to get home and be able to start walking again. Seeing surgery for follow up tomorrow to remove staples.      Other Visit Diagnoses     Wound infection after surgery       distal wound with purulent drainage and tissue induration mild tenderness. will start Doxycycline and let surgery evaluate tomorrow   Relevant Medications   doxycycline (VIBRA-TABS) 100 MG tablet   Encounter for screening mammogram for breast cancer       Relevant Orders   MM 3D SCREENING MAMMOGRAM BILATERAL BREAST       No follow-ups on file.    Reubin Milan, MD [facility] [place]

## 2023-10-15 ENCOUNTER — Ambulatory Visit (INDEPENDENT_AMBULATORY_CARE_PROVIDER_SITE_OTHER): Payer: Medicare Other | Admitting: General Surgery

## 2023-10-15 ENCOUNTER — Encounter: Payer: Self-pay | Admitting: General Surgery

## 2023-10-15 VITALS — BP 138/80 | HR 78 | Temp 98.7°F | Ht 65.0 in | Wt 151.4 lb

## 2023-10-15 DIAGNOSIS — K56609 Unspecified intestinal obstruction, unspecified as to partial versus complete obstruction: Secondary | ICD-10-CM

## 2023-10-15 DIAGNOSIS — K565 Intestinal adhesions [bands], unspecified as to partial versus complete obstruction: Secondary | ICD-10-CM

## 2023-10-15 DIAGNOSIS — Z09 Encounter for follow-up examination after completed treatment for conditions other than malignant neoplasm: Secondary | ICD-10-CM

## 2023-10-15 NOTE — Progress Notes (Signed)
Progress note  Subjective: Patient returns status post exploratory laparotomy closed-loop bowel obstruction.  She reports doing well she is tolerating a diet.  She is having bowel movements but does need to use MiraLAX to help her.  She last had a bowel movement last night.  She denies any fevers or chills.  She has noticed some serous drainage from her wound.  She did go to her primary care provider yesterday and was given doxycycline as there was concern for a wound infection.  She had some nausea with the first dose of doxycycline so did not take it this morning.  O: BP 138/80 (BP Location: Right Arm, Patient Position: Sitting, Cuff Size: Small)   Pulse 78   Temp 98.7 F (37.1 C) (Oral)   Ht 5\' 5"  (1.651 m)   Wt 151 lb 6.4 oz (68.7 kg)   SpO2 99%   BMI 25.19 kg/m  Abdomen is soft, nontender.  She is not distended.  Her staples are intact with some of them having scabs over them.  At the lower edge of her incision there is some serous drainage.  There is no purulence.  There is a small amount of reactive erythema but there is not spreading cellulitis or blanching erythema.  A/P: Status post exploratory laparotomy with lysis of adhesions and small bowel resection secondary to a closed-loop obstruction.  She is doing well and tolerating a diet.  I discussed with her that she can liberalize her diet to what ever she wants.  She is worried that she is losing weight so I encouraged her to drink 3 ensures per day.  As far as her wound there is serous drainage from it but there is no evidence of infection.  I told her to stop the diet doxycycline as she does not have a wound infection.  I did tell her that she may have some drainage and as long as it is serous in nature than there it is not a problem.  The staples were removed and I placed Steri-Strips over the incision.  She should continue lifting restrictions and will continue driving restrictions for 2 weeks.  She will follow-up in my office in 2 weeks  for wound check.

## 2023-10-15 NOTE — Patient Instructions (Addendum)
Please STOP the Doxycycline.   If you have any concerns or questions, please feel free to call our office. See follow up appointment.   Exploratory Laparotomy, Adult, Care After After exploratory laparotomy, it's common to have pain, tiredness, bloating, and gas. You may also have no desire to eat food. Follow these instructions at home: Medicines Take over-the-counter and prescription medicines only as told by your health care provider. Take your antibiotics as told by your provider. Do not stop taking your antibiotics even if you start to feel better. Ask your provider if the medicine prescribed to you: Requires you to avoid driving or using machinery. Can cause constipation, or trouble pooping. You may need to take these actions to prevent or treat pooping problems: Drink enough fluid to keep your pee (urine) pale yellow. Take over-the-counter or prescription medicines. Eat foods that are high in fiber, such as beans, whole grains, and fresh fruits and vegetables. Limit foods that are high in fat and processed sugars, such as fried or sweet foods. Incision care  Follow instructions from your provider about how to take care of your incision, or the cut in your belly. Make sure you: Wash your hands with soap and water for at least 20 seconds before and after you change your bandage. If soap and water are not available, use hand sanitizer. Change your bandage as told by your provider. Leave stitches, skin glue, or tape strips in place. These skin closures may need to stay in place for 2 weeks or longer. If tape strip edges start to loosen and curl up, you may trim the loose edges. Do not remove tape strips completely unless your provider tells you to do that. If you were sent home with a drain, follow instructions from your provider about how to care for it. Check the cut in your belly every day for signs of infection. Check for: Redness, swelling, or more pain. Fluid or  blood. Warmth. Pus or a bad smell. Activity  Rest as told by your provider. Do not sit for a long time without moving. Get up to take short walks every 1-2 hours. This will improve blood flow and breathing. Ask for help if you feel weak or unsteady. You may have to avoid lifting. Ask your provider how much you can safely lift. Return to your normal activities as told by your provider. Ask your provider what activities are safe for you. General instructions Do not use any products that contain nicotine or tobacco. These products include cigarettes, chewing tobacco, and vaping devices, such as e-cigarettes. These can delay incision healing after surgery. If you need help quitting, ask your provider. Do not take baths, swim, or use a hot tub until your provider approves. Ask your provider if you may take showers. You may only be allowed to take sponge baths. Keep all follow-up visits. Your provider will check for problems and make sure you are healing. Your provider may give you more instructions. Make sure you know what you can and can't do. Contact a health care provider if: You have a fever or chills. Your pain is getting worse or your pain medicine is not helping. You vomit or feel like you may vomit. You have watery poop. You have trouble pooping or have not had a bowel movement for more than 3 days. You have signs of infection around the cut that was made in your belly. Get help right away if: The cut in your belly opens up. You have warmth, tenderness,  or swelling in your calf. You have trouble breathing. You have chest pain. These symptoms may be an emergency. Get help right away. Call 911. Do not wait to see if the symptoms will go away. Do not drive yourself to the hospital. This information is not intended to replace advice given to you by your health care provider. Make sure you discuss any questions you have with your health care provider. Document Revised: 03/04/2023 Document  Reviewed: 03/04/2023 Elsevier Patient Education  2024 [employer].

## 2023-10-26 ENCOUNTER — Other Ambulatory Visit (INDEPENDENT_AMBULATORY_CARE_PROVIDER_SITE_OTHER): Payer: Medicare Other | Admitting: Internal Medicine

## 2023-10-26 DIAGNOSIS — F39 Unspecified mood [affective] disorder: Secondary | ICD-10-CM

## 2023-10-26 DIAGNOSIS — Z9181 History of falling: Secondary | ICD-10-CM

## 2023-10-26 DIAGNOSIS — Z7982 Long term (current) use of aspirin: Secondary | ICD-10-CM | POA: Insufficient documentation

## 2023-10-26 DIAGNOSIS — E782 Mixed hyperlipidemia: Secondary | ICD-10-CM

## 2023-10-26 DIAGNOSIS — Z48815 Encounter for surgical aftercare following surgery on the digestive system: Secondary | ICD-10-CM | POA: Diagnosis not present

## 2023-10-26 DIAGNOSIS — I10 Essential (primary) hypertension: Secondary | ICD-10-CM

## 2023-10-26 DIAGNOSIS — H5461 Unqualified visual loss, right eye, normal vision left eye: Secondary | ICD-10-CM | POA: Insufficient documentation

## 2023-10-26 DIAGNOSIS — E876 Hypokalemia: Secondary | ICD-10-CM

## 2023-10-26 NOTE — Progress Notes (Signed)
Received home health orders orders from [facility]. Start of care 10/12/23.   Certification and orders from 10/12/23 through 12/10/23 are reviewed, signed and faxed back to home health company.  Need of intermittent skilled services at home: home bound  The home health care plan has been established by me and will be reviewed and updated as needed to maximize patient recovery.  I certify that all home health services have been and will be furnished to the patient while under my care.  Face-to-face encounter in which the need for home health services was established: 10/08/23 at hospital discharge.  Patient is receiving home health services for the following diagnoses: Problem List Items Addressed This Visit       Unprioritized   Encounter for long-term (current) use of aspirin   Encounter for surgical aftercare following surgery of digestive system - Primary   Essential hypertension (Chronic)   HLD (hyperlipidemia)   Hypokalemia   Mild mood disorder (HCC)   Personal history of fall   Vision loss of right eye     Bari Edward, MD

## 2023-10-29 ENCOUNTER — Ambulatory Visit: Payer: Medicare Other | Admitting: General Surgery

## 2023-10-29 ENCOUNTER — Encounter: Payer: Self-pay | Admitting: General Surgery

## 2023-10-29 VITALS — BP 152/75 | HR 79 | Temp 98.0°F | Ht 65.0 in | Wt 149.4 lb

## 2023-10-29 DIAGNOSIS — K562 Volvulus: Secondary | ICD-10-CM

## 2023-10-29 DIAGNOSIS — K56609 Unspecified intestinal obstruction, unspecified as to partial versus complete obstruction: Secondary | ICD-10-CM

## 2023-10-29 DIAGNOSIS — Z09 Encounter for follow-up examination after completed treatment for conditions other than malignant neoplasm: Secondary | ICD-10-CM

## 2023-10-29 NOTE — Progress Notes (Signed)
CC: S/p ex-lap for closed loop bowel obstruction  S: doing well, no further drainage from wound. Walking 30 minutes per day. Tolerating diet and having one soft bowel movement per day.   O: Abdomen is soft, non-tender. Wound is healing well without drainage, no erythema.   A/P: S/p ex-lap for closed loop bowel obstruction, lifting restrictions for 2 more weeks. Okay to drive. Follow up PRN.

## 2023-10-29 NOTE — Patient Instructions (Signed)
If you have any concerns or questions, please feel free to call our office.   Exploratory Laparotomy, Adult, Care After After exploratory laparotomy, it's common to have pain, tiredness, bloating, and gas. You may also have no desire to eat food. Follow these instructions at home: Medicines Take over-the-counter and prescription medicines only as told by your health care provider. Take your antibiotics as told by your provider. Do not stop taking your antibiotics even if you start to feel better. Ask your provider if the medicine prescribed to you: Requires you to avoid driving or using machinery. Can cause constipation, or trouble pooping. You may need to take these actions to prevent or treat pooping problems: Drink enough fluid to keep your pee (urine) pale yellow. Take over-the-counter or prescription medicines. Eat foods that are high in fiber, such as beans, whole grains, and fresh fruits and vegetables. Limit foods that are high in fat and processed sugars, such as fried or sweet foods. Incision care  Follow instructions from your provider about how to take care of your incision, or the cut in your belly. Make sure you: Wash your hands with soap and water for at least 20 seconds before and after you change your bandage. If soap and water are not available, use hand sanitizer. Change your bandage as told by your provider. Leave stitches, skin glue, or tape strips in place. These skin closures may need to stay in place for 2 weeks or longer. If tape strip edges start to loosen and curl up, you may trim the loose edges. Do not remove tape strips completely unless your provider tells you to do that. If you were sent home with a drain, follow instructions from your provider about how to care for it. Check the cut in your belly every day for signs of infection. Check for: Redness, swelling, or more pain. Fluid or blood. Warmth. Pus or a bad smell. Activity  Rest as told by your  provider. Do not sit for a long time without moving. Get up to take short walks every 1-2 hours. This will improve blood flow and breathing. Ask for help if you feel weak or unsteady. You may have to avoid lifting. Ask your provider how much you can safely lift. Return to your normal activities as told by your provider. Ask your provider what activities are safe for you. General instructions Do not use any products that contain nicotine or tobacco. These products include cigarettes, chewing tobacco, and vaping devices, such as e-cigarettes. These can delay incision healing after surgery. If you need help quitting, ask your provider. Do not take baths, swim, or use a hot tub until your provider approves. Ask your provider if you may take showers. You may only be allowed to take sponge baths. Keep all follow-up visits. Your provider will check for problems and make sure you are healing. Your provider may give you more instructions. Make sure you know what you can and can't do. Contact a health care provider if: You have a fever or chills. Your pain is getting worse or your pain medicine is not helping. You vomit or feel like you may vomit. You have watery poop. You have trouble pooping or have not had a bowel movement for more than 3 days. You have signs of infection around the cut that was made in your belly. Get help right away if: The cut in your belly opens up. You have warmth, tenderness, or swelling in your calf. You have trouble breathing. You  have chest pain. These symptoms may be an emergency. Get help right away. Call 911. Do not wait to see if the symptoms will go away. Do not drive yourself to the hospital. This information is not intended to replace advice given to you by your health care provider. Make sure you discuss any questions you have with your health care provider. Document Revised: 03/04/2023 Document Reviewed: 03/04/2023 Elsevier Patient Education  2024 [employer].

## 2023-11-06 ENCOUNTER — Encounter: Payer: Medicare Other | Admitting: Internal Medicine

## 2023-11-10 ENCOUNTER — Ambulatory Visit
Admission: RE | Admit: 2023-11-10 | Discharge: 2023-11-10 | Disposition: A | Discharge status: Home / Self Care | Payer: Medicare Other | Source: Ambulatory Visit | Attending: Internal Medicine | Admitting: Internal Medicine

## 2023-11-10 DIAGNOSIS — Z1231 Encounter for screening mammogram for malignant neoplasm of breast: Secondary | ICD-10-CM | POA: Insufficient documentation

## 2023-11-13 ENCOUNTER — Telehealth: Payer: Self-pay | Admitting: Internal Medicine

## 2023-11-13 NOTE — Telephone Encounter (Signed)
Home Health Verbal Orders - Caller/Agency: [facility] Number: [phone] Service Requested: Occupational Therapy Frequency:   Eval effective week of November 17th  Any new concerns about the patient? No

## 2023-11-13 NOTE — Telephone Encounter (Signed)
Called gave verbal order.   KP

## 2023-11-19 ENCOUNTER — Encounter: Payer: Self-pay | Admitting: Internal Medicine

## 2023-11-19 ENCOUNTER — Ambulatory Visit (INDEPENDENT_AMBULATORY_CARE_PROVIDER_SITE_OTHER): Payer: Medicare Other | Admitting: Internal Medicine

## 2023-11-19 VITALS — BP 128/74 | HR 76 | Ht 65.0 in | Wt 150.0 lb

## 2023-11-19 DIAGNOSIS — E782 Mixed hyperlipidemia: Secondary | ICD-10-CM

## 2023-11-19 DIAGNOSIS — Z1231 Encounter for screening mammogram for malignant neoplasm of breast: Secondary | ICD-10-CM | POA: Diagnosis not present

## 2023-11-19 DIAGNOSIS — Z8719 Personal history of other diseases of the digestive system: Secondary | ICD-10-CM

## 2023-11-19 DIAGNOSIS — I10 Essential (primary) hypertension: Secondary | ICD-10-CM | POA: Diagnosis not present

## 2023-11-19 DIAGNOSIS — Z0000 Encounter for general adult medical examination without abnormal findings: Secondary | ICD-10-CM

## 2023-11-19 NOTE — Progress Notes (Signed)
Date:  11/19/2023   Name:  Kristy Sellers   DOB:  04/24/1941   MRN:  [id number]   Chief Complaint: Annual Exam Kristy Sellers is a 82 y.o. female who presents today for her Complete Annual Exam. She feels well. She reports exercising- walking. She reports she is sleeping poorly. Breast complaints - none.  Mammogram: 10/2023 DEXA: 05/2015 Colonoscopy: aged out  Health Maintenance Due  Topic Date Due   COVID-19 Vaccine (6 - 2023-24 season) 11/09/2023    Immunization History  Administered Date(s) Administered   Fluad Quad(high Dose 65+) 09/21/2019, 10/11/2020, 10/31/2021   Influenza, High Dose Seasonal PF 09/17/2018   Influenza,inj,Quad PF,6+ Mos 10/14/2016, 10/15/2017   Influenza-Unspecified 09/16/2022, 09/21/2023   Moderna Covid-19 Vaccine Bivalent Booster 70yrs & up 09/26/2022   PFIZER(Purple Top)SARS-COV-2 Vaccination 12/31/2019, 01/21/2020, 10/18/2020   PNEUMOCOCCAL CONJUGATE-20 11/03/2022   Pfizer(Comirnaty)Fall Seasonal Vaccine 12 years and older 09/14/2023   Pneumococcal Conjugate-13 10/15/2017   Pneumococcal Polysaccharide-23 01/12/2014   Tdap 07/10/2014   Zoster, Live 09/28/2013     Hypertension This is a chronic problem. The problem is controlled. Pertinent negatives include no chest pain, headaches, palpitations or shortness of breath. Past treatments include calcium channel blockers. The current treatment provides significant improvement. There is no history of kidney disease, CAD/MI or CVA.  Hyperlipidemia This is a chronic problem. The problem is controlled. Pertinent negatives include no chest pain or shortness of breath. Current antihyperlipidemic treatment includes statins.    Review of Systems  Constitutional:  Negative for chills, fatigue and fever.  HENT:  Negative for congestion, hearing loss, tinnitus, trouble swallowing and voice change.   Eyes:  Negative for visual disturbance.  Respiratory:  Negative for cough, chest tightness, shortness of breath and  wheezing.   Cardiovascular:  Negative for chest pain, palpitations and leg swelling.  Gastrointestinal:  Negative for abdominal pain, constipation, diarrhea and vomiting.  Endocrine: Negative for polydipsia and polyuria.  Genitourinary:  Negative for dysuria, frequency, genital sores, vaginal bleeding and vaginal discharge.  Musculoskeletal:  Negative for arthralgias, gait problem and joint swelling.  Skin:  Negative for color change and rash.  Neurological:  Negative for dizziness, tremors, light-headedness and headaches.  Hematological:  Negative for adenopathy. Does not bruise/bleed easily.  Psychiatric/Behavioral:  Negative for dysphoric mood and sleep disturbance. The patient is not nervous/anxious.      Lab Results  Component Value Date   NA 138 10/07/2023   K 3.3 (L) 10/07/2023   CO2 27 10/07/2023   GLUCOSE 101 (H) 10/07/2023   BUN 18 10/07/2023   CREATININE 0.89 10/07/2023   CALCIUM 8.1 (L) 10/07/2023   EGFR 63 11/04/2022   GFRNONAA >60 10/07/2023   Lab Results  Component Value Date   CHOL 156 11/04/2022   HDL 69 11/04/2022   LDLCALC 75 11/04/2022   TRIG 61 11/04/2022   CHOLHDL 2.3 11/04/2022   Lab Results  Component Value Date   TSH 1.560 11/04/2022   No results found for: "HGBA1C" Lab Results  Component Value Date   WBC 10.0 10/07/2023   HGB 9.8 (L) 10/07/2023   HCT 30.4 (L) 10/07/2023   MCV 79.4 (L) 10/07/2023   PLT 343 10/07/2023   Lab Results  Component Value Date   ALT 12 09/30/2023   AST 20 09/30/2023   ALKPHOS 59 09/30/2023   BILITOT 0.8 09/30/2023   No results found for: "25OHVITD2", "25OHVITD3", "VD25OH"   Patient Active Problem List   Diagnosis Date Noted   Encounter for surgical aftercare following  surgery of digestive system 10/26/2023   Encounter for long-term (current) use of aspirin 10/26/2023   Personal history of fall 10/26/2023   Mild mood disorder (HCC) 10/26/2023   Leucocytosis 09/30/2023   Hypokalemia 09/30/2023    Gastroesophageal reflux disease without esophagitis 05/14/2020   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   Chronic lumbar radiculopathy 10/15/2017   Arthritis of knee 10/15/2017   Primary osteoarthritis of left hip 10/15/2017   Mixed hyperlipidemia 06/11/2016   Blind right eye 06/11/2016   History of Clostridium difficile colitis 06/11/2016   History of positive PPD 06/11/2016   Pseudophakia 05/18/2016   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   Ocular hypertension 05/18/2016   Cataract 05/18/2016   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   Secondary hypotony of eye 09/27/2014   Essential hypertension 09/27/2014    No Known Allergies  Past Surgical History:  Procedure Laterality Date   ABDOMINAL HYSTERECTOMY     BOWEL RESECTION  10/01/2023   Procedure: SMALL BOWEL RESECTION;  Surgeon: Kandis Cocking, MD;  Location: [facility] ORS;  Service: General;;   COLONOSCOPY  ~2006   normal   LAPAROTOMY N/A 10/01/2023   Procedure: EXPLORATORY LAPAROTOMY;  Surgeon: Kandis Cocking, MD;  Location: [facility] ORS;  Service: General;  Laterality: N/A;   LYSIS OF ADHESION  10/01/2023   Procedure: LYSIS OF ADHESION;  Surgeon: Kandis Cocking, MD;  Location: [facility] ORS;  Service: General;;   RETINAL DETACHMENT SURGERY Right     Social History   Tobacco Use   Smoking status: Former    Current packs/day: 0.00    Average packs/day: 0.5 packs/day for 20.0 years (10.0 ttl pk-yrs)    Types: Cigarettes    Start date: 12/30/1963    Quit date: 12/30/1983    Years since quitting: 39.9   Smokeless tobacco: Never  Vaping Use   Vaping status: Never Used  Substance Use Topics   Alcohol use: No    Alcohol/week: 0.0 standard drinks of alcohol   Drug use: Never     Medication list has been reviewed and updated.  Current Meds  Medication Sig   amLODipine (NORVASC) 5 MG tablet TAKE 1 TABLET (5 MG TOTAL) BY MOUTH DAILY.   aspirin 81 MG tablet Take 81 mg by mouth at bedtime as needed.   atropine 1 % ophthalmic solution    docusate sodium  (COLACE) 100 MG capsule Take 1 capsule (100 mg total) by mouth 2 (two) times daily. Hold if you have diarrhea   polyethylene glycol (MIRALAX / GLYCOLAX) 17 g packet Take 17 g by mouth daily. Hold if you have diarrhea   prednisoLONE acetate (PRED FORTE) 1 % ophthalmic suspension    simvastatin (ZOCOR) 10 MG tablet TAKE 1 TABLET BY MOUTH EVERY DAY (Patient taking differently: Take 10 mg by mouth at bedtime.)       11/19/2023    8:37 AM 10/14/2023    2:01 PM 05/22/2023    9:06 AM 11/03/2022    8:38 AM  GAD 7 : Generalized Anxiety Score  Nervous, Anxious, on Edge 0 2 0 0  Control/stop worrying 0 2 0 0  Worry too much - different things 0 2 0 2  Trouble relaxing 0 0 0 0  Restless 0 0 0 0  Easily annoyed or irritable 0 0 0 2  Afraid - awful might happen 0 0 0 0  Total GAD 7 Score 0 6 0 4  Anxiety Difficulty Not difficult at all Somewhat difficult Not difficult at all Somewhat difficult  11/19/2023    8:37 AM 10/14/2023    2:00 PM 05/22/2023    9:06 AM  Depression screen PHQ 2/9  Decreased Interest 0 3 0  Down, Depressed, Hopeless 0 3 0  PHQ - 2 Score 0 6 0  Altered sleeping 3 3 0  Tired, decreased energy 2 2 0  Change in appetite 0 0 0  Feeling bad or failure about yourself  0 0 0  Trouble concentrating 0 0 0  Moving slowly or fidgety/restless 0 0 0  Suicidal thoughts 0 0 0  PHQ-9 Score 5 11 0  Difficult doing work/chores Not difficult at all Not difficult at all Not difficult at all    BP Readings from Last 3 Encounters:  11/19/23 128/74  10/29/23 (!) 152/75  10/15/23 138/80    Physical Exam Vitals and nursing note reviewed.  Constitutional:      General: She is not in acute distress.    Appearance: She is well-developed.  HENT:     Head: Normocephalic and atraumatic.     Right Ear: Tympanic membrane and ear canal normal.     Left Ear: Tympanic membrane and ear canal normal.     Nose:     Right Sinus: No maxillary sinus tenderness.     Left Sinus: No  maxillary sinus tenderness.  Eyes:     General: No scleral icterus.       Right eye: No discharge.        Left eye: No discharge.     Conjunctiva/sclera: Conjunctivae normal.  Neck:     Thyroid: No thyromegaly.     Vascular: No carotid bruit.  Cardiovascular:     Rate and Rhythm: Normal rate and regular rhythm.     Pulses: Normal pulses.     Heart sounds: Normal heart sounds.  Pulmonary:     Effort: Pulmonary effort is normal. No respiratory distress.     Breath sounds: No wheezing.  Chest:  Breasts:    Right: No mass, nipple discharge, skin change or tenderness.     Left: No mass, nipple discharge, skin change or tenderness.  Abdominal:     General: Bowel sounds are normal.     Palpations: Abdomen is soft.     Tenderness: There is no abdominal tenderness.       Comments: Well healed surgical scar  Musculoskeletal:     Cervical back: Normal range of motion. No erythema.     Right lower leg: No edema.     Left lower leg: No edema.  Lymphadenopathy:     Cervical: No cervical adenopathy.  Skin:    General: Skin is warm and dry.     Findings: No rash.  Neurological:     Mental Status: She is alert and oriented to person, place, and time.     Cranial Nerves: No cranial nerve deficit.     Sensory: No sensory deficit.     Deep Tendon Reflexes: Reflexes are normal and symmetric.  Psychiatric:        Attention and Perception: Attention normal.        Mood and Affect: Mood normal.     Wt Readings from Last 3 Encounters:  11/19/23 150 lb (68 kg)  10/29/23 149 lb 6.4 oz (67.8 kg)  10/15/23 151 lb 6.4 oz (68.7 kg)    BP 128/74   Pulse 76   Ht 5\' 5"  (1.651 m)   Wt 150 lb (68 kg)   SpO2 99%   BMI 24.96  kg/m   Assessment and Plan:  Problem List Items Addressed This Visit       Unprioritized   Essential hypertension (Chronic)    Controlled BP with normal exam. Current regimen is amlodipine. Will continue same medications; encourage continued reduced sodium  diet.       Relevant Orders   CBC with Differential/Platelet   Comprehensive metabolic panel   TSH   Urinalysis, Routine w reflex microscopic   Mixed hyperlipidemia (Chronic)    LDL is  Lab Results  Component Value Date   LDLCALC 75 11/04/2022    Current regimen is simvastatin.  Tolerating medications well without issues.       Relevant Orders   Lipid panel   Other Visit Diagnoses     Annual physical exam    -  Primary   continue healthy diet, physical activty as able screenings and immunizations are up to date   Encounter for screening mammogram for breast cancer       repeat annually   Hx SBO       healed well and released from surgical follow up bowel moving regularly with qod miralax       Return in about 6 months (around 05/18/2024) for HTN.    Reubin Milan, MD [facility] [place]

## 2023-11-19 NOTE — Assessment & Plan Note (Signed)
 Controlled BP with normal exam. Current regimen is amlodipine. Will continue same medications; encourage continued reduced sodium diet.

## 2023-11-19 NOTE — Assessment & Plan Note (Signed)
LDL is  Lab Results  Component Value Date   LDLCALC 75 11/04/2022    Current regimen is simvastatin.  Tolerating medications well without issues.

## 2023-11-20 LAB — COMPREHENSIVE METABOLIC PANEL
ALT: 8 [IU]/L (ref 0–32)
AST: 18 [IU]/L (ref 0–40)
Albumin: 4.2 g/dL (ref 3.7–4.7)
Alkaline Phosphatase: 69 [IU]/L (ref 44–121)
BUN/Creatinine Ratio: 23 (ref 12–28)
BUN: 17 mg/dL (ref 8–27)
Bilirubin Total: 0.4 mg/dL (ref 0.0–1.2)
CO2: 26 mmol/L (ref 20–29)
Calcium: 9.6 mg/dL (ref 8.7–10.3)
Chloride: 106 mmol/L (ref 96–106)
Creatinine, Ser: 0.73 mg/dL (ref 0.57–1.00)
Globulin, Total: 3 g/dL (ref 1.5–4.5)
Glucose: 72 mg/dL (ref 70–99)
Potassium: 3.8 mmol/L (ref 3.5–5.2)
Sodium: 145 mmol/L — ABNORMAL HIGH (ref 134–144)
Total Protein: 7.2 g/dL (ref 6.0–8.5)
eGFR: 82 mL/min/{1.73_m2} (ref >59)

## 2023-11-20 LAB — CBC WITH DIFFERENTIAL/PLATELET
Basophils Absolute: 0 10*3/uL (ref 0.0–0.2)
Basos: 0 %
EOS (ABSOLUTE): 0.1 10*3/uL (ref 0.0–0.4)
Eos: 1 %
Hematocrit: 37.7 % (ref 34.0–46.6)
Hemoglobin: 11.7 g/dL (ref 11.1–15.9)
Immature Grans (Abs): 0 10*3/uL (ref 0.0–0.1)
Immature Granulocytes: 0 %
Lymphocytes Absolute: 1.6 10*3/uL (ref 0.7–3.1)
Lymphs: 17 %
MCH: 25.8 pg — ABNORMAL LOW (ref 26.6–33.0)
MCHC: 31 g/dL — ABNORMAL LOW (ref 31.5–35.7)
MCV: 83 fL (ref 79–97)
Monocytes Absolute: 0.7 10*3/uL (ref 0.1–0.9)
Monocytes: 7 %
Neutrophils Absolute: 7.3 10*3/uL — ABNORMAL HIGH (ref 1.4–7.0)
Neutrophils: 75 %
Platelets: 302 10*3/uL (ref 150–450)
RBC: 4.53 x10E6/uL (ref 3.77–5.28)
RDW: 16.3 % — ABNORMAL HIGH (ref 11.7–15.4)
WBC: 9.8 10*3/uL (ref 3.4–10.8)

## 2023-11-20 LAB — MICROSCOPIC EXAMINATION
Casts: NONE SEEN /[LPF]
RBC, Urine: NONE SEEN /[HPF] (ref 0–2)

## 2023-11-20 LAB — URINALYSIS, ROUTINE W REFLEX MICROSCOPIC
Bilirubin, UA: NEGATIVE
Glucose, UA: NEGATIVE
Nitrite, UA: NEGATIVE
RBC, UA: NEGATIVE
Specific Gravity, UA: 1.024 (ref 1.005–1.030)
Urobilinogen, Ur: 1 mg/dL (ref 0.2–1.0)
pH, UA: 5.5 (ref 5.0–7.5)

## 2023-11-20 LAB — LIPID PANEL
Chol/HDL Ratio: 2.5 ratio (ref 0.0–4.4)
Cholesterol, Total: 182 mg/dL (ref 100–199)
HDL: 72 mg/dL (ref >39)
LDL Chol Calc (NIH): 92 mg/dL (ref 0–99)
Triglycerides: 101 mg/dL (ref 0–149)
VLDL Cholesterol Cal: 18 mg/dL (ref 5–40)

## 2023-11-20 LAB — TSH: TSH: 2.87 u[IU]/mL (ref 0.450–4.500)

## 2023-12-04 ENCOUNTER — Other Ambulatory Visit: Payer: Self-pay | Admitting: Internal Medicine

## 2023-12-04 ENCOUNTER — Telehealth: Payer: Self-pay | Admitting: Internal Medicine

## 2023-12-04 DIAGNOSIS — Z48815 Encounter for surgical aftercare following surgery on the digestive system: Secondary | ICD-10-CM

## 2023-12-04 DIAGNOSIS — K219 Gastro-esophageal reflux disease without esophagitis: Secondary | ICD-10-CM

## 2023-12-04 DIAGNOSIS — Z7982 Long term (current) use of aspirin: Secondary | ICD-10-CM

## 2023-12-04 DIAGNOSIS — E876 Hypokalemia: Secondary | ICD-10-CM

## 2023-12-04 DIAGNOSIS — Z9181 History of falling: Secondary | ICD-10-CM

## 2023-12-04 DIAGNOSIS — I10 Essential (primary) hypertension: Secondary | ICD-10-CM

## 2023-12-04 DIAGNOSIS — F39 Unspecified mood [affective] disorder: Secondary | ICD-10-CM

## 2023-12-04 DIAGNOSIS — H5461 Unqualified visual loss, right eye, normal vision left eye: Secondary | ICD-10-CM | POA: Insufficient documentation

## 2023-12-04 DIAGNOSIS — E782 Mixed hyperlipidemia: Secondary | ICD-10-CM

## 2023-12-04 NOTE — Telephone Encounter (Signed)
Home Health Verbal Orders - Caller/Agency: sonja from [facility] Number: [phone] Service Requested: plan of care Frequency: ? Any new concerns about the patient? no  Caller says, sent in request for this on Dec 2 and dint get a response

## 2023-12-04 NOTE — Telephone Encounter (Signed)
Will fax plan of care over.

## 2024-01-16 ENCOUNTER — Emergency Department: Payer: Medicare Other

## 2024-01-16 ENCOUNTER — Observation Stay
Admission: EM | Admit: 2024-01-16 | Discharge: 2024-01-17 | Discharge status: Against Medical Advice | Payer: Medicare Other | Attending: Obstetrics and Gynecology | Admitting: Obstetrics and Gynecology

## 2024-01-16 ENCOUNTER — Other Ambulatory Visit: Payer: Self-pay

## 2024-01-16 DIAGNOSIS — Z79899 Other long term (current) drug therapy: Secondary | ICD-10-CM | POA: Insufficient documentation

## 2024-01-16 DIAGNOSIS — W231XXA Caught, crushed, jammed, or pinched between stationary objects, initial encounter: Secondary | ICD-10-CM | POA: Insufficient documentation

## 2024-01-16 DIAGNOSIS — Z7982 Long term (current) use of aspirin: Secondary | ICD-10-CM | POA: Insufficient documentation

## 2024-01-16 DIAGNOSIS — R55 Syncope and collapse: Secondary | ICD-10-CM | POA: Diagnosis present

## 2024-01-16 DIAGNOSIS — H27131 Posterior dislocation of lens, right eye: Secondary | ICD-10-CM | POA: Diagnosis not present

## 2024-01-16 DIAGNOSIS — I10 Essential (primary) hypertension: Secondary | ICD-10-CM | POA: Diagnosis not present

## 2024-01-16 DIAGNOSIS — S0219XA Other fracture of base of skull, initial encounter for closed fracture: Secondary | ICD-10-CM | POA: Diagnosis not present

## 2024-01-16 DIAGNOSIS — S0231XA Fracture of orbital floor, right side, initial encounter for closed fracture: Secondary | ICD-10-CM | POA: Insufficient documentation

## 2024-01-16 DIAGNOSIS — Z87891 Personal history of nicotine dependence: Secondary | ICD-10-CM | POA: Diagnosis not present

## 2024-01-16 HISTORY — DX: Syncope and collapse: R55

## 2024-01-16 LAB — MAGNESIUM: Magnesium: 2.2 mg/dL (ref 1.7–2.4)

## 2024-01-16 LAB — CBC
HCT: 41.6 % (ref 36.0–46.0)
Hemoglobin: 12.3 g/dL (ref 12.0–15.0)
MCH: 24.8 pg — ABNORMAL LOW (ref 26.0–34.0)
MCHC: 29.6 g/dL — ABNORMAL LOW (ref 30.0–36.0)
MCV: 84 fL (ref 80.0–100.0)
Platelets: 272 10*3/uL (ref 150–400)
RBC: 4.95 MIL/uL (ref 3.87–5.11)
RDW: 17.1 % — ABNORMAL HIGH (ref 11.5–15.5)
WBC: 10.5 10*3/uL (ref 4.0–10.5)
nRBC: 0 % (ref 0.0–0.2)

## 2024-01-16 LAB — BASIC METABOLIC PANEL
Anion gap: 8 (ref 5–15)
BUN: 16 mg/dL (ref 8–23)
CO2: 29 mmol/L (ref 22–32)
Calcium: 8.8 mg/dL — ABNORMAL LOW (ref 8.9–10.3)
Chloride: 103 mmol/L (ref 98–111)
Creatinine, Ser: 0.91 mg/dL (ref 0.44–1.00)
GFR, Estimated: >60 mL/min (ref >60)
Glucose, Bld: 106 mg/dL — ABNORMAL HIGH (ref 70–99)
Potassium: 3.5 mmol/L (ref 3.5–5.1)
Sodium: 140 mmol/L (ref 135–145)

## 2024-01-16 LAB — TSH: TSH: 3.288 u[IU]/mL (ref 0.350–4.500)

## 2024-01-16 LAB — TROPONIN I (HIGH SENSITIVITY)
Troponin I (High Sensitivity): 7 ng/L (ref <18)
Troponin I (High Sensitivity): 6 ng/L (ref <18)

## 2024-01-16 LAB — CK: Total CK: 52 U/L (ref 38–234)

## 2024-01-16 MED ORDER — AMLODIPINE BESYLATE 5 MG PO TABS
5.0000 mg | ORAL_TABLET | Freq: Every day | ORAL | Status: DC
Start: 2024-01-17 — End: 2024-01-17

## 2024-01-16 MED ORDER — BACITRACIN ZINC 500 UNIT/GM EX OINT
TOPICAL_OINTMENT | Freq: Once | CUTANEOUS | Status: AC
  Administered 2024-01-16: 1 via TOPICAL
  Filled 2024-01-16: qty 0.9

## 2024-01-16 MED ORDER — PREDNISOLONE ACETATE 1 % OP SUSP
1.0000 [drp] | Freq: Every day | OPHTHALMIC | Status: DC
  Filled 2024-01-16: qty 1

## 2024-01-16 MED ORDER — ONDANSETRON HCL 4 MG PO TABS: 4.0000 mg | ORAL_TABLET | Freq: Four times a day (QID) | ORAL | Status: DC | PRN

## 2024-01-16 MED ORDER — SODIUM CHLORIDE 0.9% FLUSH
3.0000 mL | Freq: Two times a day (BID) | INTRAVENOUS | Status: DC
  Administered 2024-01-16: 3 mL via INTRAVENOUS

## 2024-01-16 MED ORDER — ACETAMINOPHEN 650 MG RE SUPP: 650.0000 mg | Freq: Four times a day (QID) | RECTAL | Status: DC | PRN

## 2024-01-16 MED ORDER — SODIUM CHLORIDE 0.9 % IV BOLUS
1000.0000 mL | Freq: Once | INTRAVENOUS | Status: AC
  Administered 2024-01-16: 1000 mL via INTRAVENOUS

## 2024-01-16 MED ORDER — SIMVASTATIN 20 MG PO TABS
10.0000 mg | ORAL_TABLET | Freq: Every day | ORAL | Status: DC
  Administered 2024-01-16: 10 mg via ORAL
  Filled 2024-01-16: qty 1

## 2024-01-16 MED ORDER — OXYCODONE HCL 5 MG PO TABS: 5.0000 mg | ORAL_TABLET | Freq: Four times a day (QID) | ORAL | Status: DC | PRN

## 2024-01-16 MED ORDER — ACETAMINOPHEN 325 MG PO TABS: 650.0000 mg | ORAL_TABLET | Freq: Four times a day (QID) | ORAL | Status: DC | PRN

## 2024-01-16 MED ORDER — ONDANSETRON HCL 4 MG/2ML IJ SOLN: 4.0000 mg | Freq: Four times a day (QID) | INTRAMUSCULAR | Status: DC | PRN

## 2024-01-16 MED ORDER — POLYETHYLENE GLYCOL 3350 17 G PO PACK: 17.0000 g | PACK | Freq: Every day | ORAL | Status: DC | PRN

## 2024-01-16 MED ORDER — ATROPINE SULFATE 1 % OP SOLN
1.0000 [drp] | Freq: Every day | OPHTHALMIC | Status: DC
  Filled 2024-01-16: qty 2

## 2024-01-16 NOTE — Assessment & Plan Note (Addendum)
CT imaging notable for close right lamina papyracea fracture.  Discussed with ophthalmology; in the absence of any ocular symptoms, outpatient follow-up is appropriate.  - Oxycodone and Tylenol for pain control

## 2024-01-16 NOTE — ED Triage Notes (Signed)
See first nurse note. Pt reports fall for unknown reason. Reports feeling hot and dizzy then waking up on floor. Emesis x1 today.  +hit head. Swelling noted around right eye. Denies blood thinner use.

## 2024-01-16 NOTE — H&P (Signed)
History and Physical    Patient: Kristy Sellers UEA:[id number] DOB: 28-Jul-1941 DOA: 01/16/2024 DOS: the patient was seen and examined on 01/16/2024 PCP: Reubin Milan, MD  Patient coming from: Home  Chief Complaint:  Chief Complaint  Patient presents with   Loss of Consciousness   HPI: Kristy Sellers is a 83 y.o. female with medical history significant of hypertension, hyperlipidemia, SBO s/p ex lap (October 2024), who presents to the ED due to syncope.  Kristy Sellers states she was in her normal state of health today, when she developed nausea and dizziness.  The severity concerned her and so she called her daughter, who called EMS.  She realized that her door was unlocked and so she walked from the kitchen to the front and became diaphoretic and her dizziness worsened.  She states that the next thing she remembers, she was laying on the ground.  She was able to crawl to unlock the front door afterwards.  Prior to the fall or afterwards, she denies any chest pain, palpitations, shortness of breath.  She denies any recent illness including fever, chills.  Only new symptom she has been experiencing lately is dry mouth.  She denies any new medications.  She notes that she has been eating and drinking normally.  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  ED course: On arrival to the ED, patient was normotensive at 129/80 with heart rate of 71.  She was saturating at 100% on room air.  She was afebrile at 98.3.  Initial workup notable for unremarkable CBC, troponin, and BNP.  CT of the head and C-spine with no acute abnormalities.  CT maxillofacial demonstrating medially displaced fracture of the right lamina papyracea, posterior lens dislocation on the right and right scleral buckle.  Chest x-ray with no active disease.  Patient's orthostatic vital signs were negative.  Patient started on IV fluids and [facility] contacted for  admission.  Review of Systems: As mentioned in the history of present illness. All other systems reviewed and are negative.  Past Medical History:  Diagnosis Date   Detached retina 05/18/2016   Overview:   S/p RRD Repair OD with retained S.O.   GERD (gastroesophageal reflux disease)    History of detached retina repair    Hyperlipidemia    Hypertension    Mood disorder (HCC) 03/12/2021   Small bowel obstruction (HCC) 09/30/2023   Past Surgical History:  Procedure Laterality Date   ABDOMINAL HYSTERECTOMY     BOWEL RESECTION  10/01/2023   Procedure: SMALL BOWEL RESECTION;  Surgeon: Kandis Cocking, MD;  Location: [facility] ORS;  Service: General;;   COLONOSCOPY  ~2006   normal   LAPAROTOMY N/A 10/01/2023   Procedure: EXPLORATORY LAPAROTOMY;  Surgeon: Kandis Cocking, MD;  Location: [facility] ORS;  Service: General;  Laterality: N/A;   LYSIS OF ADHESION  10/01/2023   Procedure: LYSIS OF ADHESION;  Surgeon: Kandis Cocking, MD;  Location: [facility] ORS;  Service: General;;   RETINAL DETACHMENT SURGERY Right    Social History:  reports that she quit smoking about 40 years ago. Her smoking use included cigarettes. She started smoking about 60 years ago. She has a 10 pack-year smoking history. She has never used smokeless tobacco. She reports that she does not drink alcohol and does not use drugs.  No Known Allergies  Family History  Problem Relation Age of Onset   Parkinson's disease Mother  CAD Father    CAD Son        89   Breast cancer Neg Hx     Prior to Admission medications   Medication Sig Start Date End Date Taking? Authorizing Provider  amLODipine (NORVASC) 5 MG tablet TAKE 1 TABLET (5 MG TOTAL) BY MOUTH DAILY. 05/06/23   Reubin Milan, MD  aspirin 81 MG tablet Take 81 mg by mouth at bedtime as needed.    [provider]  atropine 1 % ophthalmic solution  04/07/18   [provider]  docusate sodium (COLACE) 100 MG capsule Take 1 capsule (100 mg total) by mouth 2  (two) times daily. Hold if you have diarrhea 10/08/23   Alford Highland, MD  polyethylene glycol (MIRALAX / GLYCOLAX) 17 g packet Take 17 g by mouth daily. Hold if you have diarrhea 10/08/23   Alford Highland, MD  prednisoLONE acetate (PRED FORTE) 1 % ophthalmic suspension  03/24/19   [provider]  simvastatin (ZOCOR) 10 MG tablet TAKE 1 TABLET BY MOUTH EVERY DAY Patient taking differently: Take 10 mg by mouth at bedtime. 09/07/23   Reubin Milan, MD    Physical Exam: Vitals:   01/16/24 1126 01/16/24 1730 01/16/24 1843  BP: 129/80    Pulse: 71 63   Resp: 16 17   Temp: 98.3 F (36.8 C)  98.9 F (37.2 C)  TempSrc:   Oral  SpO2: 97% 100%   Weight: 67.6 kg    Height: 5\' 5"  (1.651 m)     Physical Exam Vitals and nursing note reviewed.  Constitutional:      Appearance: She is normal weight.  HENT:     Head:     Comments: Laceration to the right upper eyelid into the lateral right eye    Mouth/Throat:     Mouth: Mucous membranes are moist.     Pharynx: Oropharynx is clear.  Eyes:     Comments: Left eye pupil equal round and reactive, extraocular motion intact.  Right eye with abnormal pupil, exotropia noted.   Cardiovascular:     Rate and Rhythm: Normal rate and regular rhythm.     Heart sounds: No murmur heard.    No gallop.  Pulmonary:     Effort: Pulmonary effort is normal. No respiratory distress.     Breath sounds: Normal breath sounds. No wheezing, rhonchi or rales.  Abdominal:     General: Bowel sounds are normal. There is no distension.     Palpations: Abdomen is soft.     Tenderness: There is no abdominal tenderness. There is no guarding.  Musculoskeletal:     Right lower leg: No edema.     Left lower leg: No edema.  Skin:    General: Skin is warm and dry.  Neurological:     General: No focal deficit present.     Mental Status: She is alert and oriented to person, place, and time.     Motor: No weakness or tremor.  Psychiatric:        Mood and  Affect: Mood normal.        Behavior: Behavior normal.    Data Reviewed: CBC with WBC of 10.5, hemoglobin of 12.3, MCV of 84 and platelets of 272 BMP with sodium of 140, potassium 3.5, bicarb 29, glucose 106, BUN 16, creatinine 0.91 with GFR above 60 Troponin 7  EKG personally reviewed.  Sinus rhythm with rate of 70.  Findings consistent with LVH.  No QTc prolongation.  No AV block.  Compared to EKG obtained in October 2024, no significant changes noted.  DG Chest Portable 1 View Result Date: 01/16/2024 CLINICAL DATA:  Syncopal episode with nausea resulting in fall. EXAM: PORTABLE CHEST 1 VIEW COMPARISON:  None Available. FINDINGS: Lungs are adequately inflated without focal airspace consolidation or effusion. No pneumothorax. Cardiomediastinal silhouette is normal. No acute fracture. IMPRESSION: No active disease. Electronically Signed   By: Elberta Fortis M.D.   On: 01/16/2024 14:33   CT HEAD WO CONTRAST ( ) Result Date: 01/16/2024 CLINICAL DATA:  Provided history: Head trauma, minor. Facial trauma, blunt. Neck trauma. Additional history provided: Dizziness, nausea, syncopal episode (with fall and head trauma). Right eye hematoma. Emesis. EXAM: CT HEAD WITHOUT CONTRAST CT MAXILLOFACIAL WITHOUT CONTRAST CT CERVICAL SPINE WITHOUT CONTRAST TECHNIQUE: Multidetector CT imaging of the head, cervical spine, and maxillofacial structures were performed using the standard protocol without intravenous contrast. Multiplanar CT image reconstructions of the cervical spine and maxillofacial structures were also generated. RADIATION DOSE REDUCTION: This exam was performed according to the departmental dose-optimization program which includes automated exposure control, adjustment of the mA and/or kV according to patient size and/or use of iterative reconstruction technique. COMPARISON:  None. FINDINGS: CT HEAD FINDINGS Brain: Generalized parenchymal atrophy. Patchy and ill-defined hypoattenuation within the  cerebral white matter, nonspecific but compatible with mild chronic small vessel ischemic disease. Partially empty sella turcica. There is no acute intracranial hemorrhage. No demarcated cortical infarct. No extra-axial fluid collection. No evidence of an intracranial mass. No midline shift. Vascular: No hyperdense vessel.  Atherosclerotic calcifications. Skull: No calvarial fracture or aggressive osseous lesion. CT MAXILLOFACIAL FINDINGS Osseous: Age-indeterminate medially displaced fracture deformity of the right lamina papyracea (for instance as seen on series 8, image 28). No acute maxillofacial fracture identified elsewhere. Orbits: Right scleral buckle. Asymmetric increased density of the right globe, likely postprocedural. Age-indeterminate posterior lens dislocation on the right (series 4, image 48). Sinuses: Small mucous retention cyst within the right maxillary sinus. Soft tissues: Right cheek laceration with underlying foci of subcutaneous gas. CT CERVICAL SPINE FINDINGS Alignment: Nonspecific straightening of the expected cervical lordosis. No significant spondylolisthesis. Skull base and vertebrae: The basion-dental and atlanto-dental intervals are not widened.No evidence of acute fracture to the cervical spine. Soft tissues and spinal canal: No prevertebral fluid or swelling. No visible canal hematoma. Disc levels: Cervical spondylosis with multilevel disc space narrowing, disc bulges/central disc protrusions, posterior disc osteophyte complexes, uncovertebral hypertrophy and facet arthrosis. Disc space narrowing is greatest at C5-C6 and T1-T2 (advanced at these levels). No appreciable high-grade spinal canal stenosis. Multilevel bony neural foraminal narrowing. Degenerative changes also present at the craniocervical junction and radiology this is caudal C1-C2 articulation. Multilevel ventral osteophytes. Upper chest: No consolidation within the imaged lung apices. No visible pneumothorax. Other:  Thyroid nodules measuring up to 10 mm not meeting consensus criteria for ultrasound follow-up based on size. No follow-up imaging recommended. Reference: J Am Coll Radiol. 2015 Feb;12(2): 143-50. IMPRESSION: CT head: 1. No evidence of an acute intracranial abnormality. 2. Parenchymal atrophy and chronic small vessel ischemic disease. CT maxillofacial: 1. Age-indeterminate medially displaced fracture deformity of the right lamina papyracea. 2. Age-indeterminate posterior lens dislocation on the right. 3. Right scleral buckle. Asymmetric increased density of the right globe, presumably post-procedural. 4. Right cheek laceration with underlying foci of subcutaneous gas. 5. Small right maxillary sinus mucous retention cyst. CT cervical spine: 1. No evidence of an acute cervical spine fracture. 2. Nonspecific straightening of the expected cervical lordosis. 3. Cervical  spondylosis as described. Electronically Signed   By: Jackey Loge D.O.   On: 01/16/2024 12:57   CT Cervical Spine Wo Contrast Result Date: 01/16/2024 CLINICAL DATA:  Provided history: Head trauma, minor. Facial trauma, blunt. Neck trauma. Additional history provided: Dizziness, nausea, syncopal episode (with fall and head trauma). Right eye hematoma. Emesis. EXAM: CT HEAD WITHOUT CONTRAST CT MAXILLOFACIAL WITHOUT CONTRAST CT CERVICAL SPINE WITHOUT CONTRAST TECHNIQUE: Multidetector CT imaging of the head, cervical spine, and maxillofacial structures were performed using the standard protocol without intravenous contrast. Multiplanar CT image reconstructions of the cervical spine and maxillofacial structures were also generated. RADIATION DOSE REDUCTION: This exam was performed according to the departmental dose-optimization program which includes automated exposure control, adjustment of the mA and/or kV according to patient size and/or use of iterative reconstruction technique. COMPARISON:  None. FINDINGS: CT HEAD FINDINGS Brain: Generalized  parenchymal atrophy. Patchy and ill-defined hypoattenuation within the cerebral white matter, nonspecific but compatible with mild chronic small vessel ischemic disease. Partially empty sella turcica. There is no acute intracranial hemorrhage. No demarcated cortical infarct. No extra-axial fluid collection. No evidence of an intracranial mass. No midline shift. Vascular: No hyperdense vessel.  Atherosclerotic calcifications. Skull: No calvarial fracture or aggressive osseous lesion. CT MAXILLOFACIAL FINDINGS Osseous: Age-indeterminate medially displaced fracture deformity of the right lamina papyracea (for instance as seen on series 8, image 28). No acute maxillofacial fracture identified elsewhere. Orbits: Right scleral buckle. Asymmetric increased density of the right globe, likely postprocedural. Age-indeterminate posterior lens dislocation on the right (series 4, image 48). Sinuses: Small mucous retention cyst within the right maxillary sinus. Soft tissues: Right cheek laceration with underlying foci of subcutaneous gas. CT CERVICAL SPINE FINDINGS Alignment: Nonspecific straightening of the expected cervical lordosis. No significant spondylolisthesis. Skull base and vertebrae: The basion-dental and atlanto-dental intervals are not widened.No evidence of acute fracture to the cervical spine. Soft tissues and spinal canal: No prevertebral fluid or swelling. No visible canal hematoma. Disc levels: Cervical spondylosis with multilevel disc space narrowing, disc bulges/central disc protrusions, posterior disc osteophyte complexes, uncovertebral hypertrophy and facet arthrosis. Disc space narrowing is greatest at C5-C6 and T1-T2 (advanced at these levels). No appreciable high-grade spinal canal stenosis. Multilevel bony neural foraminal narrowing. Degenerative changes also present at the craniocervical junction and radiology this is caudal C1-C2 articulation. Multilevel ventral osteophytes. Upper chest: No  consolidation within the imaged lung apices. No visible pneumothorax. Other: Thyroid nodules measuring up to 10 mm not meeting consensus criteria for ultrasound follow-up based on size. No follow-up imaging recommended. Reference: J Am Coll Radiol. 2015 Feb;12(2): 143-50. IMPRESSION: CT head: 1. No evidence of an acute intracranial abnormality. 2. Parenchymal atrophy and chronic small vessel ischemic disease. CT maxillofacial: 1. Age-indeterminate medially displaced fracture deformity of the right lamina papyracea. 2. Age-indeterminate posterior lens dislocation on the right. 3. Right scleral buckle. Asymmetric increased density of the right globe, presumably post-procedural. 4. Right cheek laceration with underlying foci of subcutaneous gas. 5. Small right maxillary sinus mucous retention cyst. CT cervical spine: 1. No evidence of an acute cervical spine fracture. 2. Nonspecific straightening of the expected cervical lordosis. 3. Cervical spondylosis as described. Electronically Signed   By: Jackey Loge D.O.   On: 01/16/2024 12:57   CT MAXILLOFACIAL WO CONTRAST Result Date: 01/16/2024 CLINICAL DATA:  Provided history: Head trauma, minor. Facial trauma, blunt. Neck trauma. Additional history provided: Dizziness, nausea, syncopal episode (with fall and head trauma). Right eye hematoma. Emesis. EXAM: CT HEAD WITHOUT CONTRAST CT MAXILLOFACIAL WITHOUT CONTRAST  CT CERVICAL SPINE WITHOUT CONTRAST TECHNIQUE: Multidetector CT imaging of the head, cervical spine, and maxillofacial structures were performed using the standard protocol without intravenous contrast. Multiplanar CT image reconstructions of the cervical spine and maxillofacial structures were also generated. RADIATION DOSE REDUCTION: This exam was performed according to the departmental dose-optimization program which includes automated exposure control, adjustment of the mA and/or kV according to patient size and/or use of iterative reconstruction technique.  COMPARISON:  None. FINDINGS: CT HEAD FINDINGS Brain: Generalized parenchymal atrophy. Patchy and ill-defined hypoattenuation within the cerebral white matter, nonspecific but compatible with mild chronic small vessel ischemic disease. Partially empty sella turcica. There is no acute intracranial hemorrhage. No demarcated cortical infarct. No extra-axial fluid collection. No evidence of an intracranial mass. No midline shift. Vascular: No hyperdense vessel.  Atherosclerotic calcifications. Skull: No calvarial fracture or aggressive osseous lesion. CT MAXILLOFACIAL FINDINGS Osseous: Age-indeterminate medially displaced fracture deformity of the right lamina papyracea (for instance as seen on series 8, image 28). No acute maxillofacial fracture identified elsewhere. Orbits: Right scleral buckle. Asymmetric increased density of the right globe, likely postprocedural. Age-indeterminate posterior lens dislocation on the right (series 4, image 48). Sinuses: Small mucous retention cyst within the right maxillary sinus. Soft tissues: Right cheek laceration with underlying foci of subcutaneous gas. CT CERVICAL SPINE FINDINGS Alignment: Nonspecific straightening of the expected cervical lordosis. No significant spondylolisthesis. Skull base and vertebrae: The basion-dental and atlanto-dental intervals are not widened.No evidence of acute fracture to the cervical spine. Soft tissues and spinal canal: No prevertebral fluid or swelling. No visible canal hematoma. Disc levels: Cervical spondylosis with multilevel disc space narrowing, disc bulges/central disc protrusions, posterior disc osteophyte complexes, uncovertebral hypertrophy and facet arthrosis. Disc space narrowing is greatest at C5-C6 and T1-T2 (advanced at these levels). No appreciable high-grade spinal canal stenosis. Multilevel bony neural foraminal narrowing. Degenerative changes also present at the craniocervical junction and radiology this is caudal C1-C2  articulation. Multilevel ventral osteophytes. Upper chest: No consolidation within the imaged lung apices. No visible pneumothorax. Other: Thyroid nodules measuring up to 10 mm not meeting consensus criteria for ultrasound follow-up based on size. No follow-up imaging recommended. Reference: J Am Coll Radiol. 2015 Feb;12(2): 143-50. IMPRESSION: CT head: 1. No evidence of an acute intracranial abnormality. 2. Parenchymal atrophy and chronic small vessel ischemic disease. CT maxillofacial: 1. Age-indeterminate medially displaced fracture deformity of the right lamina papyracea. 2. Age-indeterminate posterior lens dislocation on the right. 3. Right scleral buckle. Asymmetric increased density of the right globe, presumably post-procedural. 4. Right cheek laceration with underlying foci of subcutaneous gas. 5. Small right maxillary sinus mucous retention cyst. CT cervical spine: 1. No evidence of an acute cervical spine fracture. 2. Nonspecific straightening of the expected cervical lordosis. 3. Cervical spondylosis as described. Electronically Signed   By: Jackey Loge D.O.   On: 01/16/2024 12:57   There are no new results to review at this time.  Assessment and Plan:  * Syncope Patient presenting with dizziness and nausea, with an episode of syncope leading to right facial injuries.  Unclear etiology as orthostatic vital signs are negative.  Potentially vasovagal given prodrome. CBC and BMP are reassuring, as are magnesium, CK and TSH.   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  - Outpatient follow-up with  ophthalmology  Closed lamina papyracea fracture (HCC) CT imaging notable for close right lamina papyracea fracture.  Discussed with ophthalmology; in the  absence of any ocular symptoms, outpatient follow-up is appropriate.  - Oxycodone and Tylenol for pain control  Essential hypertension History of hypertension managed with amlodipine.  - Continue home regimen  Advance Care Planning:   Code Status: Full Code   Consults: None  Family Communication: Patient's daughter and son-in-law updated at bedside  Severity of Illness: The appropriate patient status for this patient is OBSERVATION. Observation status is judged to be reasonable and necessary in order to provide the required intensity of service to ensure the patient's safety. The patient's presenting symptoms, physical exam findings, and initial radiographic and laboratory data in the context of their medical condition is felt to place them at decreased risk for further clinical deterioration. Furthermore, it is anticipated that the patient will be medically stable for discharge from the hospital within 2 midnights of admission.   Author: Verdene Lennert, MD 01/16/2024 6:56 PM  For on call review [email or web address].

## 2024-01-16 NOTE — Assessment & Plan Note (Signed)
History of hypertension managed with amlodipine.  - Continue home regimen

## 2024-01-16 NOTE — Assessment & Plan Note (Addendum)
Patient presenting with dizziness and nausea, with an episode of syncope leading to right facial injuries.  Unclear etiology as orthostatic vital signs are negative.  Potentially vasovagal given prodrome. CBC and BMP are reassuring, as are magnesium, CK and TSH.   - Telemetry monitoring - Echocardiogram ordered - S/p 1 L bolus - Repeat troponin pending

## 2024-01-16 NOTE — Assessment & Plan Note (Signed)
Discussed with ophthalmology; given patient's history of previous right eye surgery, in the absence of acute symptoms, no need for inpatient evaluation.  - Outpatient follow-up with ophthalmology

## 2024-01-16 NOTE — ED Triage Notes (Signed)
First nurse note: Patient arrived by [facility] from home. Reports feeling dizzy with nausea and then syncopal episode. Fell and hit head. Hematoma noted to right eye. Reports emesis X1  A&O x4 per EMS  EMS vitals: 149CBG 64HR 98% RA 126/76 b/p

## 2024-01-16 NOTE — ED Provider Notes (Signed)
[facility] Provider Note    Event Date/Time   First MD Initiated Contact with Patient 01/16/24 1334     (approximate)   History   Loss of Consciousness   HPI  Morelia Cluck is a 83 y.o. female presents to the ER for evaluation of LOC and head injury that occurred this morning.  Patient was at home started feeling some nausea.  Started feeling lightheaded called ambulance and realized that her door was still locked so walked to go unlock the door and after doing that fell to the ground feels like she passed out uncertain as to how long she was out but also not completely certain that she was completely with loss of consciousness.  Did hit the right side of her head.  She denies any blood thinners.  States that she otherwise feels fine right now denies any palpitations no chest pain or shortness of breath no melena no diarrhea.     Physical Exam   Triage Vital Signs: ED Triage Vitals [01/16/24 1126]  Encounter Vitals Group     BP 129/80     Systolic BP Percentile      Diastolic BP Percentile      Pulse Rate 71     Resp 16     Temp 98.3 F (36.8 C)     Temp src      SpO2 97 %     Weight 149 lb (67.6 kg)     Height 5\' 5"  (1.651 m)     Head Circumference      Peak Flow      Pain Score 0     Pain Loc      Pain Education      Exclude from Growth Chart     Most recent vital signs: Vitals:   01/16/24 1126  BP: 129/80  Pulse: 71  Resp: 16  Temp: 98.3 F (36.8 C)  SpO2: 97%     Constitutional: Alert  Eyes: Conjunctivae are normal.  Head: Contusion and ecchymosis of the right lateral eyebrow and cheek.  Small lacerations are well-approximated and hemostatic. Nose: No congestion/rhinnorhea. Mouth/Throat: Mucous membranes are moist.   Neck: Painless ROM.  Cardiovascular:   Good peripheral circulation. Respiratory: Normal respiratory effort.  No retractions.  Gastrointestinal: Soft and nontender.  Musculoskeletal:  no deformity Neurologic:   MAE spontaneously. No gross focal neurologic deficits are appreciated.  Skin:  Skin is warm, dry and intact. No rash noted. Psychiatric: Mood and affect are normal. Speech and behavior are normal.    ED Results / Procedures / Treatments   Labs (all labs ordered are listed, but only abnormal results are displayed) Labs Reviewed  CBC - Abnormal; Notable for the following components:      Result Value   MCH 24.8 (*)    MCHC 29.6 (*)    RDW 17.1 (*)    All other components within normal limits  BASIC METABOLIC PANEL - Abnormal; Notable for the following components:   Glucose, Bld 106 (*)    Calcium 8.8 (*)    All other components within normal limits  TROPONIN I (HIGH SENSITIVITY)  TROPONIN I (HIGH SENSITIVITY)     EKG  ED ECG REPORT I, Willy Eddy, the attending physician, personally viewed and interpreted this ECG.   Date: 01/16/2024  EKG Time: 11:31  Rate: 70  Rhythm: sinus  Axis: left  Intervals: normal  ST&T Change: nonspecifc st abn    RADIOLOGY Please see ED Course for  my review and interpretation.  I personally reviewed all radiographic images ordered to evaluate for the above acute complaints and reviewed radiology reports and findings.  These findings were personally discussed with the patient.  Please see medical record for radiology report.    PROCEDURES:  Critical Care performed: No  Procedures   MEDICATIONS ORDERED IN ED: Medications  bacitracin ointment (1 Application Topical Given 01/16/24 1444)  sodium chloride 0.9 % bolus 1,000 mL (1,000 mLs Intravenous New Bag/Given 01/16/24 1514)     IMPRESSION / MDM / ASSESSMENT AND PLAN / ED COURSE  I reviewed the triage vital signs and the nursing notes.                              Differential diagnosis includes, but is not limited to, dysrhythmia, dehydration, orthostasis, anemia, ACS, vasovagal, SDH, IPH  Patient presenting to the ER for evaluation of symptoms as described above.  Based on  symptoms, risk factors and considered above differential, this presenting complaint could reflect a potentially life-threatening illness therefore the patient will be placed on continuous pulse oximetry and telemetry for monitoring.  Laboratory evaluation will be sent to evaluate for the above complaints.  Patient clinically nontoxic-appearing CT head on my review and interpretation does not show any evidence of SDH or IPH.  EKG with some nonspecific changes.  Troponin negative.  Given patient's age and risk factors discussed option for hospitalization for further observation.  Have ordered IV fluids.  Will have consulted with hospitalist who agrees to evaluate patient for admission.        FINAL CLINICAL IMPRESSION(S) / ED DIAGNOSES   Final diagnoses:  Syncope and collapse     Rx / DC Orders   ED Discharge Orders     None        Note:  This document was prepared using Dragon voice recognition software and may include unintentional dictation errors.    Willy Eddy, MD 01/16/24 [id number]

## 2024-01-17 ENCOUNTER — Ambulatory Visit: Admission: RE | Admit: 2024-01-17 | Source: Ambulatory Visit

## 2024-01-17 DIAGNOSIS — R55 Syncope and collapse: Secondary | ICD-10-CM | POA: Diagnosis not present

## 2024-01-17 NOTE — Progress Notes (Signed)
Pt decided to leave AMA after speaking with nursing supervisor, this nurse and on call provider Manuela Schwartz, NP.  Reason for admission and risks associated with leaving AMA explained to pt by Manuela Schwartz, NP and again with dtr present per this nurse. Pt does not want to be admitted to a semi-private room.  Refused to sign AMA form.

## 2024-01-17 NOTE — Progress Notes (Signed)
No complaints of pain or discomfort.

## 2024-01-19 ENCOUNTER — Telehealth: Payer: Self-pay

## 2024-01-19 NOTE — Telephone Encounter (Signed)
Tried calling patient back and phone continued to ring but no answer. If patient calls back, she either needs to go back to the ED ( she left without being seen) or she can schedule to see Dr Judithann Graves when she returns in 2 weeks from vacation. She should not be scheduled with another provider for this because this is not an acute problem.

## 2024-01-19 NOTE — Telephone Encounter (Signed)
Patient scheduled a hospital follow up with PCP for 02/03/2024

## 2024-01-19 NOTE — Telephone Encounter (Signed)
Copied from CRM [id number]. Topic: General - Other >> Jan 19, 2024  8:51 AM Franchot Heidelberg wrote: Reason for CRM: Pt wants to speak to her PCP's nurse specifically, she needs to schedule a echocardiogram following her ED visit last weekend. Please advise   Best contact: [phone]

## 2024-01-26 NOTE — Discharge Summary (Signed)
Physician Discharge Summary   Patient: Kristy Sellers MRN: [id number] DOB: 1941/06/06  Admit date:     01/16/2024  Discharge date: 01/17/2024  Discharge Physician: Verdene Lennert   PCP: Reubin Milan, MD   Recommendations at discharge:    Patient will need echocardiogram  Discharge Diagnoses: Principal Problem:   Syncope Active Problems:   Essential hypertension   Closed lamina papyracea fracture (HCC)   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  Resolved Problems:   * No resolved hospital problems. [facility] Course: On arrival to the ED, patient was normotensive at 129/80 with heart rate of 71. She was saturating at 100% on room air. She was afebrile at 98.3. Initial workup notable for unremarkable CBC, troponin, and BNP. CT of the head and C-spine with no acute abnormalities. CT maxillofacial demonstrating medially displaced fracture of the right lamina papyracea, posterior lens dislocation on the right and right scleral buckle. Chest x-ray with no active disease. Patient's orthostatic vital signs were negative. Patient started on IV fluids and TRH contacted for admission.   Work up initiated as noted below. Patient decided to leave AGAINST MEDICAL ADVICE.   Assessment and Plan:  * Syncope Patient presenting with dizziness and nausea, with an episode of syncope leading to right facial injuries.  Unclear etiology as orthostatic vital signs are negative.  Potentially vasovagal given prodrome. CBC and BMP are reassuring, as are magnesium, CK and TSH.   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  - Outpatient follow-up with ophthalmology  Closed lamina papyracea fracture (HCC) CT imaging notable for close right lamina papyracea  fracture.  Discussed with ophthalmology; in the absence of any ocular symptoms, outpatient follow-up is appropriate.  - Oxycodone and Tylenol for pain control  Essential hypertension History of hypertension managed with amlodipine.  - Continue home regimen   Consultants: None Procedures performed: None  Disposition: Home Diet recommendation:  Regular diet DISCHARGE MEDICATION: Allergies as of 01/17/2024   No Known Allergies      Medication List     ASK your doctor about these medications    amLODipine 5 MG tablet Commonly known as: NORVASC TAKE 1 TABLET (5 MG TOTAL) BY MOUTH DAILY.   aspirin 81 MG tablet Take 81 mg by mouth at bedtime as needed.   atropine 1 % ophthalmic solution   docusate sodium 100 MG capsule Commonly known as: COLACE Take 1 capsule (100 mg total) by mouth 2 (two) times daily. Hold if you have diarrhea   polyethylene glycol 17 g packet Commonly known as: MIRALAX / GLYCOLAX Take 17 g by mouth daily. Hold if you have diarrhea   prednisoLONE acetate 1 % ophthalmic suspension Commonly known as: PRED FORTE   simvastatin 10 MG tablet Commonly known as: ZOCOR TAKE 1 TABLET BY MOUTH EVERY DAY        Discharge Exam: Filed Weights   01/16/24 1126  Weight: 67.6 kg   Condition at discharge: stable  The results of significant diagnostics from this hospitalization (including imaging, microbiology, ancillary and laboratory) are listed below for reference.   Imaging Studies: DG Chest Portable 1 View Result Date: 01/16/2024 CLINICAL DATA:  Syncopal episode with nausea resulting in fall. EXAM: PORTABLE CHEST 1 VIEW COMPARISON:  None Available. FINDINGS: Lungs are adequately inflated  without focal airspace consolidation or effusion. No pneumothorax. Cardiomediastinal silhouette is normal. No acute fracture. IMPRESSION: No active disease. Electronically Signed   By: Elberta Fortis M.D.   On: 01/16/2024 14:33   CT HEAD WO CONTRAST ( ) Result Date:  01/16/2024 CLINICAL DATA:  Provided history: Head trauma, minor. Facial trauma, blunt. Neck trauma. Additional history provided: Dizziness, nausea, syncopal episode (with fall and head trauma). Right eye hematoma. Emesis. EXAM: CT HEAD WITHOUT CONTRAST CT MAXILLOFACIAL WITHOUT CONTRAST CT CERVICAL SPINE WITHOUT CONTRAST TECHNIQUE: Multidetector CT imaging of the head, cervical spine, and maxillofacial structures were performed using the standard protocol without intravenous contrast. Multiplanar CT image reconstructions of the cervical spine and maxillofacial structures were also generated. RADIATION DOSE REDUCTION: This exam was performed according to the departmental dose-optimization program which includes automated exposure control, adjustment of the mA and/or kV according to patient size and/or use of iterative reconstruction technique. COMPARISON:  None. FINDINGS: CT HEAD FINDINGS Brain: Generalized parenchymal atrophy. Patchy and ill-defined hypoattenuation within the cerebral white matter, nonspecific but compatible with mild chronic small vessel ischemic disease. Partially empty sella turcica. There is no acute intracranial hemorrhage. No demarcated cortical infarct. No extra-axial fluid collection. No evidence of an intracranial mass. No midline shift. Vascular: No hyperdense vessel.  Atherosclerotic calcifications. Skull: No calvarial fracture or aggressive osseous lesion. CT MAXILLOFACIAL FINDINGS Osseous: Age-indeterminate medially displaced fracture deformity of the right lamina papyracea (for instance as seen on series 8, image 28). No acute maxillofacial fracture identified elsewhere. Orbits: Right scleral buckle. Asymmetric increased density of the right globe, likely postprocedural. Age-indeterminate posterior lens dislocation on the right (series 4, image 48). Sinuses: Small mucous retention cyst within the right maxillary sinus. Soft tissues: Right cheek laceration with underlying foci of  subcutaneous gas. CT CERVICAL SPINE FINDINGS Alignment: Nonspecific straightening of the expected cervical lordosis. No significant spondylolisthesis. Skull base and vertebrae: The basion-dental and atlanto-dental intervals are not widened.No evidence of acute fracture to the cervical spine. Soft tissues and spinal canal: No prevertebral fluid or swelling. No visible canal hematoma. Disc levels: Cervical spondylosis with multilevel disc space narrowing, disc bulges/central disc protrusions, posterior disc osteophyte complexes, uncovertebral hypertrophy and facet arthrosis. Disc space narrowing is greatest at C5-C6 and T1-T2 (advanced at these levels). No appreciable high-grade spinal canal stenosis. Multilevel bony neural foraminal narrowing. Degenerative changes also present at the craniocervical junction and radiology this is caudal C1-C2 articulation. Multilevel ventral osteophytes. Upper chest: No consolidation within the imaged lung apices. No visible pneumothorax. Other: Thyroid nodules measuring up to 10 mm not meeting consensus criteria for ultrasound follow-up based on size. No follow-up imaging recommended. Reference: J Am Coll Radiol. 2015 Feb;12(2): 143-50. IMPRESSION: CT head: 1. No evidence of an acute intracranial abnormality. 2. Parenchymal atrophy and chronic small vessel ischemic disease. CT maxillofacial: 1. Age-indeterminate medially displaced fracture deformity of the right lamina papyracea. 2. Age-indeterminate posterior lens dislocation on the right. 3. Right scleral buckle. Asymmetric increased density of the right globe, presumably post-procedural. 4. Right cheek laceration with underlying foci of subcutaneous gas. 5. Small right maxillary sinus mucous retention cyst. CT cervical spine: 1. No evidence of an acute cervical spine fracture. 2. Nonspecific straightening of the expected cervical lordosis. 3. Cervical spondylosis as described. Electronically Signed   By: Jackey Loge D.O.   On:  01/16/2024 12:57   CT Cervical Spine Wo Contrast Result Date: 01/16/2024 CLINICAL DATA:  Provided history: Head trauma, minor. Facial trauma, blunt. Neck trauma. Additional history provided: Dizziness, nausea, syncopal episode (  with fall and head trauma). Right eye hematoma. Emesis. EXAM: CT HEAD WITHOUT CONTRAST CT MAXILLOFACIAL WITHOUT CONTRAST CT CERVICAL SPINE WITHOUT CONTRAST TECHNIQUE: Multidetector CT imaging of the head, cervical spine, and maxillofacial structures were performed using the standard protocol without intravenous contrast. Multiplanar CT image reconstructions of the cervical spine and maxillofacial structures were also generated. RADIATION DOSE REDUCTION: This exam was performed according to the departmental dose-optimization program which includes automated exposure control, adjustment of the mA and/or kV according to patient size and/or use of iterative reconstruction technique. COMPARISON:  None. FINDINGS: CT HEAD FINDINGS Brain: Generalized parenchymal atrophy. Patchy and ill-defined hypoattenuation within the cerebral white matter, nonspecific but compatible with mild chronic small vessel ischemic disease. Partially empty sella turcica. There is no acute intracranial hemorrhage. No demarcated cortical infarct. No extra-axial fluid collection. No evidence of an intracranial mass. No midline shift. Vascular: No hyperdense vessel.  Atherosclerotic calcifications. Skull: No calvarial fracture or aggressive osseous lesion. CT MAXILLOFACIAL FINDINGS Osseous: Age-indeterminate medially displaced fracture deformity of the right lamina papyracea (for instance as seen on series 8, image 28). No acute maxillofacial fracture identified elsewhere. Orbits: Right scleral buckle. Asymmetric increased density of the right globe, likely postprocedural. Age-indeterminate posterior lens dislocation on the right (series 4, image 48). Sinuses: Small mucous retention cyst within the right maxillary sinus.  Soft tissues: Right cheek laceration with underlying foci of subcutaneous gas. CT CERVICAL SPINE FINDINGS Alignment: Nonspecific straightening of the expected cervical lordosis. No significant spondylolisthesis. Skull base and vertebrae: The basion-dental and atlanto-dental intervals are not widened.No evidence of acute fracture to the cervical spine. Soft tissues and spinal canal: No prevertebral fluid or swelling. No visible canal hematoma. Disc levels: Cervical spondylosis with multilevel disc space narrowing, disc bulges/central disc protrusions, posterior disc osteophyte complexes, uncovertebral hypertrophy and facet arthrosis. Disc space narrowing is greatest at C5-C6 and T1-T2 (advanced at these levels). No appreciable high-grade spinal canal stenosis. Multilevel bony neural foraminal narrowing. Degenerative changes also present at the craniocervical junction and radiology this is caudal C1-C2 articulation. Multilevel ventral osteophytes. Upper chest: No consolidation within the imaged lung apices. No visible pneumothorax. Other: Thyroid nodules measuring up to 10 mm not meeting consensus criteria for ultrasound follow-up based on size. No follow-up imaging recommended. Reference: J Am Coll Radiol. 2015 Feb;12(2): 143-50. IMPRESSION: CT head: 1. No evidence of an acute intracranial abnormality. 2. Parenchymal atrophy and chronic small vessel ischemic disease. CT maxillofacial: 1. Age-indeterminate medially displaced fracture deformity of the right lamina papyracea. 2. Age-indeterminate posterior lens dislocation on the right. 3. Right scleral buckle. Asymmetric increased density of the right globe, presumably post-procedural. 4. Right cheek laceration with underlying foci of subcutaneous gas. 5. Small right maxillary sinus mucous retention cyst. CT cervical spine: 1. No evidence of an acute cervical spine fracture. 2. Nonspecific straightening of the expected cervical lordosis. 3. Cervical spondylosis as  described. Electronically Signed   By: Jackey Loge D.O.   On: 01/16/2024 12:57   CT MAXILLOFACIAL WO CONTRAST Result Date: 01/16/2024 CLINICAL DATA:  Provided history: Head trauma, minor. Facial trauma, blunt. Neck trauma. Additional history provided: Dizziness, nausea, syncopal episode (with fall and head trauma). Right eye hematoma. Emesis. EXAM: CT HEAD WITHOUT CONTRAST CT MAXILLOFACIAL WITHOUT CONTRAST CT CERVICAL SPINE WITHOUT CONTRAST TECHNIQUE: Multidetector CT imaging of the head, cervical spine, and maxillofacial structures were performed using the standard protocol without intravenous contrast. Multiplanar CT image reconstructions of the cervical spine and maxillofacial structures were also generated. RADIATION DOSE REDUCTION: This exam was performed  according to the departmental dose-optimization program which includes automated exposure control, adjustment of the mA and/or kV according to patient size and/or use of iterative reconstruction technique. COMPARISON:  None. FINDINGS: CT HEAD FINDINGS Brain: Generalized parenchymal atrophy. Patchy and ill-defined hypoattenuation within the cerebral white matter, nonspecific but compatible with mild chronic small vessel ischemic disease. Partially empty sella turcica. There is no acute intracranial hemorrhage. No demarcated cortical infarct. No extra-axial fluid collection. No evidence of an intracranial mass. No midline shift. Vascular: No hyperdense vessel.  Atherosclerotic calcifications. Skull: No calvarial fracture or aggressive osseous lesion. CT MAXILLOFACIAL FINDINGS Osseous: Age-indeterminate medially displaced fracture deformity of the right lamina papyracea (for instance as seen on series 8, image 28). No acute maxillofacial fracture identified elsewhere. Orbits: Right scleral buckle. Asymmetric increased density of the right globe, likely postprocedural. Age-indeterminate posterior lens dislocation on the right (series 4, image 48). Sinuses:  Small mucous retention cyst within the right maxillary sinus. Soft tissues: Right cheek laceration with underlying foci of subcutaneous gas. CT CERVICAL SPINE FINDINGS Alignment: Nonspecific straightening of the expected cervical lordosis. No significant spondylolisthesis. Skull base and vertebrae: The basion-dental and atlanto-dental intervals are not widened.No evidence of acute fracture to the cervical spine. Soft tissues and spinal canal: No prevertebral fluid or swelling. No visible canal hematoma. Disc levels: Cervical spondylosis with multilevel disc space narrowing, disc bulges/central disc protrusions, posterior disc osteophyte complexes, uncovertebral hypertrophy and facet arthrosis. Disc space narrowing is greatest at C5-C6 and T1-T2 (advanced at these levels). No appreciable high-grade spinal canal stenosis. Multilevel bony neural foraminal narrowing. Degenerative changes also present at the craniocervical junction and radiology this is caudal C1-C2 articulation. Multilevel ventral osteophytes. Upper chest: No consolidation within the imaged lung apices. No visible pneumothorax. Other: Thyroid nodules measuring up to 10 mm not meeting consensus criteria for ultrasound follow-up based on size. No follow-up imaging recommended. Reference: J Am Coll Radiol. 2015 Feb;12(2): 143-50. IMPRESSION: CT head: 1. No evidence of an acute intracranial abnormality. 2. Parenchymal atrophy and chronic small vessel ischemic disease. CT maxillofacial: 1. Age-indeterminate medially displaced fracture deformity of the right lamina papyracea. 2. Age-indeterminate posterior lens dislocation on the right. 3. Right scleral buckle. Asymmetric increased density of the right globe, presumably post-procedural. 4. Right cheek laceration with underlying foci of subcutaneous gas. 5. Small right maxillary sinus mucous retention cyst. CT cervical spine: 1. No evidence of an acute cervical spine fracture. 2. Nonspecific straightening of  the expected cervical lordosis. 3. Cervical spondylosis as described. Electronically Signed   By: Jackey Loge D.O.   On: 01/16/2024 12:57    Microbiology: Results for orders placed or performed in visit on 11/19/23  Microscopic Examination     Status: Abnormal   Collection Time: 11/19/23  9:25 AM  Result Value Ref Range Status   WBC, UA 6-10 (A) 0 - 5 /hpf Final   RBC, Urine None seen 0 - 2 /hpf Final   Epithelial Cells (non renal) 0-10 0 - 10 /hpf Final   Casts None seen None seen /lpf Final   Crystals Present (A) N/A Final   Crystal Type Calcium Oxalate N/A Final   Bacteria, UA Few None seen/Few Final    Labs: CBC: No results for input(s): "WBC", "NEUTROABS", "HGB", "HCT", "MCV", "PLT" in the last 168 hours. Basic Metabolic Panel: No results for input(s): "NA", "K", "CL", "CO2", "GLUCOSE", "BUN", "CREATININE", "CALCIUM", "MG", "PHOS" in the last 168 hours. Liver Function Tests: No results for input(s): "AST", "ALT", "ALKPHOS", "BILITOT", "PROT", "ALBUMIN" in  the last 168 hours. CBG: No results for input(s): "GLUCAP" in the last 168 hours.  Discharge time spent: less than 30 minutes.  Signed: Verdene Lennert, MD [facility] 01/26/2024

## 2024-02-03 ENCOUNTER — Ambulatory Visit (INDEPENDENT_AMBULATORY_CARE_PROVIDER_SITE_OTHER): Admitting: Internal Medicine

## 2024-02-03 ENCOUNTER — Encounter: Payer: Self-pay | Admitting: Internal Medicine

## 2024-02-03 ENCOUNTER — Ambulatory Visit: Attending: Internal Medicine

## 2024-02-03 VITALS — BP 122/78 | HR 68 | Ht 65.0 in | Wt 157.0 lb

## 2024-02-03 DIAGNOSIS — S0285XA Fracture of orbit, unspecified, initial encounter for closed fracture: Secondary | ICD-10-CM | POA: Diagnosis not present

## 2024-02-03 DIAGNOSIS — R55 Syncope and collapse: Secondary | ICD-10-CM

## 2024-02-03 NOTE — Progress Notes (Signed)
 Date:  02/03/2024   Name:  Kristy Sellers   DOB:  [birth year]   MRN:  [id number]   Chief Complaint: Hospitalization Follow-up Seen in ER and was recommended to be admitted.  She decided to leave AMA because they wanted to put her in a double room until the ECHO could be done. She fell after having vertigo and hit her head and face. CT:  IMPRESSION: CT head: 1. No evidence of an acute intracranial abnormality. 2. Parenchymal atrophy and chronic small vessel ischemic disease.   CT maxillofacial:  1. Age-indeterminate medially displaced fracture deformity of the right lamina papyracea. 2. Age-indeterminate posterior lens dislocation on the right. 3. Right scleral buckle. Asymmetric increased density of the right globe, presumably post-procedural. 4. Right cheek laceration with underlying foci of subcutaneous gas. 5. Small right maxillary sinus mucous retention cyst.   CT cervical spine: 1. No evidence of an acute cervical spine fracture. 2. Nonspecific straightening of the expected cervical lordosis. 3. Cervical spondylosis as described.  HPI  Review of Systems  Constitutional:  Negative for fatigue and unexpected weight change.  HENT:  Negative for nosebleeds.   Eyes:  Negative for visual disturbance.  Respiratory:  Negative for cough, chest tightness, shortness of breath and wheezing.   Cardiovascular:  Negative for chest pain, palpitations and leg swelling.  Gastrointestinal:  Negative for abdominal pain, constipation and diarrhea.  Neurological:  Negative for dizziness, weakness, light-headedness and headaches.  Psychiatric/Behavioral:  Negative for dysphoric mood and sleep disturbance. The patient is not nervous/anxious.      Lab Results  Component Value Date   NA 140 01/16/2024   K 3.5 01/16/2024   CO2 29 01/16/2024   GLUCOSE 106 (H) 01/16/2024   BUN 16 01/16/2024   CREATININE 0.91 01/16/2024   CALCIUM 8.8 (L) 01/16/2024   EGFR 82 11/19/2023   GFRNONAA >60  01/16/2024   Lab Results  Component Value Date   CHOL 182 11/19/2023   HDL 72 11/19/2023   LDLCALC 92 11/19/2023   TRIG 101 11/19/2023   CHOLHDL 2.5 11/19/2023   Lab Results  Component Value Date   TSH 3.288 01/16/2024   No results found for: HGBA1C Lab Results  Component Value Date   WBC 10.5 01/16/2024   HGB 12.3 01/16/2024   HCT 41.6 01/16/2024   MCV 84.0 01/16/2024   PLT 272 01/16/2024   Lab Results  Component Value Date   ALT 8 11/19/2023   AST 18 11/19/2023   ALKPHOS 69 11/19/2023   BILITOT 0.4 11/19/2023   No results found for: MARIEN BOLLS, VD25OH   Patient Active Problem List   Diagnosis Date Noted   Closed fracture of orbit (HCC) 02/03/2024   Syncope and collapse 01/16/2024   Closed lamina papyracea fracture (HCC) 01/16/2024   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 01/16/2024   Unqualified visual loss of right eye with normal vision of contralateral eye 12/04/2023   Encounter for surgical aftercare following surgery of digestive system 10/26/2023   Encounter for long-term (current) use of aspirin 10/26/2023   Personal history of fall 10/26/2023   Mild mood disorder (HCC) 10/26/2023   Leucocytosis 09/30/2023   Hypokalemia 09/30/2023   Gastroesophageal reflux disease without esophagitis 05/14/2020   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   Chronic lumbar radiculopathy 10/15/2017   Arthritis of knee 10/15/2017   Primary osteoarthritis of left hip 10/15/2017   Mixed hyperlipidemia 06/11/2016   History of Clostridium difficile colitis 06/11/2016   History of positive PPD  06/11/2016   Pseudophakia 05/18/2016   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   Ocular hypertension 05/18/2016   Cataract 05/18/2016   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   Secondary hypotony of eye 09/27/2014   Essential hypertension 09/27/2014    No Known Allergies  Past Surgical History:  Procedure Laterality Date   ABDOMINAL HYSTERECTOMY     BOWEL RESECTION  10/01/2023    Procedure: SMALL BOWEL RESECTION;  Surgeon: Marinda Jayson KIDD, MD;  Location: [facility] ORS;  Service: General;;   COLONOSCOPY  ~2006   normal   LAPAROTOMY N/A 10/01/2023   Procedure: EXPLORATORY LAPAROTOMY;  Surgeon: Marinda Jayson KIDD, MD;  Location: [facility] ORS;  Service: General;  Laterality: N/A;   LYSIS OF ADHESION  10/01/2023   Procedure: LYSIS OF ADHESION;  Surgeon: Marinda Jayson KIDD, MD;  Location: [facility] ORS;  Service: General;;   RETINAL DETACHMENT SURGERY Right     Social History   Tobacco Use   Smoking status: Former    Current packs/day: 0.00    Average packs/day: 0.5 packs/day for 20.0 years (10.0 ttl pk-yrs)    Types: Cigarettes    Start date: 12/30/1963    Quit date: 12/30/1983    Years since quitting: 40.1   Smokeless tobacco: Never  Vaping Use   Vaping status: Never Used  Substance Use Topics   Alcohol use: No    Alcohol/week: 0.0 standard drinks of alcohol   Drug use: Never     Medication list has been reviewed and updated.  Current Meds  Medication Sig   amLODipine  (NORVASC ) 5 MG tablet TAKE 1 TABLET (5 MG TOTAL) BY MOUTH DAILY.   aspirin 81 MG tablet Take 81 mg by mouth at bedtime as needed.   atropine  1 % ophthalmic solution    docusate sodium  (COLACE) 100 MG capsule Take 1 capsule (100 mg total) by mouth 2 (two) times daily. Hold if you have diarrhea   polyethylene glycol (MIRALAX  / GLYCOLAX ) 17 g packet Take 17 g by mouth daily. Hold if you have diarrhea   prednisoLONE  acetate (PRED FORTE ) 1 % ophthalmic suspension    simvastatin  (ZOCOR ) 10 MG tablet TAKE 1 TABLET BY MOUTH EVERY DAY (Patient taking differently: Take 10 mg by mouth at bedtime.)       02/03/2024    2:00 PM 11/19/2023    8:37 AM 10/14/2023    2:01 PM 05/22/2023    9:06 AM  GAD 7 : Generalized Anxiety Score  Nervous, Anxious, on Edge 0 0 2 0  Control/stop worrying 0 0 2 0  Worry too much - different things 0 0 2 0  Trouble relaxing 0 0 0 0  Restless 0 0 0 0  Easily annoyed or irritable 0 0 0 0   Afraid - awful might happen 1 0 0 0  Total GAD 7 Score 1 0 6 0  Anxiety Difficulty Not difficult at all Not difficult at all Somewhat difficult Not difficult at all       02/03/2024    2:00 PM 11/19/2023    8:37 AM 10/14/2023    2:00 PM  Depression screen PHQ 2/9  Decreased Interest 0 0 3  Down, Depressed, Hopeless 0 0 3  PHQ - 2 Score 0 0 6  Altered sleeping 2 3 3   Tired, decreased energy 0 2 2  Change in appetite 0 0 0  Feeling bad or failure about yourself  0 0 0  Trouble concentrating 0 0 0  Moving slowly or fidgety/restless 0 0 0  Suicidal thoughts  0 0 0  PHQ-9 Score 2 5 11   Difficult doing work/chores Not difficult at all Not difficult at all Not difficult at all    BP Readings from Last 3 Encounters:  02/03/24 122/78  01/17/24 (!) 164/92  11/19/23 128/74    Physical Exam Vitals and nursing note reviewed.  Constitutional:      General: She is not in acute distress.    Appearance: She is well-developed.  HENT:     Head: Normocephalic and atraumatic.  Cardiovascular:     Rate and Rhythm: Normal rate and regular rhythm.     Heart sounds: No murmur heard. Pulmonary:     Effort: Pulmonary effort is normal. No respiratory distress.  Musculoskeletal:        General: Normal range of motion.     Cervical back: Normal range of motion.     Right lower leg: No edema.     Left lower leg: No edema.  Skin:    General: Skin is warm and dry.     Findings: No rash.  Neurological:     General: No focal deficit present.     Mental Status: She is alert and oriented to person, place, and time.  Psychiatric:        Mood and Affect: Mood normal.        Behavior: Behavior normal.     Wt Readings from Last 3 Encounters:  02/03/24 157 lb (71.2 kg)  01/16/24 149 lb (67.6 kg)  11/19/23 150 lb (68 kg)    BP 122/78   Pulse 68   Ht 5' 5 (1.651 m)   Wt 157 lb (71.2 kg)   SpO2 97%   BMI 26.13 kg/m   Assessment and Plan:  Problem List Items Addressed This Visit        Unprioritized   Syncope and collapse   Onset with diaphoresis and then vertigo It is has not recurred Patient did not stay for recommended ECHO Will order ECHO and Zio monitor      Relevant Orders   ECHOCARDIOGRAM COMPLETE   LONG TERM MONITOR (3-14 DAYS)   Closed fracture of orbit (HCC) - Primary   Expected to heal without intervention per Ophthalmology       No follow-ups on file.    Leita HILARIO Adie, MD [facility] [place]

## 2024-02-03 NOTE — Assessment & Plan Note (Signed)
Expected to heal without intervention per Ophthalmology

## 2024-02-03 NOTE — Assessment & Plan Note (Signed)
 Onset with diaphoresis and then vertigo It is has not recurred Patient did not stay for recommended ECHO Will order ECHO and Zio monitor

## 2024-03-01 ENCOUNTER — Ambulatory Visit
Admission: RE | Admit: 2024-03-01 | Discharge: 2024-03-01 | Disposition: A | Discharge status: Home / Self Care | Source: Ambulatory Visit | Attending: Internal Medicine | Admitting: Internal Medicine

## 2024-03-01 DIAGNOSIS — E785 Hyperlipidemia, unspecified: Secondary | ICD-10-CM | POA: Diagnosis not present

## 2024-03-01 DIAGNOSIS — I10 Essential (primary) hypertension: Secondary | ICD-10-CM | POA: Insufficient documentation

## 2024-03-01 DIAGNOSIS — R55 Syncope and collapse: Secondary | ICD-10-CM | POA: Diagnosis present

## 2024-03-01 DIAGNOSIS — I340 Nonrheumatic mitral (valve) insufficiency: Secondary | ICD-10-CM | POA: Insufficient documentation

## 2024-03-01 LAB — ECHOCARDIOGRAM COMPLETE
AR max vel: 2.46 cm2
AV Area VTI: 2.88 cm2
AV Area mean vel: 2.53 cm2
AV Mean grad: 3 mmHg
AV Peak grad: 4.8 mmHg
Ao pk vel: 1.1 m/s
Area-P 1/2: 2.43 cm2
MV VTI: 2.63 cm2
S' Lateral: 3 cm

## 2024-03-01 NOTE — Progress Notes (Signed)
*  PRELIMINARY RESULTS* Echocardiogram 2D Echocardiogram has been performed.  Kristy Sellers 03/01/2024, 11:46 AM

## 2024-03-02 ENCOUNTER — Encounter: Payer: Self-pay | Admitting: Internal Medicine

## 2024-03-22 ENCOUNTER — Telehealth: Payer: Self-pay | Admitting: Internal Medicine

## 2024-03-22 NOTE — Telephone Encounter (Signed)
 Called patient no answer. No voicemail left.

## 2024-03-22 NOTE — Telephone Encounter (Signed)
 Copied from CRM [id number]. Topic: General - Other >> Mar 21, 2024  4:34 PM Emylou G wrote: Reason for CRM: Zio monitor completed - letter [employer] saying they are sending her another one?  She said she doesn't need it.. Please advise?  Pls call her: [phone]

## 2024-03-23 NOTE — Telephone Encounter (Signed)
 Spoke with patient, she wanted to let her provider know that the company that provided the heart monitor sent her a letter that she was going to received another heart monitor. Patient said she did not request another and and wasn't sure if Dr. Judithann Graves requested another one. She said that she does not plan on paying it out of pocket nor does she believe insurance would pay for it.

## 2024-03-24 NOTE — Telephone Encounter (Signed)
 Left voicemail for patient letting her know the message from PCP.

## 2024-03-31 ENCOUNTER — Telehealth: Payer: Self-pay | Admitting: Internal Medicine

## 2024-03-31 ENCOUNTER — Telehealth: Payer: Self-pay

## 2024-03-31 NOTE — Telephone Encounter (Signed)
 Please review.  KP Copied from CRM [id number]. Topic: General - Call Back - No Documentation >> Mar 31, 2024  1:51 PM Benay Spice S wrote: Reason for CRM: Patient returning call to [facility]. Please contact patient back [phone]

## 2024-03-31 NOTE — Telephone Encounter (Signed)
 Copied from CRM [id number]. Topic: Medicare AWV >> Mar 31, 2024  1:45 PM Payton Doughty wrote: Reason for CRM: Called 03/31/2024 to sched AWV - NO VOICEMAIL  Verlee Rossetti; Care Guide Ambulatory Clinical Support [place] [facility] Direct Dial: [phone]

## 2024-03-31 NOTE — Telephone Encounter (Signed)
 Spoke with patient. Clarified her concerns with the previous message about the heart monitor. Pt verbalized understanding

## 2024-05-03 ENCOUNTER — Other Ambulatory Visit: Payer: Self-pay | Admitting: Internal Medicine

## 2024-05-03 DIAGNOSIS — I10 Essential (primary) hypertension: Secondary | ICD-10-CM

## 2024-05-05 ENCOUNTER — Encounter: Payer: Self-pay | Admitting: Internal Medicine

## 2024-05-05 ENCOUNTER — Ambulatory Visit (INDEPENDENT_AMBULATORY_CARE_PROVIDER_SITE_OTHER): Payer: Self-pay | Admitting: Internal Medicine

## 2024-05-05 VITALS — BP 124/72 | HR 67 | Ht 65.0 in | Wt 157.4 lb

## 2024-05-05 DIAGNOSIS — I10 Essential (primary) hypertension: Secondary | ICD-10-CM | POA: Diagnosis not present

## 2024-05-05 MED ORDER — AMLODIPINE BESYLATE 5 MG PO TABS: 5.0000 mg | ORAL_TABLET | Freq: Every day | ORAL | 3 refills | Status: AC

## 2024-05-05 NOTE — Assessment & Plan Note (Signed)
 Blood pressure is well controlled.  Current medication is amlodipine  5 mg. Will continue same regimen along with efforts to limit dietary sodium.

## 2024-05-05 NOTE — Progress Notes (Signed)
 Date:  05/05/2024   Name:  Kristy Sellers   DOB:  02-18-1941   MRN:  [id number]   Chief Complaint: Hypertension  Hypertension This is a chronic problem. The problem is controlled. Pertinent negatives include no chest pain, headaches, palpitations or shortness of breath. Past treatments include calcium channel blockers.  Syncope - not recurrent.  ECHO normal Monitor - few runs of SVT otherwise normal.  Review of Systems  Constitutional:  Negative for fatigue and unexpected weight change.  HENT:  Positive for facial swelling (healing fracture of right lamina papyracea). Negative for trouble swallowing.   Eyes:  Negative for visual disturbance.  Respiratory:  Negative for cough, chest tightness, shortness of breath and wheezing.   Cardiovascular:  Negative for chest pain, palpitations and leg swelling.  Gastrointestinal:  Negative for abdominal pain, constipation and diarrhea.  Musculoskeletal:  Negative for arthralgias and myalgias.  Neurological:  Negative for dizziness, weakness, light-headedness and headaches.     Lab Results  Component Value Date   NA 140 01/16/2024   K 3.5 01/16/2024   CO2 29 01/16/2024   GLUCOSE 106 (H) 01/16/2024   BUN 16 01/16/2024   CREATININE 0.91 01/16/2024   CALCIUM 8.8 (L) 01/16/2024   EGFR 82 11/19/2023   GFRNONAA >60 01/16/2024   Lab Results  Component Value Date   CHOL 182 11/19/2023   HDL 72 11/19/2023   LDLCALC 92 11/19/2023   TRIG 101 11/19/2023   CHOLHDL 2.5 11/19/2023   Lab Results  Component Value Date   TSH 3.288 01/16/2024   No results found for: "HGBA1C" Lab Results  Component Value Date   WBC 10.5 01/16/2024   HGB 12.3 01/16/2024   HCT 41.6 01/16/2024   MCV 84.0 01/16/2024   PLT 272 01/16/2024   Lab Results  Component Value Date   ALT 8 11/19/2023   AST 18 11/19/2023   ALKPHOS 69 11/19/2023   BILITOT 0.4 11/19/2023   No results found for: "25OHVITD2", "25OHVITD3", "VD25OH"   Patient Active Problem List    Diagnosis Date Noted   Closed fracture of orbit (HCC) 02/03/2024   Closed lamina papyracea fracture (HCC) 01/16/2024   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 01/16/2024   Unqualified visual loss of right eye with normal vision of contralateral eye 12/04/2023   Encounter for long-term (current) use of aspirin 10/26/2023   Personal history of fall 10/26/2023   Mild mood disorder (HCC) 10/26/2023   Gastroesophageal reflux disease without esophagitis 05/14/2020   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   Chronic lumbar radiculopathy 10/15/2017   Arthritis of knee 10/15/2017   Primary osteoarthritis of left hip 10/15/2017   Mixed hyperlipidemia 06/11/2016   History of Clostridium difficile colitis 06/11/2016   History of positive PPD 06/11/2016   Pseudophakia 05/18/2016   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   Ocular hypertension 05/18/2016   Cataract 05/18/2016   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   Secondary hypotony of eye 09/27/2014   Essential hypertension 09/27/2014    No Known Allergies  Past Surgical History:  Procedure Laterality Date   ABDOMINAL HYSTERECTOMY     BOWEL RESECTION  10/01/2023   Procedure: SMALL BOWEL RESECTION;  Surgeon: Barrett Lick, MD;  Location: [facility] ORS;  Service: General;;   COLONOSCOPY  ~2006   normal   LAPAROTOMY N/A 10/01/2023   Procedure: EXPLORATORY LAPAROTOMY;  Surgeon: Barrett Lick, MD;  Location: [facility] ORS;  Service: General;  Laterality: N/A;   LYSIS OF ADHESION  10/01/2023   Procedure: LYSIS OF ADHESION;  Surgeon: Barrett Lick, MD;  Location: [facility] ORS;  Service: General;;   RETINAL DETACHMENT SURGERY Right     Social History   Tobacco Use   Smoking status: Former    Current packs/day: 0.00    Average packs/day: 0.5 packs/day for 20.0 years (10.0 ttl pk-yrs)    Types: Cigarettes    Start date: 12/30/1963    Quit date: 12/30/1983    Years since quitting: 40.3   Smokeless tobacco: Never  Vaping Use   Vaping status: Never Used  Substance Use  Topics   Alcohol use: No    Alcohol/week: 0.0 standard drinks of alcohol   Drug use: Never     Medication list has been reviewed and updated.  Current Meds  Medication Sig   aspirin 81 MG tablet Take 81 mg by mouth at bedtime as needed.   atropine  1 % ophthalmic solution    docusate sodium  (COLACE) 100 MG capsule Take 1 capsule (100 mg total) by mouth 2 (two) times daily. Hold if you have diarrhea   polyethylene glycol (MIRALAX  / GLYCOLAX ) 17 g packet Take 17 g by mouth daily. Hold if you have diarrhea   prednisoLONE  acetate (PRED FORTE ) 1 % ophthalmic suspension    simvastatin  (ZOCOR ) 10 MG tablet TAKE 1 TABLET BY MOUTH EVERY DAY (Patient taking differently: Take 10 mg by mouth at bedtime.)   [DISCONTINUED] amLODipine  (NORVASC ) 5 MG tablet TAKE 1 TABLET (5 MG TOTAL) BY MOUTH DAILY.       02/03/2024    2:00 PM 11/19/2023    8:37 AM 10/14/2023    2:01 PM 05/22/2023    9:06 AM  GAD 7 : Generalized Anxiety Score  Nervous, Anxious, on Edge 0 0 2 0  Control/stop worrying 0 0 2 0  Worry too much - different things 0 0 2 0  Trouble relaxing 0 0 0 0  Restless 0 0 0 0  Easily annoyed or irritable 0 0 0 0  Afraid - awful might happen 1 0 0 0  Total GAD 7 Score 1 0 6 0  Anxiety Difficulty Not difficult at all Not difficult at all Somewhat difficult Not difficult at all       02/03/2024    2:00 PM 11/19/2023    8:37 AM 10/14/2023    2:00 PM  Depression screen PHQ 2/9  Decreased Interest 0 0 3  Down, Depressed, Hopeless 0 0 3  PHQ - 2 Score 0 0 6  Altered sleeping 2 3 3   Tired, decreased energy 0 2 2  Change in appetite 0 0 0  Feeling bad or failure about yourself  0 0 0  Trouble concentrating 0 0 0  Moving slowly or fidgety/restless 0 0 0  Suicidal thoughts 0 0 0  PHQ-9 Score 2 5 11   Difficult doing work/chores Not difficult at all Not difficult at all Not difficult at all    BP Readings from Last 3 Encounters:  05/05/24 124/72  02/03/24 122/78  01/17/24 (!) 164/92     Physical Exam Vitals and nursing note reviewed.  Constitutional:      General: She is not in acute distress.    Appearance: Normal appearance. She is well-developed.  HENT:     Head: Normocephalic and atraumatic.      Comments: Mild soft tissue swelling without tenderness  Cardiovascular:     Rate and Rhythm: Normal rate and regular rhythm.  Pulmonary:     Effort: Pulmonary effort is normal. No respiratory distress.  Breath sounds: No wheezing or rhonchi.  Musculoskeletal:     Cervical back: Normal range of motion.     Right lower leg: No edema.     Left lower leg: No edema.  Skin:    General: Skin is warm and dry.     Findings: No rash.  Neurological:     Mental Status: She is alert and oriented to person, place, and time.  Psychiatric:        Mood and Affect: Mood normal.        Behavior: Behavior normal.     Wt Readings from Last 3 Encounters:  05/05/24 157 lb 6 oz (71.4 kg)  02/03/24 157 lb (71.2 kg)  01/16/24 149 lb (67.6 kg)    BP 124/72   Pulse 67   Ht 5\' 5"  (1.651 m)   Wt 157 lb 6 oz (71.4 kg)   SpO2 97%   BMI 26.19 kg/m   Assessment and Plan:  Problem List Items Addressed This Visit       Unprioritized   Essential hypertension - Primary (Chronic)   Blood pressure is well controlled.  Current medication is amlodipine  5 mg. Will continue same regimen along with efforts to limit dietary sodium.       Relevant Medications   amLODipine  (NORVASC ) 5 MG tablet    Return in about 6 months (around 11/05/2024) for CPX.    Sheron Dixons, MD [facility] [place]

## 2024-05-05 NOTE — Telephone Encounter (Signed)
 Duplicate request- filled today-05/05/24 #[id number] Requested Prescriptions  Pending Prescriptions Disp Refills   amLODipine  (NORVASC ) 5 MG tablet [Pharmacy Med Name: AMLODIPINE  BESYLATE 5 MG TAB] 90 tablet 3    Sig: TAKE 1 TABLET (5 MG TOTAL) BY MOUTH DAILY.     Cardiovascular: Calcium Channel Blockers 2 Passed - 05/05/2024  1:22 PM      Passed - Last BP in normal range    BP Readings from Last 1 Encounters:  05/05/24 124/72         Passed - Last Heart Rate in normal range    Pulse Readings from Last 1 Encounters:  05/05/24 67         Passed - Valid encounter within last 6 months    Recent Outpatient Visits           Today Essential hypertension   [place] [facility] at [facility], Chales Colorado, MD   3 months ago Closed fracture of orbit, initial encounter [facility])   [place] [facility] at [facility], Chales [place], MD       Future Appointments             In 6 months Gala Jubilee, Chales [place], MD [facility] at [facility]

## 2024-08-26 ENCOUNTER — Ambulatory Visit: Payer: Self-pay

## 2024-08-26 ENCOUNTER — Encounter: Payer: Self-pay | Admitting: Family Medicine

## 2024-08-26 ENCOUNTER — Ambulatory Visit: Admitting: Family Medicine

## 2024-08-26 ENCOUNTER — Ambulatory Visit
Admission: EM | Admit: 2024-08-26 | Discharge: 2024-08-26 | Disposition: A | Discharge status: Home / Self Care | Attending: Emergency Medicine | Admitting: Emergency Medicine

## 2024-08-26 ENCOUNTER — Ambulatory Visit (INDEPENDENT_AMBULATORY_CARE_PROVIDER_SITE_OTHER)

## 2024-08-26 VITALS — BP 112/74 | HR 62 | Ht 65.0 in | Wt 162.3 lb

## 2024-08-26 DIAGNOSIS — M79645 Pain in left finger(s): Secondary | ICD-10-CM

## 2024-08-26 DIAGNOSIS — S42202A Unspecified fracture of upper end of left humerus, initial encounter for closed fracture: Secondary | ICD-10-CM

## 2024-08-26 DIAGNOSIS — S42292A Other displaced fracture of upper end of left humerus, initial encounter for closed fracture: Secondary | ICD-10-CM

## 2024-08-26 DIAGNOSIS — W19XXXA Unspecified fall, initial encounter: Secondary | ICD-10-CM | POA: Diagnosis not present

## 2024-08-26 DIAGNOSIS — M1812 Unilateral primary osteoarthritis of first carpometacarpal joint, left hand: Secondary | ICD-10-CM

## 2024-08-26 MED ORDER — VITAMIN D (ERGOCALCIFEROL) 1.25 MG (50000 UNIT) PO CAPS: 50000.0000 [IU] | ORAL_CAPSULE | ORAL | 0 refills | Status: AC

## 2024-08-26 NOTE — Patient Instructions (Signed)
 Patient Plan for Post-Visit Guidance  Left Shoulder Fracture - Wear a sling during all wakeful hours to keep the shoulder immobilized. - Use your right hand for daily activities to avoid moving the left shoulder. - Remove the sling once daily for gentle elbow-only motion to prevent stiffness. - After one week, begin gentle pendulum exercises if comfortable. - Take vitamin D  50,000 IU once weekly for 8 weeks. - Take calcium 1000 mg daily for 8 weeks; watch for constipation. - Use Voltaren gel for shoulder pain as needed, up to 4 times per day, and use sparingly. - Apply ice or heat to the shoulder for pain relief as needed. - Schedule a follow-up appointment in two weeks with a new x-ray before the visit.  Mobility and Daily Activities - Get help with daily activities such as dressing and bathing for the first two weeks. - Avoid activities that require significant use of the left arm. - Stay with family or have family stay with you for support during the initial recovery period.  Left Thumb Pain (Arthritis) - Apply Voltaren gel to the left thumb up to four times a day for pain. - Wash hands after using Voltaren gel.  Red Flags - Seek medical attention if you experience severe pain, numbness, tingling, loss of movement in the arm or hand, fever, swelling, or any new or worsening symptoms.

## 2024-08-26 NOTE — Assessment & Plan Note (Signed)
 Left thumb arthralgia - Onset after fall this AM (see additional assessment for details) - Soreness in the left thumb attributed to underlying arthritis - Able to move the thumb independently despite pain - No evidence of fracture  Physical exam LEFT THUMB (FIRST CMC) INSPECTION: No swelling, erythema, or ecchymosis noted PALPATION: Bony prominence and focal tenderness at the first [facility] joint RANGE OF MOTION: Thumb range of motion preserved STRENGTH: Motor units at the first [facility] fire independently; grip and pinch strength preserved though limited by discomfort NEUROVASCULAR: Sensation intact to light touch throughout the thumb; capillary refill <2 seconds  Primary osteoarthritis of the left first carpometacarpal joint Primary osteoarthritis of the left first carpometacarpal joint with tenderness and pain exacerbated by recent fall. No fractures noted in the thumb or hand. - Instruct to apply Voltaren gel up to four times a day for thumb pain. - Advise to wash hands after application of Voltaren gel.

## 2024-08-26 NOTE — Assessment & Plan Note (Addendum)
 History of Present Illness Kristy Sellers is an 83 year old female who presents with a left shoulder injury following a fall. She is accompanied by her daughter.  Left shoulder pain and injury - Acute onset of left shoulder pain following a fall at approximately 9:45 AM today while walking at a center - [place] directly onto the left shoulder after being startled by a bug - No loss of consciousness or head injury - Pain localized primarily to the left shoulder, especially in the axillary region - Increased pain with attempts to get out of a chair independently - Soreness under the left arm - Pain is manageable when the arm is at rest and supported in a sling - Left-handed, resulting in significant impact on daily activities  Physical Exam LEFT SHOULDER INSPECTION: Subtle focal ecchymosis present at the lateral deltoid; no axillary ecchymosis PALPATION: Clavicle and AC joint nontender without deformity; humeral head/neck junction with subtle bony prominence and tenderness RANGE OF MOTION: Limited external rotation and forward flexion secondary to pain; internal rotation preserved STRENGTH: Motor units at the shoulder fire independently with abduction and extension; strength limited by pain NEUROVASCULAR: Sensation intact at the lateral deltoid; distal motor and vascular exam grossly intact  Assessment and Plan Minimally displaced fracture of the left proximal humerus head neck junction Minimally displaced fracture of the left proximal humerus sustained from a fall earlier today with direct lateral blow to the shoulder described. The fracture is in good alignment and we will pursue nonsurgical management. Expected healing time is approximately three months. Pain is localized to the left shoulder and axillary region, with some limitation in shoulder movement due to pain. No neurological deficits noted. - Instruct to wear a sling during all wakeful hours to immobilize the shoulder. - Advise to use  the right hand for daily activities to minimize movement of the left shoulder. - Come out of sling at least once daily for gentle elbow only motion to prevent stiffness. - Recommend gentle pendulum exercises after one week if comfortable. - Prescribe vitamin D  supplementation.  Dose 1 capsule (50,000 IU) weekly x 8 weeks. - Advise to take calcium supplements.  Dose 1000 mg daily x 8 weeks.  Watch for constipation. - Instruct to use Voltaren gel for pain management as needed, up to 4 times per day, use sparingly. - Advise to use ice and heat for pain management as needed. - Schedule follow-up appointment in two weeks with a new x-ray prior to the visit.  Reduced mobility and difficulty with activities of daily living due to left arm injury Reduced mobility and difficulty with activities of daily living due to left arm injury. The left arm is the dominant side, complicating daily tasks. Emphasized the need for support during the initial two weeks post-injury to prevent complications and promote healing. - Recommend assistance with daily activities, including dressing and bathing, for the first two weeks. - Advise to avoid activities that require significant use of the left arm. - Suggest staying with family or having family stay with her for support during the initial recovery period.

## 2024-08-26 NOTE — Progress Notes (Signed)
 Primary Care / Sports Medicine Office Visit  Patient Information:  Patient ID: Kristy Sellers, female DOB: 1941-08-08 Age: 83 y.o. MRN: [id number]   Kristy Sellers is a pleasant 83 y.o. female presenting with the following:  Chief Complaint  Patient presents with   Shoulder Injury    Left shoulder fx happened today in a fall.Patient was walking and was scared from a bug and took a fall landing on her left shoulder. She has only had 500 mg Tylenol  since fall. She is in a sling since going to UC.     Vitals:   08/26/24 1315  BP: 112/74  Pulse: 62  SpO2: 100%   Vitals:   08/26/24 1315  Weight: 162 lb 4.8 oz (73.6 kg)  Height: 5' 5 (1.651 m)   Body mass index is 27.01 kg/m.  DG Finger Thumb Left Result Date: 08/26/2024 CLINICAL DATA:  83 year old female status post fall.  Pain. EXAM: LEFT THUMB 2+V COMPARISON:  None Available. FINDINGS: Three views a left thumb. 1st CMC joint space loss and degeneration is moderate to severe. No superimposed acute fracture or dislocation is identified. IMPRESSION: 1. No acute fracture or dislocation identified about the left thumb. 2. Moderate to severe 1st CMC joint degeneration. Electronically Signed   By: VEAR Hurst M.D.   On: 08/26/2024 12:08   DG Shoulder Left Result Date: 08/26/2024 CLINICAL DATA:  83 year old female status post fall.  Pain. EXAM: LEFT SHOULDER - 2+ VIEW COMPARISON:  None Available. FINDINGS: Four views 1123 hours. Minimally displaced proximal left humerus fracture, appears mildly comminuted and is most visible along the lateral neck of the humerus. Glenohumeral alignment appears maintained. Visible left clavicle and scapula appear intact. Visible left ribs and chest appear negative. IMPRESSION: Minimally displaced proximal left humerus fracture at the neck. No glenohumeral dislocation. Electronically Signed   By: VEAR Hurst M.D.   On: 08/26/2024 12:07     Independent interpretation of notes and tests performed by another provider:    Independent interpretation of left shoulder and left thumb x-rays dated 8/3 09/17/2024 below: Left shoulder X-ray: Fracture of the humeral head and neck junction, preserved alignment, no significant displacement. (08/26/2024) Left thumb X-ray: No fracture, presence of 1st CMC arthritis. (08/26/2024)  Procedures performed:   None  Pertinent History, Exam, Impression, and Recommendations:   Problem List Items Addressed This Visit     Humeral head fracture, left, closed, initial encounter - Primary   History of Present Illness Kristy Sellers is an 83 year old female who presents with a left shoulder injury following a fall. She is accompanied by her daughter.  Left shoulder pain and injury - Acute onset of left shoulder pain following a fall at approximately 9:45 AM today while walking at a center - [place] directly onto the left shoulder after being startled by a bug - No loss of consciousness or head injury - Pain localized primarily to the left shoulder, especially in the axillary region - Increased pain with attempts to get out of a chair independently - Soreness under the left arm - Pain is manageable when the arm is at rest and supported in a sling - Left-handed, resulting in significant impact on daily activities  Physical Exam LEFT SHOULDER INSPECTION: Subtle focal ecchymosis present at the lateral deltoid; no axillary ecchymosis PALPATION: Clavicle and AC joint nontender without deformity; humeral head/neck junction with subtle bony prominence and tenderness RANGE OF MOTION: Limited external rotation and forward flexion secondary to pain; internal rotation preserved STRENGTH: Motor  units at the shoulder fire independently with abduction and extension; strength limited by pain NEUROVASCULAR: Sensation intact at the lateral deltoid; distal motor and vascular exam grossly intact  Assessment and Plan Minimally displaced fracture of the left proximal humerus head neck  junction Minimally displaced fracture of the left proximal humerus sustained from a fall earlier today with direct lateral blow to the shoulder described. The fracture is in good alignment and we will pursue nonsurgical management. Expected healing time is approximately three months. Pain is localized to the left shoulder and axillary region, with some limitation in shoulder movement due to pain. No neurological deficits noted. - Instruct to wear a sling during all wakeful hours to immobilize the shoulder. - Advise to use the right hand for daily activities to minimize movement of the left shoulder. - Come out of sling at least once daily for gentle elbow only motion to prevent stiffness. - Recommend gentle pendulum exercises after one week if comfortable. - Prescribe vitamin D  supplementation.  Dose 1 capsule (50,000 IU) weekly x 8 weeks. - Advise to take calcium supplements.  Dose 1000 mg daily x 8 weeks.  Watch for constipation. - Instruct to use Voltaren gel for pain management as needed, up to 4 times per day, use sparingly. - Advise to use ice and heat for pain management as needed. - Schedule follow-up appointment in two weeks with a new x-ray prior to the visit.  Reduced mobility and difficulty with activities of daily living due to left arm injury Reduced mobility and difficulty with activities of daily living due to left arm injury. The left arm is the dominant side, complicating daily tasks. Emphasized the need for support during the initial two weeks post-injury to prevent complications and promote healing. - Recommend assistance with daily activities, including dressing and bathing, for the first two weeks. - Advise to avoid activities that require significant use of the left arm. - Suggest staying with family or having family stay with her for support during the initial recovery period.      Primary osteoarthritis of first carpometacarpal joint of left hand   Left thumb  arthralgia - Onset after fall this AM (see additional assessment for details) - Soreness in the left thumb attributed to underlying arthritis - Able to move the thumb independently despite pain - No evidence of fracture  Physical exam LEFT THUMB (FIRST CMC) INSPECTION: No swelling, erythema, or ecchymosis noted PALPATION: Bony prominence and focal tenderness at the first [facility] joint RANGE OF MOTION: Thumb range of motion preserved STRENGTH: Motor units at the first [facility] fire independently; grip and pinch strength preserved though limited by discomfort NEUROVASCULAR: Sensation intact to light touch throughout the thumb; capillary refill <2 seconds  Primary osteoarthritis of the left first carpometacarpal joint Primary osteoarthritis of the left first carpometacarpal joint with tenderness and pain exacerbated by recent fall. No fractures noted in the thumb or hand. - Instruct to apply Voltaren gel up to four times a day for thumb pain. - Advise to wash hands after application of Voltaren gel.        Orders & Medications Medications:  Meds ordered this encounter  Medications   Vitamin D , Ergocalciferol , (DRISDOL ) 1.25 MG (50000 UNIT) CAPS capsule    Sig: Take 1 capsule (50,000 Units total) by mouth every 7 (seven) days for 8 doses. Take for 8 total doses(weeks)    Dispense:  8 capsule    Refill:  0   No orders of the defined types were  placed in this encounter.    Return in about 2 weeks (around 09/09/2024).     Selinda JINNY Ku, MD, [facility]   Primary Care Sports Medicine Primary Care and Sports Medicine at [facility]

## 2024-08-26 NOTE — Discharge Instructions (Addendum)
 You have a fracture of your left humerus, left thumb is negative for fracture. Wear sling for comfort-call Orthopedics for follow up [facility]: [address] [place], [place] [address] Phone: [phone] Urgent care hours 8a-7:30p Mon-Sat  Ice to area 20 minutes 3 x daily Tylenol  as label directed for pain If you have new or worsening symptoms :Go to Er for further evaluation

## 2024-08-26 NOTE — ED Provider Notes (Signed)
 [facility]    CSN: [id number] Arrival date & time: 08/26/24  1024      History   Chief Complaint Chief Complaint  Patient presents with   Fall    HPI Kristy Sellers is a 83 y.o. female.   83 year old female, Kristy Sellers, presents to urgent care for evaluation of fall.  Patient states approximately 1 hour prior to arrival she fell while walking at the community center after being scared by a roach landed on left shoulder.  Patient is complaining of left shoulder pain,  left thumb pain, left hand dominant.  Patient denies LOC or head strike.   The history is provided by the patient. No language interpreter was used.    Past Medical History:  Diagnosis Date   Detached retina 05/18/2016   Overview:   S/p RRD Repair OD with retained S.O.   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 01/16/2024   Unqualified visual loss of right eye with normal vision of contralateral eye 12/04/2023   Encounter for long-term (current) use of aspirin 10/26/2023   Personal history of fall 10/26/2023   Mild mood disorder (HCC) 10/26/2023   Gastroesophageal reflux disease without esophagitis 05/14/2020   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   Chronic lumbar radiculopathy 10/15/2017   Arthritis of knee 10/15/2017   Primary osteoarthritis of left hip 10/15/2017   Mixed hyperlipidemia 06/11/2016   History of Clostridium difficile colitis 06/11/2016   History of positive PPD 06/11/2016    Pseudophakia 05/18/2016   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   Ocular hypertension 05/18/2016   Cataract 05/18/2016   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   Secondary hypotony of eye 09/27/2014   Essential hypertension 09/27/2014    Past Surgical History:  Procedure Laterality Date   ABDOMINAL HYSTERECTOMY     BOWEL RESECTION  10/01/2023   Procedure: SMALL BOWEL RESECTION;  Surgeon: Marinda Jayson KIDD, MD;  Location: [facility] ORS;  Service: General;;   COLONOSCOPY  ~2006   normal   LAPAROTOMY N/A 10/01/2023   Procedure: EXPLORATORY LAPAROTOMY;  Surgeon: Marinda Jayson KIDD, MD;  Location: [facility] OR[facility];  Service: General;  Laterality: N/A;   LYSIS OF ADHESION  10/01/2023   Procedure: LYSIS OF ADHESION;  Surgeon: Marinda Jayson KIDD, MD;  Location: [facility] OR[facility];  Service: General;;   RETINAL DETACHMENT SURGERY Right     OB History   No obstetric history on file.      Home Medications    Prior to Admission medications   Medication Sig Start Date End Date Taking? Authorizing Provider  amLODipine  (NORVASC ) 5 MG tablet Take 1 tablet (5 mg total) by mouth daily. 05/05/24  Yes Justus Leita DEL, MD  aspirin 81 MG tablet Take 81 mg by mouth at bedtime as needed.   Yes [provider]  atropine  1 % ophthalmic solution  04/07/18  Yes [provider]  docusate sodium  (COLACE) 100 MG capsule Take 1 capsule (100 mg total) by mouth 2 (two) times daily. Hold if  you have diarrhea 10/08/23  Yes Wieting, Richard, MD  polyethylene glycol (MIRALAX  / GLYCOLAX ) 17 g packet Take 17 g by mouth daily. Hold if you have diarrhea 10/08/23  Yes Wieting, Richard, MD  prednisoLONE  acetate (PRED FORTE ) 1 % ophthalmic suspension  03/24/19  Yes [provider]  simvastatin  (ZOCOR ) 10 MG tablet TAKE 1 TABLET BY MOUTH EVERY DAY Patient taking differently: Take 10 mg by mouth at bedtime. 09/07/23  Yes Justus Leita DEL, MD    Family History Family History  Problem Relation Age of Onset   Parkinson's disease Mother    CAD Father     CAD Son        34   Breast cancer Neg Hx     Social History Social History   Tobacco Use   Smoking status: Former    Current packs/day: 0.00    Average packs/day: 0.5 packs/day for 20.0 years (10.0 ttl pk-yrs)    Types: Cigarettes    Start date: 12/30/1963    Quit date: 12/30/1983    Years since quitting: 40.6   Smokeless tobacco: Never  Vaping Use   Vaping status: Never Used  Substance Use Topics   Alcohol use: No    Alcohol/week: 0.0 standard drinks of alcohol   Drug use: Never     Allergies   Patient has no known allergies.   Review of Systems Review of Systems  Constitutional:  Negative for fever.  Musculoskeletal:  Positive for arthralgias and myalgias.  Skin: Negative.   All other systems reviewed and are negative.    Physical Exam Triage Vital Signs ED Triage Vitals  Encounter Vitals Group     BP      Girls Systolic BP Percentile      Girls Diastolic BP Percentile      Boys Systolic BP Percentile      Boys Diastolic BP Percentile      Pulse      Resp      Temp      Temp src      SpO2      Weight      Height      Head Circumference      Peak Flow      Pain Score      Pain Loc      Pain Education      Exclude from Growth Chart    No data found.  Updated Vital Signs BP (!) 153/69 (BP Location: Left Arm)   Pulse 64   Temp 98 F (36.7 C) (Oral)   Wt 162 lb 3.2 oz (73.6 kg)   SpO2 99%   BMI 26.99 kg/m   Visual Acuity Right Eye Distance:   Left Eye Distance:   Bilateral Distance:    Right Eye Near:   Left Eye Near:    Bilateral Near:     Physical Exam Vitals and nursing note reviewed.  Constitutional:      Appearance: Normal appearance. She is well-developed and well-groomed.  HENT:     Head: Normocephalic.  Cardiovascular:     Rate and Rhythm: Normal rate.     Pulses:          Radial pulses are 2+ on the right side and 2+ on the left side.  Pulmonary:     Effort: Pulmonary effort is normal.  Musculoskeletal:     Left  shoulder: Swelling, tenderness and bony tenderness present. No deformity. Decreased range of motion.  Arms:  Neurological:     General: No focal deficit present.     Mental Status: She is alert and oriented to person, place, and time.     GCS: GCS eye subscore is 4. GCS verbal subscore is 5. GCS motor subscore is 6.     Comments: Handgrips equal bilaterally  Psychiatric:        Attention and Perception: Attention normal.        Mood and Affect: Mood normal.        Speech: Speech normal.        Behavior: Behavior normal. Behavior is cooperative.      UC Treatments / Results  Labs (all labs ordered are listed, but only abnormal results are displayed) Labs Reviewed - No data to display  EKG   Radiology DG Finger Thumb Left Result Date: 08/26/2024 CLINICAL DATA:  83 year old female status post fall.  Pain. EXAM: LEFT THUMB 2+V COMPARISON:  None Available. FINDINGS: Three views a left thumb. 1st CMC joint space loss and degeneration is moderate to severe. No superimposed acute fracture or dislocation is identified. IMPRESSION: 1. No acute fracture or dislocation identified about the left thumb. 2. Moderate to severe 1st CMC joint degeneration. Electronically Signed   By: VEAR Hurst M.D.   On: 08/26/2024 12:08   DG Shoulder Left Result Date: 08/26/2024 CLINICAL DATA:  83 year old female status post fall.  Pain. EXAM: LEFT SHOULDER - 2+ VIEW COMPARISON:  None Available. FINDINGS: Four views 1123 hours. Minimally displaced proximal left humerus fracture, appears mildly comminuted and is most visible along the lateral neck of the humerus. Glenohumeral alignment appears maintained. Visible left clavicle and scapula appear intact. Visible left ribs and chest appear negative. IMPRESSION: Minimally displaced proximal left humerus fracture at the neck. No glenohumeral dislocation. Electronically Signed   By: VEAR Hurst M.D.   On: 08/26/2024 12:07    Procedures Procedures (including critical care  time)  Medications Ordered in UC Medications - No data to display  Initial Impression / Assessment and Plan / UC Course  I have reviewed the triage vital signs and the nursing notes.  Pertinent labs & imaging results that were available during my care of the patient were reviewed by me and considered in my medical decision making (see chart for details).  Clinical Course as of 08/26/24 1351  Fri Aug 26, 2024  1200 Wet read left humeral head fracture by this provider. [JD]  1213 Discussed xray results and plan of care with pt, will apply sling, follow up with Orthopedics-call for appt. Tylenol  as label directed for pain. [JD]    Clinical Course User Index [JD] Deb Loudin, Rilla, NP   Discussed exam findings and plan of care with patient, referred to Orthopedics, left arm sling applied by staff, strict go to ER precautions given.   Patient verbalized understanding to this provider.  Ddx: Left humeral head fracture, left thumb pain, sprain, dislocation, fall Final Clinical Impressions(s) / UC Diagnoses   Final diagnoses:  Fall, initial encounter  Humeral head fracture, left, closed, initial encounter  Pain of left thumb     Discharge Instructions      You have a fracture of your left humerus, left thumb is negative for fracture. Wear sling for comfort-call Orthopedics for follow up [facility]: [address] [place], [place] [address] Phone: [phone] Urgent care hours 8a-7:30p Mon-Sat  Ice to area 20 minutes 3 x daily Tylenol  as label directed for pain If you have new or worsening  symptoms :Go to Er for further evaluation     ED Prescriptions   None    PDMP not reviewed this encounter.   Aminta Loose, NP 08/26/24 1352

## 2024-08-26 NOTE — ED Triage Notes (Signed)
 Pt c/o fall  Pt states that [place] the center and was [place] by a roach. Pt fell and landed on her left shoulder and hand  Pt has pain in her shoulder and her hand along her thumb.   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   Pt is unable to lift her arm

## 2024-08-26 NOTE — Telephone Encounter (Signed)
 Copied from CRM #[id number]. Topic: Clinical - Red Word Triage >> Aug 26, 2024  3:57 PM Avram MATSU wrote: Red Word that prompted transfer to Nurse Triage: weakness on right side, patient is not able to use her body with assistance.causing her pain from helping. She has no strength on right side. Answer Assessment - Initial Assessment Questions Patient's daughter, Gretel, called regarding order for rehabilitation and or home health.  Pt seen today in urgent care and in clinic for fracture of left shoulder.  Left side is patient's dominant side and advised not to use left arm. Patient has problems with knees and right side is not as strong.  Patient's daughter reports cannot safely go to restroom or ADLs by herself. She cannot balance herself or get up and down off the toilet, even when we were helping her; just realized it was an issue.  Protocols used: Information Only Call - No Triage-A-AH

## 2024-08-30 ENCOUNTER — Ambulatory Visit
Admission: RE | Admit: 2024-08-30 | Discharge: 2024-08-30 | Disposition: A | Discharge status: Home / Self Care | Source: Ambulatory Visit | Attending: Family Medicine | Admitting: Family Medicine

## 2024-08-30 ENCOUNTER — Encounter: Payer: Self-pay | Admitting: Family Medicine

## 2024-08-30 ENCOUNTER — Ambulatory Visit
Admission: RE | Admit: 2024-08-30 | Discharge: 2024-08-30 | Disposition: A | Discharge status: Home / Self Care | Attending: Family Medicine | Admitting: Family Medicine

## 2024-08-30 ENCOUNTER — Ambulatory Visit: Admitting: Family Medicine

## 2024-08-30 ENCOUNTER — Ambulatory Visit: Payer: Self-pay

## 2024-08-30 VITALS — BP 140/70 | HR 65 | Ht 65.0 in | Wt 161.0 lb

## 2024-08-30 DIAGNOSIS — G8929 Other chronic pain: Secondary | ICD-10-CM

## 2024-08-30 DIAGNOSIS — M25561 Pain in right knee: Secondary | ICD-10-CM | POA: Diagnosis not present

## 2024-08-30 DIAGNOSIS — M25562 Pain in left knee: Secondary | ICD-10-CM | POA: Diagnosis not present

## 2024-08-30 DIAGNOSIS — S42292A Other displaced fracture of upper end of left humerus, initial encounter for closed fracture: Secondary | ICD-10-CM

## 2024-08-30 DIAGNOSIS — M1812 Unilateral primary osteoarthritis of first carpometacarpal joint, left hand: Secondary | ICD-10-CM | POA: Diagnosis not present

## 2024-08-30 NOTE — Patient Instructions (Signed)
 Patient Plan for Post-Visit Guidance  Left Proximal Humerus Fracture  - Expect bruising to change color and possibly spread as healing continues. - No new injuries reported; continue to monitor for changes.  Knee Osteoarthritis with Fall Risk  - Get x-rays of both knees as ordered. - Home health evaluation will be arranged to help with mobility and fall prevention. - Review the educational printout on knee osteoarthritis management options.  Left Thumb Osteoarthritis  - Continue using Voltaren gel up to four times daily. - Consider using a thumb brace if pain increases. - Cortisone injection is available if thumb pain becomes intolerable.  Red Flags  - Seek medical attention if you experience severe pain, sudden swelling, inability to move a joint, new numbness or weakness, falls with injury, or any new or worsening symptoms.

## 2024-08-30 NOTE — Telephone Encounter (Signed)
 Noted  Pt has a appt.  KP

## 2024-08-30 NOTE — Telephone Encounter (Signed)
 Please review and placed home health order.  JM

## 2024-08-30 NOTE — Telephone Encounter (Signed)
 FYI Only or Action Required?: FYI only for provider.  Patient was last seen in primary care on 08/26/2024 by Alvia Selinda PARAS, MD.  Called Nurse Triage reporting arm bleeding .  Symptoms began yesterday.  Interventions attempted: Nothing.  Symptoms are: gradually worsening.  Triage Disposition: No disposition on file.  Patient/caregiver understands and will follow disposition?:          Copied from CRM [id number]. Topic: Clinical - Red Word Triage >> Aug 30, 2024 10:04 AM Rolland B wrote: Red Word that prompted transfer to Nurse Triage: Patient experienced a fall on Friday, 8/29 and has since been to urgent care and seen a doctor. It was recommended for her to come back in 2 weeks to follow up but today has woken up with her arm black, blue, and purple as the daughter describes it. She states that it is bleeding as well. Reason for Disposition  Large swelling or bruise (> 2 inches or 5 cm)  Answer Assessment - Initial Assessment Questions 1. MECHANISM: How did the injury happen?     fall 2. ONSET: When did the injury happen? (e.g., minutes, hours ago)      08/26/24 3. LOCATION: Where is the injury located? Which arm?     Left  4. APPEARANCE of INJURY: What does the injury look like?      *No Answer* 5. SEVERITY: Can you use the arm normally?      *No Answer* 6. SWELLING or BRUISING: is there any swelling or bruising? If Yes, ask: How large is it? (e.g., inches, centimeters)      Bruising to anterior upper arms  7. PAIN: Is there pain? If Yes, ask: How bad is the pain? (Scale 0-10; or none, mild, moderate, severe)     No pain  8. TETANUS: For any breaks in the skin, ask: When was your last tetanus booster?     N/sa 9. OTHER SYMPTOMS: Do you have any other symptoms?  (e.g., numbness in hand)     Pain to thumb area 10. PREGNANCY: Is there any chance you are pregnant? When was your last menstrual period?       N/a  Protocols used: Arm  Injury-A-AH

## 2024-08-31 NOTE — Assessment & Plan Note (Signed)
>>  ASSESSMENT AND PLAN FOR CHRONIC PAIN OF BOTH KNEES WRITTEN ON 08/31/2024  5:01 PM BY Cassell Voorhies J, MD  Knee pain and functional impairment - Intermittent knee problems, sometimes denied - Difficulty rising from chairs and getting off the toilet, exacerbated by limited use of dominant hand - Handicap sticker in use due to knee issues - No imaging available for the knees  Osteoarthritis of knees with functional impairment and risk of falls Osteoarthritis is causing functional impairment and increasing fall risk. She struggles with mobility, particularly getting out of chairs, due to compromised dominant hand. Knee swelling affects her daily life, necessitating intervention to prevent falls and improve function. Discussed treatments include cortisone injections, gel injections, nerve ablation, and Cymbalta. Gel injections may provide six months of relief but require x-ray evidence for insurance. Surgery is a last resort due to age and procedure magnitude. - Order x-rays of the knees to assess degree of arthritis. - Order home health evaluation for mobility assistance and fall prevention.

## 2024-08-31 NOTE — Assessment & Plan Note (Signed)
 Knee pain and functional impairment - Intermittent knee problems, sometimes denied - Difficulty rising from chairs and getting off the toilet, exacerbated by limited use of dominant hand - Handicap sticker in use due to knee issues - No imaging available for the knees  Osteoarthritis of knees with functional impairment and risk of falls Osteoarthritis is causing functional impairment and increasing fall risk. She struggles with mobility, particularly getting out of chairs, due to compromised dominant hand. Knee swelling affects her daily life, necessitating intervention to prevent falls and improve function. Discussed treatments include cortisone injections, gel injections, nerve ablation, and Cymbalta. Gel injections may provide six months of relief but require x-ray evidence for insurance. Surgery is a last resort due to age and procedure magnitude. - Order x-rays of the knees to assess degree of arthritis. - Order home health evaluation for mobility assistance and fall prevention.

## 2024-08-31 NOTE — Assessment & Plan Note (Addendum)
 History of Present Illness Kristy Sellers is an 83 year old female with arthritis who presents with concerns about bruising and joint pain. She is accompanied by her daughter, who is also her power of attorney.  Ecchymosis of the arm - Bruising appeared on the arm four days ago without any new injuries or trauma - Ecchymosis has changed colors and extended down the arm - Bruising took several days to appear Impaired activities of daily living and fall risk - Lives alone, complicating independent function due to current physical limitations - Daughter is seeking home health assistance to manage daily activities and prevent falls  Physical Exam RIGHT SHOULDER INSPECTION: No swelling, erythema, ecchymosis (with anterior upper arm showing resolving ecchymosis), atrophy, deformity, or skin changes PALPATION: Minimal discomfort at the lateral shoulder; anterior upper arm nontender; no crepitus, effusion, warmth, nodules, or bony abnormalities appreciated RANGE OF MOTION: Grossly full and symmetric in flexion, abduction, external and internal rotation; mild discomfort at terminal ranges only STRENGTH: 5/5 strength in external and internal rotation and with isolated supraspinatus testing, symmetric NEUROVASCULAR: Sensation intact to light touch in upper extremity dermatomes; distal pulses palpable  Fracture of left proximal humerus The fracture is associated with resolving ecchymosis on the anterior upper arm. The area is non-tender. Bruising is expected to change color and may extend further as the injury resolves. No new injuries reported. - Educate on the normal progression of bruising and healing.

## 2024-08-31 NOTE — Progress Notes (Signed)
 Primary Care / Sports Medicine Office Visit  Patient Information:  Patient ID: Kristy Sellers, female DOB: 11-16-1941 Age: 83 y.o. MRN: [id number]   Kristy Sellers is a pleasant 83 y.o. female presenting with the following:  Chief Complaint  Patient presents with   Shoulder Injury    Patient presents today because bruising occurred 4 days later after her fall.     Vitals:   08/30/24 1106  BP: (!) 140/70  Pulse: 65  SpO2: 97%   Vitals:   08/30/24 1106  Weight: 161 lb (73 kg)  Height: 5' 5 (1.651 m)   Body mass index is 26.79 kg/m.  DG Finger Thumb Left Result Date: 08/26/2024 CLINICAL DATA:  83 year old female status post fall.  Pain. EXAM: LEFT THUMB 2+V COMPARISON:  None Available. FINDINGS: Three views a left thumb. 1st CMC joint space loss and degeneration is moderate to severe. No superimposed acute fracture or dislocation is identified. IMPRESSION: 1. No acute fracture or dislocation identified about the left thumb. 2. Moderate to severe 1st CMC joint degeneration. Electronically Signed   By: VEAR Hurst M.D.   On: 08/26/2024 12:08   DG Shoulder Left Result Date: 08/26/2024 CLINICAL DATA:  83 year old female status post fall.  Pain. EXAM: LEFT SHOULDER - 2+ VIEW COMPARISON:  None Available. FINDINGS: Four views 1123 hours. Minimally displaced proximal left humerus fracture, appears mildly comminuted and is most visible along the lateral neck of the humerus. Glenohumeral alignment appears maintained. Visible left clavicle and scapula appear intact. Visible left ribs and chest appear negative. IMPRESSION: Minimally displaced proximal left humerus fracture at the neck. No glenohumeral dislocation. Electronically Signed   By: VEAR Hurst M.D.   On: 08/26/2024 12:07     Independent interpretation of notes and tests performed by another provider:   None  Procedures performed:   None  Pertinent History, Exam, Impression, and Recommendations:   Problem List Items Addressed This  Visit     Chronic pain of both knees   Knee pain and functional impairment - Intermittent knee problems, sometimes denied - Difficulty rising from chairs and getting off the toilet, exacerbated by limited use of dominant hand - Handicap sticker in use due to knee issues - No imaging available for the knees  Osteoarthritis of knees with functional impairment and risk of falls Osteoarthritis is causing functional impairment and increasing fall risk. She struggles with mobility, particularly getting out of chairs, due to compromised dominant hand. Knee swelling affects her daily life, necessitating intervention to prevent falls and improve function. Discussed treatments include cortisone injections, gel injections, nerve ablation, and Cymbalta. Gel injections may provide six months of relief but require x-ray evidence for insurance. Surgery is a last resort due to age and procedure magnitude. - Order x-rays of the knees to assess degree of arthritis. - Order home health evaluation for mobility assistance and fall prevention.      Relevant Orders   DG Knee Complete 4 Views Left   DG Knee Complete 4 Views Right   Ambulatory referral to Home Health   Humeral head fracture, left, closed, initial encounter   History of Present Illness Kristy Sellers is an 83 year old female with arthritis who presents with concerns about bruising and joint pain. She is accompanied by her daughter, who is also her power of attorney.  Ecchymosis of the arm - Bruising appeared on the arm four days ago without any new injuries or trauma - Ecchymosis has changed colors and extended down the arm -  Bruising took several days to appear Impaired activities of daily living and fall risk - Lives alone, complicating independent function due to current physical limitations - Daughter is seeking home health assistance to manage daily activities and prevent falls  Physical Exam RIGHT SHOULDER INSPECTION: No swelling,  erythema, ecchymosis (with anterior upper arm showing resolving ecchymosis), atrophy, deformity, or skin changes PALPATION: Minimal discomfort at the lateral shoulder; anterior upper arm nontender; no crepitus, effusion, warmth, nodules, or bony abnormalities appreciated RANGE OF MOTION: Grossly full and symmetric in flexion, abduction, external and internal rotation; mild discomfort at terminal ranges only STRENGTH: 5/5 strength in external and internal rotation and with isolated supraspinatus testing, symmetric NEUROVASCULAR: Sensation intact to light touch in upper extremity dermatomes; distal pulses palpable  Fracture of left proximal humerus The fracture is associated with resolving ecchymosis on the anterior upper arm. The area is non-tender. Bruising is expected to change color and may extend further as the injury resolves. No new injuries reported. - Educate on the normal progression of bruising and healing.      Relevant Orders   Ambulatory referral to Home Health   Primary osteoarthritis of first carpometacarpal joint of left hand - Primary   Relevant Orders   Ambulatory referral to Home Health     Orders & Medications Medications: No orders of the defined types were placed in this encounter.  Orders Placed This Encounter  Procedures   DG Knee Complete 4 Views Left   DG Knee Complete 4 Views Right   Ambulatory referral to Home Health     No follow-ups on file.     Selinda JINNY Ku, MD, [facility]   Primary Care Sports Medicine Primary Care and Sports Medicine at [facility]

## 2024-09-02 ENCOUNTER — Telehealth: Payer: Self-pay

## 2024-09-02 NOTE — Telephone Encounter (Signed)
 Spoke with [name] and informed her the order is pending. Sent message to Karna to address daughter Tarins concerns.   JM   Copied from CRM [id number]. Topic: Clinical - Home Health Verbal Orders >> Sep 02, 2024  3:41 PM [name] wrote: Kristy Sellers [facility] [phone] is calling to follow up on the Home Health order that was placed 08/31/24. Would like to know what company will be calling to scheduled home health. Please advise

## 2024-09-05 ENCOUNTER — Telehealth: Payer: Self-pay | Admitting: Family Medicine

## 2024-09-05 ENCOUNTER — Encounter: Payer: Self-pay | Admitting: Family Medicine

## 2024-09-05 ENCOUNTER — Ambulatory Visit: Admitting: Family Medicine

## 2024-09-05 VITALS — BP 112/56 | HR 71 | Ht 65.0 in | Wt 161.0 lb

## 2024-09-05 DIAGNOSIS — S42292D Other displaced fracture of upper end of left humerus, subsequent encounter for fracture with routine healing: Secondary | ICD-10-CM

## 2024-09-05 DIAGNOSIS — M170 Bilateral primary osteoarthritis of knee: Secondary | ICD-10-CM

## 2024-09-05 DIAGNOSIS — M1812 Unilateral primary osteoarthritis of first carpometacarpal joint, left hand: Secondary | ICD-10-CM | POA: Diagnosis not present

## 2024-09-05 NOTE — Assessment & Plan Note (Addendum)
 Knee arthralgia and instability - Chronic condition - Difficulty with stairs and occasional knee instability - Knee symptoms include clicking and popping - Knee swelling when sitting in certain car positions, especially in daughter's car requiring more bending and twisting  Bilateral knee osteoarthritis Severe bilateral patellofemoral osteoarthritis, additionally intermittently causing instability, difficulty with stairs, swelling, and discomfort. [employer] insurance authorization for gel injections for both knees. - Refer to physical therapy for knee stability and strength once symptoms allow.

## 2024-09-05 NOTE — Patient Instructions (Signed)
 Bilateral Knee Osteoarthritis  - Await insurance authorization for gel injections for both knees. - Begin physical therapy for knee stability and strength when symptoms allow. We can coordinate referral after gel injections. - Monitor for increased knee pain, swelling, or instability, especially with stairs or certain sitting positions.  Left Proximal Humerus Fracture and Hand Swelling  - Adjust sling for proper support to reduce swelling. - Elevate your arm above heart level as much as possible. - Take breaks from the sling at home, keeping your arm supported and elevated. - Use topical pain medication up to four times daily as needed. - Schedule and attend follow-up x-rays before your next appointment. - Avoid excessive movement of the injured arm to support healing.  Left Thumb Osteoarthritis  - Monitor for increased thumb pain, especially when using the thumb strap in your sling. - Adjust or stop using the thumb strap if it causes pain.  When to Seek Immediate Medical Attention  - Sudden severe pain, swelling, or inability to move the knee, arm, or thumb - New numbness, tingling, or color changes in the hand or fingers - Signs of infection such as redness, warmth, or fever in any affected area  Continue to monitor your symptoms and follow up as directed.

## 2024-09-05 NOTE — Progress Notes (Signed)
 Primary Care / Sports Medicine Office Visit  Patient Information:  Patient ID: Kristy Sellers, female DOB: August 31, 1941 Age: 83 y.o. MRN: [id number]   Kristy Sellers is a pleasant 83 y.o. female presenting with the following:  Chief Complaint  Patient presents with   hand swelling    Patient was bumped on Saturday while out and left hand has started to swell. She has been using ice to keep swelling down.     Vitals:   09/05/24 1050  BP: (!) 112/56  Pulse: 71  SpO2: 96%   Vitals:   09/05/24 1050  Weight: 161 lb (73 kg)  Height: 5' 5 (1.651 m)   Body mass index is 26.79 kg/m.   Discussed the use of AI scribe software for clinical note transcription with the patient, who gave verbal consent to proceed.   Independent interpretation of notes and tests performed by another provider:   Independent interpretation of bilateral knee radiographs (08/30/2024) demonstrate lateral compartment joint space narrowing (left greater than right) without full loss of joint space, additional medial compartment narrowing on the left, and bilateral lateral patellar lateral positioning on AP, severe patellofemoral degenerative changes, no acute osseous processes.  Procedures performed:   None  Pertinent History, Exam, Impression, and Recommendations:   Problem List Items Addressed This Visit     Bilateral primary osteoarthritis of knee   Knee arthralgia and instability - Chronic condition - Difficulty with stairs and occasional knee instability - Knee symptoms include clicking and popping - Knee swelling when sitting in certain car positions, especially in daughter's car requiring more bending and twisting  Bilateral knee osteoarthritis Severe bilateral patellofemoral osteoarthritis, additionally intermittently causing instability, difficulty with stairs, swelling, and discomfort. [employer] insurance authorization for gel injections for both knees. - Refer to physical therapy for knee  stability and strength once symptoms allow.      Humeral head fracture, left, closed, initial encounter - Primary   Upper extremity swelling and pain - Hand swelling and shoulder pain following recent arm fracture - Swelling was more pronounced yesterday, now decreased - Subtle change in skin hue of the hand - Swelling attributed to improper sling positioning - Removed ring on Friday due to swelling - Believes swelling is related to blood flow issues from arm immobility  Shoulder and elbow pain - Shoulder pain with certain arm movements, especially without sling - No increase in shoulder pain after bumping elbow at supermarket on Saturday - No pain with some shoulder movements - Left-handed, using right hand more frequently and gaining strength  Functional status and activity - Determined to remain active and perform daily tasks despite injury - Daughter advises against activity to prevent further injury  Physical Exam LEFT HAND / UE INSPECTION: Subtle different hue to the skin of the hand. Radial pulse intact. Capillary refill brisk. PALPATION: No tenderness to palpation at the shoulder. RANGE OF MOTION: Able to move hand and fingers.  Left proximal humerus fracture with secondary left hand swelling Swelling due to immobilization and reduced movement. Critical healing period for fracture; excessive movement risks improper healing or nonunion. - Adjust sling for proper support. - Encourage arm elevation above heart to reduce swelling. - Allow time out of sling at home with support and elevation. - Use topical pain medication up to four times daily as needed. - Schedule follow-up x-rays before next appointment.       Primary osteoarthritis of first carpometacarpal joint of left hand   Left thumb osteoarthritis Potential  exacerbation from thumb strap in sling. - Monitor for increased thumb pain from strap. - Adjust or discontinue thumb strap if pain occurs.        Orders  & Medications Medications: No orders of the defined types were placed in this encounter.  No orders of the defined types were placed in this encounter.    No follow-ups on file.     Selinda JINNY Ku, MD, [facility]   Primary Care Sports Medicine Primary Care and Sports Medicine at [facility]

## 2024-09-05 NOTE — Assessment & Plan Note (Signed)
 Left thumb osteoarthritis Potential exacerbation from thumb strap in sling. - Monitor for increased thumb pain from strap. - Adjust or discontinue thumb strap if pain occurs.

## 2024-09-05 NOTE — Assessment & Plan Note (Addendum)
 Upper extremity swelling and pain - Hand swelling and shoulder pain following recent arm fracture - Swelling was more pronounced yesterday, now decreased - Subtle change in skin hue of the hand - Swelling attributed to improper sling positioning - Removed ring on Friday due to swelling - Believes swelling is related to blood flow issues from arm immobility  Shoulder and elbow pain - Shoulder pain with certain arm movements, especially without sling - No increase in shoulder pain after bumping elbow at supermarket on Saturday - No pain with some shoulder movements - Left-handed, using right hand more frequently and gaining strength  Functional status and activity - Determined to remain active and perform daily tasks despite injury - Daughter advises against activity to prevent further injury  Physical Exam LEFT HAND / UE INSPECTION: Subtle different hue to the skin of the hand. Radial pulse intact. Capillary refill brisk. PALPATION: No tenderness to palpation at the shoulder. RANGE OF MOTION: Able to move hand and fingers.  Left proximal humerus fracture with secondary left hand swelling Swelling due to immobilization and reduced movement. Critical healing period for fracture; excessive movement risks improper healing or nonunion. - Adjust sling for proper support. - Encourage arm elevation above heart to reduce swelling. - Allow time out of sling at home with support and elevation. - Use topical pain medication up to four times daily as needed. - Schedule follow-up x-rays before next appointment.

## 2024-09-07 NOTE — Telephone Encounter (Signed)
 Completed.

## 2024-09-08 ENCOUNTER — Ambulatory Visit
Admission: RE | Admit: 2024-09-08 | Discharge: 2024-09-08 | Disposition: A | Discharge status: Home / Self Care | Attending: Family Medicine | Admitting: Family Medicine

## 2024-09-08 ENCOUNTER — Ambulatory Visit (INDEPENDENT_AMBULATORY_CARE_PROVIDER_SITE_OTHER): Admitting: Family Medicine

## 2024-09-08 ENCOUNTER — Ambulatory Visit
Admission: RE | Admit: 2024-09-08 | Discharge: 2024-09-08 | Disposition: A | Discharge status: Home / Self Care | Source: Ambulatory Visit | Attending: Family Medicine | Admitting: Family Medicine

## 2024-09-08 ENCOUNTER — Encounter: Payer: Self-pay | Admitting: Family Medicine

## 2024-09-08 ENCOUNTER — Telehealth: Payer: Self-pay

## 2024-09-08 VITALS — BP 128/60 | HR 65 | Ht 65.0 in | Wt 161.0 lb

## 2024-09-08 DIAGNOSIS — S42292A Other displaced fracture of upper end of left humerus, initial encounter for closed fracture: Secondary | ICD-10-CM

## 2024-09-08 DIAGNOSIS — S42292D Other displaced fracture of upper end of left humerus, subsequent encounter for fracture with routine healing: Secondary | ICD-10-CM

## 2024-09-08 NOTE — Telephone Encounter (Signed)
 Copied from CRM #[id number]. Topic: Clinical - Home Health Verbal Orders >> Sep 08, 2024  9:47 AM DeAngela L wrote: Caller/Agency: Angela calling with [facility]  Callback Number: [phone] secured line  Service Requested: Skilled Nursing Frequency: 1w2 every other w4 Any new concerns about the patient? No

## 2024-09-08 NOTE — Telephone Encounter (Signed)
 FYI Spoke with [name] and gave verbal orders for Kristy Sellers to get skilled nursing 1 a week x 2 weeks and then every other week x 4 weeks.

## 2024-09-09 ENCOUNTER — Ambulatory Visit: Admitting: Family Medicine

## 2024-09-09 ENCOUNTER — Telehealth: Payer: Self-pay | Admitting: Internal Medicine

## 2024-09-09 NOTE — Telephone Encounter (Signed)
 Noted  KP

## 2024-09-09 NOTE — Telephone Encounter (Signed)
 Copied from CRM #[id number]. Topic: General - Other >> Sep 09, 2024 12:03 PM Turkey B wrote: Reason for CRM: Phu from [place] called in says patient couldn't do Physical therapy today she rescheduled for 09/15

## 2024-09-12 ENCOUNTER — Telehealth: Payer: Self-pay

## 2024-09-12 NOTE — Telephone Encounter (Signed)
 Copied from CRM [id number]. Topic: Clinical - Home Health Verbal Orders >> Sep 12, 2024  2:16 PM Corin V wrote: Caller/Agency: Phu, physical therapy- [facility] Callback Number: [phone], okay to leave voicemail Service Requested: Physical Therapy, Occupational Therapy Frequency: 1 week 2 for physical therapy orders, and he requesting occupational therapy evaluation order Any new concerns about the patient? No

## 2024-09-12 NOTE — Telephone Encounter (Signed)
Called [place] gave verbal orders. He verbalized understanding.  KP

## 2024-09-14 NOTE — Assessment & Plan Note (Signed)
 History of Present Illness Taina Landry is an 83 year old female who presents for follow-up on her left shoulder fracture.  Left shoulder fracture and range of motion limitation - Sustained a left shoulder fracture approximately two weeks ago - Stiffness in the left shoulder, especially with abduction - Active abduction limited to approximately 30 degrees - Passive abduction limited to an additional 20 degrees - Forward flexion limited to about 5 degrees from neutral - Extension is unrestricted - No significant pain at the fracture site  Pain management and adjunctive therapies - Uses Voltaren gel and over-the-counter lidocaine  patches as needed for pain - Applies heat to relax muscles prior to exercises  Supplement use - Takes vitamin D  and calcium supplements once a week  Physical Exam PALPATION: Left shoulder non-tender to palpation and percussion. No crepitus, effusion, warmth, nodules, or bony abnormalities. RANGE OF MOTION: Active abduction of left shoulder to 30 degrees, limited by stiffness. Passive abduction to additional 20 degrees. Forward flexion limited to 5 degrees from neutral. Extension unrestricted. STRENGTH: Able to independently fire all motor units at left shoulder.  Results RADIOLOGY Shoulder X-ray: Stable alignment with subtle changes indicating remodeling. No progression of fracture lucency. (09/08/2024)  Assessment and Plan Healing left proximal humerus fracture Fracture healing with remodeling on x-ray, stable and non-tender. - Transition out of sling at home, reduce use as tolerated. - Use sling outside until comfortable without it at home. - Encourage gentle use of left arm for daily activities. - Continue vitamin D  and calcium supplementation. - Use Voltaren gel for pain as needed. - Consider OTC lidocaine  patches for pain relief. - Apply heat to shoulder before exercises.  Left shoulder stiffness and limited range of motion Stiffness and limited  ROM due to immobilization, expected to improve with therapy. - Initiate physical therapy twice a week. - Encourage gentle home exercises for mobility. - Coordinate with home health for PT services. - Monitor for persistent pain not relieved by rest or positioning.  Vitamin D  deficiency On vitamin D  supplementation. - Continue vitamin D  supplementation as prescribed.

## 2024-09-14 NOTE — Progress Notes (Signed)
 Primary Care / Sports Medicine Office Visit  Patient Information:  Patient ID: Kristy Sellers, female DOB: November 25, 1941 Age: 83 y.o. MRN: [id number]   Kristy Sellers is a pleasant 83 y.o. female presenting with the following:  Chief Complaint  Patient presents with   Shoulder Injury    Patient presents for a follow up on her left shoulder . It is feeling better, she continues to wear sling for shoulder. She has taken xray's today to see if her shoulder fx is healing properly.    Vitals:   09/08/24 1017  BP: 128/60  Pulse: 65  SpO2: 92%   Vitals:   09/08/24 1017  Weight: 161 lb (73 kg)  Height: 5' 5 (1.651 m)   Body mass index is 26.79 kg/m.     Discussed the use of AI scribe software for clinical note transcription with the patient, who gave verbal consent to proceed.   Independent interpretation of notes and tests performed by another provider:   None  Procedures performed:   None  Pertinent History, Exam, Impression, and Recommendations:   Problem List Items Addressed This Visit     Closed fracture of head of left humerus - Primary   History of Present Illness Kristy Sellers is an 83 year old female who presents for follow-up on her left shoulder fracture.  Left shoulder fracture and range of motion limitation - Sustained a left shoulder fracture approximately two weeks ago - Stiffness in the left shoulder, especially with abduction - Active abduction limited to approximately 30 degrees - Passive abduction limited to an additional 20 degrees - Forward flexion limited to about 5 degrees from neutral - Extension is unrestricted - No significant pain at the fracture site  Pain management and adjunctive therapies - Uses Voltaren gel and over-the-counter lidocaine  patches as needed for pain - Applies heat to relax muscles prior to exercises  Supplement use - Takes vitamin D  and calcium supplements once a week  Physical Exam PALPATION: Left shoulder  non-tender to palpation and percussion. No crepitus, effusion, warmth, nodules, or bony abnormalities. RANGE OF MOTION: Active abduction of left shoulder to 30 degrees, limited by stiffness. Passive abduction to additional 20 degrees. Forward flexion limited to 5 degrees from neutral. Extension unrestricted. STRENGTH: Able to independently fire all motor units at left shoulder.  Results RADIOLOGY Shoulder X-ray: Stable alignment with subtle changes indicating remodeling. No progression of fracture lucency. (09/08/2024)  Assessment and Plan Healing left proximal humerus fracture Fracture healing with remodeling on x-ray, stable and non-tender. - Transition out of sling at home, reduce use as tolerated. - Use sling outside until comfortable without it at home. - Encourage gentle use of left arm for daily activities. - Continue vitamin D  and calcium supplementation. - Use Voltaren gel for pain as needed. - Consider OTC lidocaine  patches for pain relief. - Apply heat to shoulder before exercises.  Left shoulder stiffness and limited range of motion Stiffness and limited ROM due to immobilization, expected to improve with therapy. - Initiate physical therapy twice a week. - Encourage gentle home exercises for mobility. - Coordinate with home health for PT services. - Monitor for persistent pain not relieved by rest or positioning.  Vitamin D  deficiency On vitamin D  supplementation. - Continue vitamin D  supplementation as prescribed.      Relevant Orders   Ambulatory referral to Home Health     Orders & Medications Medications: No orders of the defined types were placed in this encounter.  Orders Placed This Encounter  Procedures   Ambulatory referral to Home Health     Return in about 4 weeks (around 10/06/2024).     Selinda JINNY Ku, MD, [facility]   Primary Care Sports Medicine Primary Care and Sports Medicine at [facility]

## 2024-09-14 NOTE — Patient Instructions (Signed)
 Patient Plan  Healing Left Proximal Humerus Fracture  - Begin transitioning out of the sling at home; reduce use as tolerated. - Continue using the sling outside until you are comfortable without it at home. - Gently use your left arm for daily activities as able. - Continue taking vitamin D  and calcium supplements as prescribed. - Use Voltaren gel for pain as needed. - Consider over-the-counter lidocaine  patches for additional pain relief. - Apply heat to the shoulder before doing exercises.  Left Shoulder Stiffness and Limited Range of Motion  - Start physical therapy twice a week. - Do gentle home exercises to improve mobility. - Home health will help coordinate physical therapy services. - Monitor for pain that does not improve with rest or changing positions.  Vitamin D  Deficiency  - Continue vitamin D  supplementation as prescribed.  Red flags - seek care if you notice:  - Sudden increase in pain, swelling, or redness in the shoulder - Numbness, tingling, or weakness in the arm or hand - Inability to move the arm - New or worsening symptoms that concern you

## 2024-09-30 ENCOUNTER — Emergency Department

## 2024-09-30 ENCOUNTER — Ambulatory Visit: Payer: Self-pay

## 2024-09-30 ENCOUNTER — Other Ambulatory Visit: Payer: Self-pay

## 2024-09-30 ENCOUNTER — Emergency Department
Admission: EM | Admit: 2024-09-30 | Discharge: 2024-09-30 | Disposition: A | Discharge status: Home / Self Care | Attending: Emergency Medicine | Admitting: Emergency Medicine

## 2024-09-30 DIAGNOSIS — W01198A Fall on same level from slipping, tripping and stumbling with subsequent striking against other object, initial encounter: Secondary | ICD-10-CM | POA: Insufficient documentation

## 2024-09-30 DIAGNOSIS — S0990XA Unspecified injury of head, initial encounter: Secondary | ICD-10-CM

## 2024-09-30 DIAGNOSIS — R55 Syncope and collapse: Secondary | ICD-10-CM | POA: Diagnosis not present

## 2024-09-30 DIAGNOSIS — S0101XA Laceration without foreign body of scalp, initial encounter: Secondary | ICD-10-CM | POA: Insufficient documentation

## 2024-09-30 DIAGNOSIS — W19XXXA Unspecified fall, initial encounter: Secondary | ICD-10-CM

## 2024-09-30 DIAGNOSIS — M25512 Pain in left shoulder: Secondary | ICD-10-CM | POA: Insufficient documentation

## 2024-09-30 DIAGNOSIS — Y92002 Bathroom of unspecified non-institutional (private) residence single-family (private) house as the place of occurrence of the external cause: Secondary | ICD-10-CM | POA: Insufficient documentation

## 2024-09-30 LAB — CBC WITH DIFFERENTIAL/PLATELET
Abs Immature Granulocytes: 0.07 K/uL (ref 0.00–0.07)
Basophils Absolute: 0 K/uL (ref 0.0–0.1)
Basophils Relative: 0 %
Eosinophils Absolute: 0.2 K/uL (ref 0.0–0.5)
Eosinophils Relative: 1 %
HCT: 38.4 % (ref 36.0–46.0)
Hemoglobin: 12 g/dL (ref 12.0–15.0)
Immature Granulocytes: 1 %
Lymphocytes Relative: 13 %
Lymphs Abs: 1.7 K/uL (ref 0.7–4.0)
MCH: 25.6 pg — ABNORMAL LOW (ref 26.0–34.0)
MCHC: 31.3 g/dL (ref 30.0–36.0)
MCV: 82.1 fL (ref 80.0–100.0)
Monocytes Absolute: 0.9 K/uL (ref 0.1–1.0)
Monocytes Relative: 7 %
Neutro Abs: 10.4 K/uL — ABNORMAL HIGH (ref 1.7–7.7)
Neutrophils Relative %: 78 %
Platelets: 260 K/uL (ref 150–400)
RBC: 4.68 MIL/uL (ref 3.87–5.11)
RDW: 16.3 % — ABNORMAL HIGH (ref 11.5–15.5)
WBC: 13.2 K/uL — ABNORMAL HIGH (ref 4.0–10.5)
nRBC: 0 % (ref 0.0–0.2)

## 2024-09-30 LAB — COMPREHENSIVE METABOLIC PANEL WITH GFR
ALT: 8 U/L (ref 0–44)
AST: 18 U/L (ref 15–41)
Albumin: 3.1 g/dL — ABNORMAL LOW (ref 3.5–5.0)
Alkaline Phosphatase: 66 U/L (ref 38–126)
Anion gap: 12 (ref 5–15)
BUN: 21 mg/dL (ref 8–23)
CO2: 27 mmol/L (ref 22–32)
Calcium: 8.8 mg/dL — ABNORMAL LOW (ref 8.9–10.3)
Chloride: 103 mmol/L (ref 98–111)
Creatinine, Ser: 0.8 mg/dL (ref 0.44–1.00)
GFR, Estimated: >60 mL/min (ref >60)
Glucose, Bld: 134 mg/dL — ABNORMAL HIGH (ref 70–99)
Potassium: 3.3 mmol/L — ABNORMAL LOW (ref 3.5–5.1)
Sodium: 142 mmol/L (ref 135–145)
Total Bilirubin: 0.5 mg/dL (ref 0.0–1.2)
Total Protein: 6.2 g/dL — ABNORMAL LOW (ref 6.5–8.1)

## 2024-09-30 LAB — LACTIC ACID, PLASMA: Lactic Acid, Venous: 1.5 mmol/L (ref 0.5–1.9)

## 2024-09-30 LAB — TROPONIN I (HIGH SENSITIVITY): Troponin I (High Sensitivity): 10 ng/L (ref <18)

## 2024-09-30 MED ORDER — SODIUM CHLORIDE 0.9 % IV BOLUS (SEPSIS)
500.0000 mL | Freq: Once | INTRAVENOUS | Status: AC
  Administered 2024-09-30: 500 mL via INTRAVENOUS

## 2024-09-30 NOTE — Telephone Encounter (Signed)
 FYI Only or Action Required?: Action required by provider: update on patient condition. ED overnight 10/2-10/3. HFU made 10/6  Patient was last seen in primary care on 09/08/2024 by Alvia Selinda PARAS, MD.  Called Nurse Triage reporting Fall.  Symptoms began yesterday.  Interventions attempted: Other: ED .  Symptoms are: gradually improving.  Triage Disposition: Go to ED Now (Notify PCP)  Patient/caregiver understands and will follow disposition?: Yes  Copied from CRM [id number]. Topic: Clinical - Red Word Triage >> Sep 30, 2024  9:33 AM [place] J wrote: [facility] that prompted transfer to Nurse Triage: Patient fell last night and hurt her head. This is the 3rd fall in 9 months. Reason for Disposition  [1] ACUTE NEURO SYMPTOM AND [2] now fine  (Definition: Difficult to awaken OR confused thinking and talking OR slurred speech OR weakness of arms or legs OR unsteady walking.)  Answer Assessment - Initial Assessment Questions Dizzy after urinating and trying to run back to the bed with her pants around her ankles. She fell off the bed and hit her head on the bedside table. Confused initially. Head Laceration glued in ED. Back to baseline after the ED. Daughter states she is currently sleeping. HFU appt made for Monday. Return to ED precautions given. PT/OT scheduled to come out today. Daughter wants to use Monday appt as platform to discuss pt having overnight help or moving into assisted living since this is her 3rd fall in 9 months.   1. MECHANISM: How did the injury happen? For falls, ask: What height did you fall from? and What surface did you fall against?      Clemens out of bed into the bedside table with her  2. ONSET: When did the injury happen? (e.g., minutes, hours ago)      Last night 3. NEUROLOGIC SYMPTOMS: Was there any loss of consciousness? Are there any other neurological symptoms?      Patient is currently sleeping  4. MENTAL STATUS: Does the person know who they are,  who you are, and where they are?      Was confused and nauseous post fall- ED gave her zofran  5. LOCATION: What part of the head was hit?      Left  6. SCALP APPEARANCE: What does the scalp look like? Is it bleeding now? If Yes, ask: Is it difficult to stop?      Glued together  7. SIZE: For cuts, bruises, or swelling, ask: How large is it? (e.g., inches or centimeters)      2cm 8. PAIN: Is there any pain? If Yes, ask: How bad is it? (Scale 0-10; or none, mild, moderate, severe)     Sore today 10. BLOOD THINNERS: Do you take any blood thinners? (e.g., aspirin, clopidogrel / Plavix, coumadin, heparin). Notes: Other strong blood thinners include: Arixtra (fondaparinux), Eliquis (apixaban), Pradaxa (dabigatran), and Xarelto (rivaroxaban).       ASA 11. OTHER SYMPTOMS: Do you have any other symptoms? (e.g., neck pain, vomiting)       nausea  Protocols used: Head Injury-A-AH

## 2024-09-30 NOTE — ED Notes (Signed)
 Fall bundle in place at this time.

## 2024-09-30 NOTE — Telephone Encounter (Signed)
 Noted  KP

## 2024-09-30 NOTE — ED Notes (Signed)
 EKG given to Dr. Gordan. Purewick catheter placed on pt. Pt reports she does not want to have in and out cath done. Pts belongings placed in bag. Pt given warm blanket and nonskid socks. Denies further needs at this time.

## 2024-09-30 NOTE — ED Provider Notes (Signed)
 [facility] Provider Note    Event Date/Time   First MD Initiated Contact with Patient 09/30/24 [phone]     (approximate)   History   Fall, Laceration, and Nausea   HPI Kristy Sellers is a 83 y.o. female who presents for evaluation after a fall at home.  She got up to go to the bathroom and after using the commode she was walking back to the bedroom when she became lightheaded and then passed out, striking the left side of her head on an object in the room.  She initially felt persistently lightheaded and was briefly nauseated and had an episode of vomiting but that resolved after getting a dose of Zofran  by EMS.  She does not take any blood thinners.  She has little bit of pain in her left shoulder but she knows that she has a fracture in her humerus which has been present from a previous fall for at least 4 weeks.  No new numbness or weakness in her extremities.  She feels better now than she did immediately after the fall.  She has had no chest pain or shortness of breath.  She has had this kind of thing happened in the past although never so suddenly after going to the bathroom.     Physical Exam   Triage Vital Signs: ED Triage Vitals  Encounter Vitals Group     BP 09/30/24 0104 104/74     Girls Systolic BP Percentile --      Girls Diastolic BP Percentile --      Boys Systolic BP Percentile --      Boys Diastolic BP Percentile --      Pulse Rate 09/30/24 0104 76     Resp 09/30/24 0104 18     Temp 09/30/24 0104 98.6 F (37 C)     Temp Source 09/30/24 0104 Oral     SpO2 09/30/24 0055 100 %     Weight 09/30/24 0100 69.1 kg (152 lb 4.8 oz)     Height 09/30/24 0100 1.651 m (5' 5)     Head Circumference --      Peak Flow --      Pain Score 09/30/24 0100 0     Pain Loc --      Pain Education --      Exclude from Growth Chart --     Most recent vital signs: Vitals:   09/30/24 0055 09/30/24 0104  BP:  104/74  Pulse:  76  Resp:  18  Temp:  98.6 F (37  C)  SpO2: 100% 100%    General: Awake, alert, conversant, pleasant.  No distress. Head:  2 cm relatively superficial laceration behind the left ear, bleeding well-controlled.  No injury to the earlobe itself. CV:  Good peripheral perfusion.  Resp:  Normal effort. Speaking easily and comfortably, no accessory muscle usage nor intercostal retractions.   Abd:  No distention.   Other:  No focal neurological deficits.   ED Results / Procedures / Treatments   Labs (all labs ordered are listed, but only abnormal results are displayed) Labs Reviewed  COMPREHENSIVE METABOLIC PANEL WITH GFR - Abnormal; Notable for the following components:      Result Value   Potassium 3.3 (*)    Glucose, Bld 134 (*)    Calcium 8.8 (*)    Total Protein 6.2 (*)    Albumin 3.1 (*)    All other components within normal limits  CBC WITH DIFFERENTIAL/PLATELET -  Abnormal; Notable for the following components:   WBC 13.2 (*)    MCH 25.6 (*)    RDW 16.3 (*)    Neutro Abs 10.4 (*)    All other components within normal limits  LACTIC ACID, PLASMA  LACTIC ACID, PLASMA  URINALYSIS, W/ REFLEX TO CULTURE (INFECTION SUSPECTED)  TROPONIN I (HIGH SENSITIVITY)  TROPONIN I (HIGH SENSITIVITY)     EKG  ED ECG REPORT I, Darleene Dome, the attending physician, personally viewed and interpreted this ECG.  Date: 09/30/2024 EKG Time: 2:38 AM Rate: 60 Rhythm: normal sinus rhythm QRS Axis: normal Intervals: normal ST/T Wave abnormalities: Non-specific ST segment / T-wave changes, but no clear evidence of acute ischemia. Narrative Interpretation: no definitive evidence of acute ischemia; does not meet STEMI criteria.    RADIOLOGY I independently viewed and interpreted the patient's head CT and cervical spine CT I see no evidence of acute intracranial hemorrhage or skull fracture or any injury to the cervical spine.  I also read the radiologist's report, which confirmed no acute findings.  I also independently  viewed and interpreted the patient's chest x-ray and left shoulder x-ray.  It redemonstrates a previous fracture in the humerus but with no acute abnormality.  Radiology confirmed no acute change.   PROCEDURES:  Critical Care performed: No  .1-3 Lead EKG Interpretation  Performed by: Dome Darleene, MD Authorized by: Dome Darleene, MD     Interpretation: normal     ECG rate:  60   ECG rate assessment: normal     Rhythm: sinus rhythm     Ectopy: none     Conduction: normal       IMPRESSION / MDM / ASSESSMENT AND PLAN / ED COURSE  I reviewed the triage vital signs and the nursing notes.                              Differential diagnosis includes, but is not limited to, orthostatic or vasovagal syncope, acute cardiac arrhythmia, MI, acute intracranial hemorrhage, cervical spine injury, fracture, dislocation.  Patient's presentation is most consistent with acute presentation with potential threat to life or bodily function.  Labs/studies ordered: EKG, high-sensitivity troponin, CMP, lactic acid, CBC with differential, chest x-ray, left shoulder x-ray, CT head, CT cervical spine  Interventions/Medications given:  Medications  sodium chloride  0.9 % bolus 500 mL (0 mLs Intravenous Stopped 09/30/24 0337)    (Note:  hospital course my include additional interventions and/or labs/studies not listed above.)   Vital signs are stable and the patient has not been hypotensive.  She is low risk syncope based on [place] syncope rules.  She has had similar issues in the past and it sounds that she most likely had an orthostatic episode after going to the bathroom.  Minor head injury, reassuring imaging.  No ischemia on EKG.  Labs are within normal limits.  Patient is in no distress and comfortable going home and following up as an outpatient.  Her daughter is with her in the room and will take her home.  The patient's medical screening exam is reassuring with no indication of an  emergent medical condition requiring hospitalization or additional evaluation at this point.    The patient was on the cardiac monitor to evaluate for evidence of arrhythmia and/or significant heart rate changes.       FINAL CLINICAL IMPRESSION(S) / ED DIAGNOSES   Final diagnoses:  Fall, initial encounter  Minor head injury,  initial encounter  Scalp laceration, initial encounter  Syncope and collapse     Rx / DC Orders   ED Discharge Orders     None        Note:  This document was prepared using Dragon voice recognition software and may include unintentional dictation errors.   Gordan Huxley, MD 09/30/24 [phone]

## 2024-09-30 NOTE — Discharge Instructions (Signed)
 You have been seen today in the Emergency Department (ED)  for syncope (passing out).  Your workup including labs and EKG show reassuring results.  Your symptoms may be due to mild dehydration, so it is important that you drink plenty of non-alcoholic fluids.  The wound behind your left ear was closed with skin glue and some tape.  We recommend you keep your hair dry for at least 24 hours.  You can gently wash your after that, but try not to get the wound and the tape and glue very wet.  Be very gentle so as not to disrupt the glue.  The glue and tape should fall off on its own in about 1 week and the wound will heal on its own.  Please call your regular doctor as soon as possible to schedule the next available clinic appointment to follow up with him/her regarding your visit to the ED and your symptoms.  Return to the Emergency Department (ED)  if you have any further syncopal episodes (pass out again) or develop ANY chest pain, pressure, tightness, trouble breathing, sudden sweating, or other symptoms that concern you.

## 2024-09-30 NOTE — ED Triage Notes (Signed)
 Chief Complaint  Patient presents with   Fall   Laceration   Nausea   Pt arrives to ED with c/o a fall after becoming dizzy. Pt noted to have laceration above left ear. Bleeding controlled. Pt reports she feels dizzy at this time. Pt also had an episode of vomiting. Pt was given 4mg  IM Zofran . Pt unsure of LOC. Pt does not take blood thinners. Pt reports that she had a left shoulder fracture currently. Unsure if she fell onto shoulder tonight. Pt A&Ox4.

## 2024-10-03 ENCOUNTER — Ambulatory Visit: Admitting: Internal Medicine

## 2024-10-03 ENCOUNTER — Telehealth: Payer: Self-pay

## 2024-10-03 NOTE — Transitions of Care (Post Inpatient/ED Visit) (Signed)
   10/03/2024  Name: Kristy Sellers MRN: [id number] DOB: [birth year]  Today's TOC FU Call Status: Today's TOC FU Call Status:: Successful TOC FU Call Completed TOC FU Call Complete Date: 10/03/24 Patient's Name and Date of Birth confirmed.  Transition Care Management Follow-up Telephone Call Date of Discharge: 09/30/24 Discharge Facility: [facility]) Type of Discharge: Emergency Department Reason for ED Visit: Other: (Fall/ Laceration on Head) Any questions or concerns?: No  Items Reviewed: Did you receive and understand the discharge instructions provided?: Yes Medications obtained,verified, and reconciled?: Yes (Medications Reviewed) Any new allergies since your discharge?: No Dietary orders reviewed?: NA Do you have support at home?: Yes People in Home [RPT]: child(ren), adult Name of Support/Comfort Primary Source: Tariin  Medications Reviewed Today: Medications Reviewed Today     Reviewed by Zelena Bushong N, CMA (Certified Medical Assistant) on 10/03/24 at [phone]  Med List Status: <None>   Medication Order Taking? Sig Documenting Provider Last Dose Status Informant  amLODipine  (NORVASC ) 5 MG tablet [id number] Yes Take 1 tablet (5 mg total) by mouth daily. Justus Leita DEL, MD  Active   aspirin 81 MG tablet [id number] Yes Take 81 mg by mouth at bedtime as needed. [provider]  Active Self  atropine  1 % ophthalmic solution [id number] Yes  [provider]  Active Self  prednisoLONE  acetate (PRED FORTE ) 1 % ophthalmic suspension [id number] Yes  [provider]  Active Self  simvastatin  (ZOCOR ) 10 MG tablet [id number] Yes TAKE 1 TABLET BY MOUTH EVERY DAY  Patient taking differently: Take 10 mg by mouth at bedtime.   Justus Leita DEL, MD  Active Self  Vitamin D , Ergocalciferol , (DRISDOL ) 1.25 MG (50000 UNIT) CAPS capsule [id number] Yes Take 1 capsule (50,000 Units total) by mouth every 7 (seven) days for 8 doses. Take for 8 total  doses(weeks) Alvia Selinda PARAS, MD  Active             Home Care and Equipment/Supplies: Were Home Health Services Ordered?: NA Any new equipment or medical supplies ordered?: NA  Functional Questionnaire: Do you need assistance with bathing/showering or dressing?: No Do you need assistance with meal preparation?: No Do you need assistance with eating?: No Do you have difficulty maintaining continence: No Do you need assistance with getting out of bed/getting out of a chair/moving?: No Do you have difficulty managing or taking your medications?: No  Follow up appointments reviewed: PCP Follow-up appointment confirmed?: Yes Date of PCP follow-up appointment?: 10/03/24 Follow-up Provider: [facility] Follow-up appointment confirmed?: NA Do you need transportation to your follow-up appointment?: No Do you understand care options if your condition(s) worsen?: Yes-patient verbalized understanding    Fin Hupp [place]  Primary Care & Sports Medicine [facility] CMA [facility] [address]  [place] [address] Office [phone]  Fax: [phone]

## 2024-10-04 ENCOUNTER — Telehealth: Payer: Self-pay

## 2024-10-04 ENCOUNTER — Ambulatory Visit (INDEPENDENT_AMBULATORY_CARE_PROVIDER_SITE_OTHER): Admitting: Internal Medicine

## 2024-10-04 ENCOUNTER — Encounter: Payer: Self-pay | Admitting: Internal Medicine

## 2024-10-04 VITALS — BP 128/72 | HR 81 | Ht 65.0 in | Wt 152.0 lb

## 2024-10-04 DIAGNOSIS — I10 Essential (primary) hypertension: Secondary | ICD-10-CM

## 2024-10-04 DIAGNOSIS — M170 Bilateral primary osteoarthritis of knee: Secondary | ICD-10-CM

## 2024-10-04 DIAGNOSIS — S42292D Other displaced fracture of upper end of left humerus, subsequent encounter for fracture with routine healing: Secondary | ICD-10-CM | POA: Diagnosis not present

## 2024-10-04 DIAGNOSIS — R296 Repeated falls: Secondary | ICD-10-CM | POA: Insufficient documentation

## 2024-10-04 NOTE — Assessment & Plan Note (Addendum)
 Well controlled blood pressure today. Current regimen is amlodipine . No evidence of orthostatic changes No medication side effects noted.

## 2024-10-04 NOTE — Assessment & Plan Note (Signed)
 See SM this week to discuss knee gel injections.

## 2024-10-04 NOTE — Assessment & Plan Note (Signed)
 Recommend DEXA

## 2024-10-04 NOTE — Telephone Encounter (Signed)
 Spoke with [name] and informed her that patient does not need new xray's for her upcoming appt with dr. Alvia. Her Xray's from 10/03/205 will be fine to use.  JM   Copied from CRM [id number]. Topic: General - Other >> Oct 04, 2024  9:37 AM Travis F wrote: Reason for CRM: Patient is calling in because she just had an Xray of her shoulder and she wants to know if he can use those results from the other day instead of her having to come in tomorrow for another xray. Please follow up with patient.

## 2024-10-04 NOTE — Assessment & Plan Note (Addendum)
 Multifactorial - no indication of a neurological event such as TIA Recommend PT for balance and strengthening Maintain hydration but stop drinking fluids several hours before bed Move more slowly when sitting to stand up. Keep Life Line alert monitor on at all times. Address OA both knees to improve overall mobility

## 2024-10-04 NOTE — Progress Notes (Signed)
 Date:  10/04/2024   Name:  Kristy Sellers   DOB:  Jul 11, 1941   MRN:  [id number]   Chief Complaint: Fall (Patient is concerned about frequent falls. She said she has fallen 4 times in the last year. She is scared to fall again.)  Hypertension This is a chronic problem. The problem is controlled. Pertinent negatives include no chest pain, headaches, palpitations or shortness of breath. Past treatments include beta blockers. The current treatment provides significant improvement. There are no compliance problems.  There is no history of kidney disease, CAD/MI or CVA.  Falls - since January she has had 3 falls.  The first at home in her living room - standing and getting ready to go for a walk.  Felt very hot and then lightheaded and passed out.  The second time she was walking at [facility] and jumped back when she saw a cockroach - lost her balance and fell, fracturing her left arm.  The third occurred at home during the night.  She got up to the bathroom and became lightheaded.  She tried to reach the bed but fell and hit the side of her head on an armoire.   Review of Systems  Constitutional:  Negative for chills, fatigue and fever.  Respiratory:  Negative for chest tightness and shortness of breath.   Cardiovascular:  Negative for chest pain, palpitations and leg swelling.  Genitourinary:  Positive for urgency.  Musculoskeletal:  Positive for arthralgias (left arm healing well) and gait problem (due to severe OA knees).  Neurological:  Negative for dizziness, light-headedness and headaches.  Psychiatric/Behavioral:  Negative for dysphoric mood and sleep disturbance. The patient is not nervous/anxious.      Lab Results  Component Value Date   NA 142 09/30/2024   K 3.3 (L) 09/30/2024   CO2 27 09/30/2024   GLUCOSE 134 (H) 09/30/2024   BUN 21 09/30/2024   CREATININE 0.80 09/30/2024   CALCIUM 8.8 (L) 09/30/2024   EGFR 82 11/19/2023   GFRNONAA >60 09/30/2024   Lab Results  Component Value  Date   CHOL 182 11/19/2023   HDL 72 11/19/2023   LDLCALC 92 11/19/2023   TRIG 101 11/19/2023   CHOLHDL 2.5 11/19/2023   Lab Results  Component Value Date   TSH 3.288 01/16/2024   No results found for: HGBA1C Lab Results  Component Value Date   WBC 13.2 (H) 09/30/2024   HGB 12.0 09/30/2024   HCT 38.4 09/30/2024   MCV 82.1 09/30/2024   PLT 260 09/30/2024   Lab Results  Component Value Date   ALT 8 09/30/2024   AST 18 09/30/2024   ALKPHOS 66 09/30/2024   BILITOT 0.5 09/30/2024   No results found for: MARIEN BOLLS, VD25OH   Patient Active Problem List   Diagnosis Date Noted   Frequent falls 10/04/2024   Fall 08/26/2024   Closed fracture of head of left humerus 08/26/2024   Pain of left thumb 08/26/2024   Primary osteoarthritis of first carpometacarpal joint of left hand 08/26/2024   Closed fracture of orbit (HCC) 02/03/2024   Closed lamina papyracea fracture (HCC) 01/16/2024   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 01/16/2024   Unqualified visual loss of right eye with normal vision of contralateral eye 12/04/2023   Encounter for long-term (current) use of aspirin 10/26/2023   Personal history of fall 10/26/2023   Mild mood disorder 10/26/2023   Gastroesophageal reflux disease without esophagitis 05/14/2020   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  Chronic lumbar radiculopathy 10/15/2017   Primary osteoarthritis of left hip 10/15/2017   Bilateral primary osteoarthritis of knee 10/15/2017   Mixed hyperlipidemia 06/11/2016   History of Clostridium difficile colitis 06/11/2016   History of positive PPD 06/11/2016   Pseudophakia 05/18/2016   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   Ocular hypertension 05/18/2016   Cataract 05/18/2016   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   Secondary hypotony of eye 09/27/2014   Essential hypertension 09/27/2014    No Known Allergies  Past Surgical History:  Procedure Laterality Date   ABDOMINAL HYSTERECTOMY     BOWEL RESECTION   10/01/2023   Procedure: SMALL BOWEL RESECTION;  Surgeon: Marinda Jayson KIDD, MD;  Location: [facility] ORS;  Service: General;;   COLONOSCOPY  ~2006   normal   LAPAROTOMY N/A 10/01/2023   Procedure: EXPLORATORY LAPAROTOMY;  Surgeon: Marinda Jayson KIDD, MD;  Location: [facility] OR[facility];  Service: General;  Laterality: N/A;   LYSIS OF ADHESION  10/01/2023   Procedure: LYSIS OF ADHESION;  Surgeon: Marinda Jayson KIDD, MD;  Location: [facility] OR[facility];  Service: General;;   RETINAL DETACHMENT SURGERY Right     Social History   Tobacco Use   Smoking status: Former    Current packs/day: 0.00    Average packs/day: 0.5 packs/day for 20.0 years (10.0 ttl pk-yrs)    Types: Cigarettes    Start date: 12/30/1963    Quit date: 12/30/1983    Years since quitting: 40.7   Smokeless tobacco: Never  Vaping Use   Vaping status: Never Used  Substance Use Topics   Alcohol use: No    Alcohol/week: 0.0 standard drinks of alcohol   Drug use: Never     Medication list has been reviewed and updated.  Current Meds  Medication Sig   amLODipine  (NORVASC ) 5 MG tablet Take 1 tablet (5 mg total) by mouth daily.   aspirin 81 MG tablet Take 81 mg by mouth at bedtime as needed.   atropine  1 % ophthalmic solution    prednisoLONE  acetate (PRED FORTE ) 1 % ophthalmic suspension    simvastatin  (ZOCOR ) 10 MG tablet TAKE 1 TABLET BY MOUTH EVERY DAY (Patient taking differently: Take 10 mg by mouth at bedtime.)   Vitamin D , Ergocalciferol , (DRISDOL ) 1.25 MG (50000 UNIT) CAPS capsule Take 1 capsule (50,000 Units total) by mouth every 7 (seven) days for 8 doses. Take for 8 total doses(weeks)       10/04/2024    3:21 PM 02/03/2024    2:00 PM 11/19/2023    8:37 AM 10/14/2023    2:01 PM  GAD 7 : Generalized Anxiety Score  Nervous, Anxious, on Edge 1 0 0 2  Control/stop worrying 1 0 0 2  Worry too much - different things 1 0 0 2  Trouble relaxing 0 0 0 0  Restless 0 0 0 0  Easily annoyed or irritable 0 0 0 0  Afraid - awful might happen 0 1 0 0   Total GAD 7 Score 3 1 0 6  Anxiety Difficulty Not difficult at all Not difficult at all Not difficult at all Somewhat difficult       10/04/2024    3:21 PM 08/26/2024    1:23 PM 02/03/2024    2:00 PM  Depression screen PHQ 2/9  Decreased Interest 0 0 0  Down, Depressed, Hopeless 0 0 0  PHQ - 2 Score 0 0 0  Altered sleeping 1  2  Tired, decreased energy 0  0  Change in appetite 0  0  Feeling bad  or failure about yourself  0  0  Trouble concentrating 0  0  Moving slowly or fidgety/restless 0  0  Suicidal thoughts 0  0  PHQ-9 Score 1  2  Difficult doing work/chores Not difficult at all  Not difficult at all    BP Readings from Last 3 Encounters:  10/04/24 128/72  09/30/24 (!) 149/75  09/08/24 128/60    Physical Exam Constitutional:      Appearance: Normal appearance.  Cardiovascular:     Rate and Rhythm: Normal rate and regular rhythm.     Heart sounds: No murmur heard. Pulmonary:     Effort: Pulmonary effort is normal.     Breath sounds: No wheezing or rhonchi.  Musculoskeletal:     Cervical back: Normal range of motion.     Right lower leg: No edema.     Left lower leg: No edema.  Lymphadenopathy:     Cervical: No cervical adenopathy.  Neurological:     Mental Status: She is alert.     Wt Readings from Last 3 Encounters:  10/04/24 152 lb (68.9 kg)  09/30/24 152 lb 4.8 oz (69.1 kg)  09/08/24 161 lb (73 kg)    BP 128/72 (Patient Position: Standing)   Pulse 81   Ht 5' 5 (1.651 m)   Wt 152 lb (68.9 kg)   SpO2 97%   BMI 25.29 kg/m   Assessment and Plan:  Problem List Items Addressed This Visit       Unprioritized   Bilateral primary osteoarthritis of knee   See SM this week to discuss knee gel injections.      Closed fracture of head of left humerus   Recommend DEXA      Relevant Orders   DG Bone Density   Essential hypertension (Chronic)   Well controlled blood pressure today. Current regimen is amlodipine . No evidence of orthostatic  changes No medication side effects noted.        Frequent falls - Primary   Multifactorial - no indication of a neurological event such as TIA Recommend PT for balance and strengthening Maintain hydration but stop drinking fluids several hours before bed Move more slowly when sitting to stand up. Keep Life Line alert monitor on at all times. Address OA both knees to improve overall mobility       Relevant Orders   Ambulatory referral to Physical Therapy    No follow-ups on file.    Leita HILARIO Adie, MD [facility] [place]

## 2024-10-06 ENCOUNTER — Ambulatory Visit: Admitting: Family Medicine

## 2024-10-06 ENCOUNTER — Ambulatory Visit: Admitting: Internal Medicine

## 2024-10-06 ENCOUNTER — Encounter: Payer: Self-pay | Admitting: Family Medicine

## 2024-10-06 VITALS — BP 130/62 | HR 74 | Ht 65.0 in | Wt 158.6 lb

## 2024-10-06 DIAGNOSIS — R296 Repeated falls: Secondary | ICD-10-CM

## 2024-10-06 DIAGNOSIS — M170 Bilateral primary osteoarthritis of knee: Secondary | ICD-10-CM

## 2024-10-06 DIAGNOSIS — S42292D Other displaced fracture of upper end of left humerus, subsequent encounter for fracture with routine healing: Secondary | ICD-10-CM

## 2024-10-06 NOTE — Patient Instructions (Addendum)
 VISIT SUMMARY:  Today, we reviewed your progress with your left shoulder injury and discussed your ongoing knee symptoms and instability. We also talked about your balance issues and the steps you can take to improve your stability and prevent falls.  YOUR PLAN:  HEALING LEFT SHOULDER FRACTURE: Your left shoulder fracture is healing well, and you are making good progress with home physical therapy. -Continue with home physical therapy to improve shoulder strength and range of motion. -Use your left hand for daily activities to help regain your pre-injury function. -Begin supervised driving in low-traffic areas to assess your shoulder endurance and function.  BILATERAL KNEE OSTEOARTHRITIS: Your knee symptoms and instability are most likely contributing to your balance issues and risk of falls. -Start balance therapy to improve knee stability and overall balance. -We will check with your insurance about coverage for gel injections for your knees. -Consider cortisone injections if your knees become more symptomatic during balance therapy. Contact us  if wanting to proceed with this sooner.  IMPAIRED BALANCE: Your balance issues are most likely related to your knee osteoarthritis and recent falls. -Proceed with balance therapy once insurance approval is obtained. -Monitor your progress in balance therapy and report any issues with knee movement or pain to your primary care physician.

## 2024-10-06 NOTE — Assessment & Plan Note (Signed)
 Impaired balance Related to bilateral knee osteoarthritis and recent falls. Balance therapy recommended to improve stability and prevent falls. - Proceed with balance therapy once scheduled. - Monitor progress in balance therapy and report any issues with knee movement or pain to sports medicine.

## 2024-10-06 NOTE — Assessment & Plan Note (Signed)
 History of Present Illness Kristy Sellers is an 83 year old female who presents for follow-up of her shoulder injury after a fall.  Left shoulder pain and functional limitation - Sustained a left shoulder injury after a fall - Currently undergoing home physical therapy - Imaging demonstrates signs of healing, including new bone formation - Able to perform some activities of daily living, such as taking out the garbage and lifting half-gallon containers - Difficulty with tasks requiring greater strength, such as opening jars and lifting heavier items - Increased use of right hand due to left shoulder impairment - Left-handed and motivated to return to pre-injury functional status  Knee pain and instability - Chronic knee pain with associated instability - Experiencing dizziness and falls - Considering treatment options for knees, including gel injections and cortisone shots to improve stability and function - Previous knee x-rays reviewed  PHYSICAL EXAM - LEFT SHOULDER INSPECTION: Normal shoulder alignment without gross deformity. No swelling, erythema, or ecchymosis. Mild deltoid atrophy consistent with disuse. Skin intact without step-off or visible deformity at the proximal humerus. PALPATION: Mild residual tenderness over the proximal humerus. No tenderness at the clavicle, AC joint, subacromial space, or bicipital groove. No crepitus or bony irregularity. RANGE OF MOTION: Active abduction limited to approximately 90 due to stiffness and discomfort. Passively, an additional 10-15 of abduction is achievable with mild pain. Forward flexion to 150, external and internal rotation near full, painless. STRENGTH: 5/5 strength in abduction, external rotation, and internal rotation. Isolated supraspinatus testing painless. SPECIAL TESTS: Neer's, Hawkins, Speed's, and Yergason's tests negative. No evidence of impingement or instability. NEUROVASCULAR: Sensation intact in axillary, radial, median,  and ulnar nerve distributions. Distal pulses 2+ and symmetric.  Assessment and Plan Healing left humeral head fracture Fracture healing well with new bone formation and stability on x-ray. Progressing with home physical therapy, achieving near-symmetric range of motion and strength. - Continue home physical therapy to improve shoulder strength and range of motion. - Encourage use of the left hand for daily activities to regain pre-injury function. - Begin supervised driving in low-traffic areas to assess shoulder endurance and function.

## 2024-10-06 NOTE — Progress Notes (Signed)
 Primary Care / Sports Medicine Office Visit  Patient Information:  Patient ID: Kristy Sellers, female DOB: Jun 04, 1941 Age: 83 y.o. MRN: [id number]   Kristy Sellers is a pleasant 83 y.o. female presenting with the following:  Chief Complaint  Patient presents with   Shoulder Injury    Patient presents today for a follow up on her healing humerus fx (left). She had new xays on 09/30/2024.     Vitals:   10/06/24 0932  BP: 130/62  Pulse: 74  SpO2: 95%   Vitals:   10/06/24 0932  Weight: 158 lb 9.6 oz (71.9 kg)  Height: 5' 5 (1.651 m)   Body mass index is 26.39 kg/m.     Discussed the use of AI scribe software for clinical note transcription with the patient, who gave verbal consent to proceed.   Independent interpretation of notes and tests performed by another provider:   Left Shoulder X-ray 09/30/2024: Evidence of cortical healing with both opacity along fracture line and callus formation, no interval displacement compared to prior X-rays.  Procedures performed:   None  Pertinent History, Exam, Impression, and Recommendations:   Problem List Items Addressed This Visit     Bilateral primary osteoarthritis of knee - Primary   Knee pain and instability - Chronic knee pain with associated instability - Experiencing dizziness and falls - Considering treatment options for knees, including gel injections and cortisone shots to improve stability and function - Previous knee x-rays obtained showing osteoarthritis bilaterally  Bilateral knee osteoarthritis Contributing to impaired balance and potential falls. Patient hesitant about cortisone injections, open to gel injections. Cortisone provides rapid relief, gel injections act as maintenance lubricant. - Initiate balance therapy to address knee stability and improve balance. - Reach out to insurance to determine coverage for gel injections. - Consider cortisone injection if knees become symptomatic during balance  therapy.       Closed fracture of head of left humerus   History of Present Illness Kristy Sellers is an 83 year old female who presents for follow-up of her shoulder injury after a fall.  Left shoulder pain and functional limitation - Sustained a left shoulder injury after a fall - Currently undergoing home physical therapy - Imaging demonstrates signs of healing, including new bone formation - Able to perform some activities of daily living, such as taking out the garbage and lifting half-gallon containers - Difficulty with tasks requiring greater strength, such as opening jars and lifting heavier items - Increased use of right hand due to left shoulder impairment - Left-handed and motivated to return to pre-injury functional status  Knee pain and instability - Chronic knee pain with associated instability - Experiencing dizziness and falls - Considering treatment options for knees, including gel injections and cortisone shots to improve stability and function - Previous knee x-rays reviewed  PHYSICAL EXAM - LEFT SHOULDER INSPECTION: Normal shoulder alignment without gross deformity. No swelling, erythema, or ecchymosis. Mild deltoid atrophy consistent with disuse. Skin intact without step-off or visible deformity at the proximal humerus. PALPATION: Mild residual tenderness over the proximal humerus. No tenderness at the clavicle, AC joint, subacromial space, or bicipital groove. No crepitus or bony irregularity. RANGE OF MOTION: Active abduction limited to approximately 90 due to stiffness and discomfort. Passively, an additional 10-15 of abduction is achievable with mild pain. Forward flexion to 150, external and internal rotation near full, painless. STRENGTH: 5/5 strength in abduction, external rotation, and internal rotation. Isolated supraspinatus testing painless. SPECIAL TESTS: Neer's, [name], Speed's, and [name]  tests negative. No evidence of impingement or  instability. NEUROVASCULAR: Sensation intact in axillary, radial, median, and ulnar nerve distributions. Distal pulses 2+ and symmetric.  Assessment and Plan Healing left humeral head fracture Fracture healing well with new bone formation and stability on x-ray. Progressing with home physical therapy, achieving near-symmetric range of motion and strength. - Continue home physical therapy to improve shoulder strength and range of motion. - Encourage use of the left hand for daily activities to regain pre-injury function. - Begin supervised driving in low-traffic areas to assess shoulder endurance and function.      Frequent falls   Impaired balance Related to bilateral knee osteoarthritis and recent falls. Balance therapy recommended to improve stability and prevent falls. - Proceed with balance therapy once scheduled. - Monitor progress in balance therapy and report any issues with knee movement or pain to sports medicine.        Orders & Medications Medications: No orders of the defined types were placed in this encounter.  No orders of the defined types were placed in this encounter.    Return if symptoms worsen or fail to improve.     Selinda JINNY Ku, MD, [facility]   Primary Care Sports Medicine Primary Care and Sports Medicine at [facility] [place]

## 2024-10-06 NOTE — Assessment & Plan Note (Signed)
 Knee pain and instability - Chronic knee pain with associated instability - Experiencing dizziness and falls - Considering treatment options for knees, including gel injections and cortisone shots to improve stability and function - Previous knee x-rays obtained showing osteoarthritis bilaterally  Bilateral knee osteoarthritis Contributing to impaired balance and potential falls. Patient hesitant about cortisone injections, open to gel injections. Cortisone provides rapid relief, gel injections act as maintenance lubricant. - Initiate balance therapy to address knee stability and improve balance. - Reach out to insurance to determine coverage for gel injections. - Consider cortisone injection if knees become symptomatic during balance therapy.

## 2024-10-12 ENCOUNTER — Other Ambulatory Visit: Payer: Self-pay | Admitting: Internal Medicine

## 2024-10-12 DIAGNOSIS — Z1231 Encounter for screening mammogram for malignant neoplasm of breast: Secondary | ICD-10-CM

## 2024-10-13 NOTE — Telephone Encounter (Signed)
 Monovisc is not covered under patient prescription benefits and may only be covered under the major medical benefits as buy and bill. [id number] is covered 100 % of the contracted rate when performed in an office setting. A pre-cert is needed for J7327.   JM

## 2024-10-14 ENCOUNTER — Other Ambulatory Visit: Payer: Self-pay | Admitting: Internal Medicine

## 2024-10-14 DIAGNOSIS — E782 Mixed hyperlipidemia: Secondary | ICD-10-CM

## 2024-10-17 NOTE — Telephone Encounter (Signed)
 Requested by interface surescripts. Future visit 11/23/24.  Requested Prescriptions  Pending Prescriptions Disp Refills   simvastatin  (ZOCOR ) 10 MG tablet [Pharmacy Med Name: SIMVASTATIN  10 MG TABLET] 90 tablet 0    Sig: TAKE 1 TABLET BY MOUTH EVERY DAY     Cardiovascular:  Antilipid - Statins Failed - 10/17/2024  2:51 PM      Failed - Lipid Panel in normal range within the last 12 months    Cholesterol, Total  Date Value Ref Range Status  11/19/2023 182 100 - 199 mg/dL Final   LDL Chol Calc (NIH)  Date Value Ref Range Status  11/19/2023 92 0 - 99 mg/dL Final   HDL  Date Value Ref Range Status  11/19/2023 72 >39 mg/dL Final   Triglycerides  Date Value Ref Range Status  11/19/2023 101 0 - 149 mg/dL Final         Passed - Patient is not pregnant      Passed - Valid encounter within last 12 months    Recent Outpatient Visits           1 week ago Closed fracture of head of left humerus with routine healing, subsequent encounter   [place] [facility], Selinda PARAS, MD   1 week ago Frequent falls   [facility] at [facility], Leita DEL, MD   1 month ago Closed fracture of head of left humerus with routine healing, subsequent encounter   [facility], Selinda PARAS, MD   1 month ago Closed fracture of head of left humerus with routine healing, subsequent encounter   [facility], Selinda PARAS, MD   1 month ago Primary osteoarthritis of first carpometacarpal joint of left hand   [place] [facility] at [facility], Selinda PARAS, MD       Future Appointments             In 1 month Justus, Leita DEL, MD [facility] Medicine at [facility], [phone] [place]

## 2024-10-18 ENCOUNTER — Ambulatory Visit

## 2024-10-24 ENCOUNTER — Other Ambulatory Visit: Payer: Self-pay | Admitting: Pharmacist

## 2024-10-24 DIAGNOSIS — E782 Mixed hyperlipidemia: Secondary | ICD-10-CM

## 2024-10-24 NOTE — Progress Notes (Signed)
   10/24/2024  Patient ID: Kristy Sellers, female   DOB: 1941-09-08, 83 y.o.   MRN: [id number]  This patient is appearing on a report for being at risk of failing the adherence measure for cholesterol (statin) medications this calendar year.   Medication: simvastatin  10 mg  Last fill date: 05/27/2024 for 90 day supply  Outreach to patient today by telephone. Denies barriers to obtaining or taking her simvastatin  prescription. Reports picked up on Sunday  Outreach to [facility] today on behalf of patient and speak with Darius. Confirms patient picked up 90 day supply of her simvastatin  prescription on 10/23/2024 and that this was billed through her [employer] plan.  No further action needed.  Sharyle Sia, PharmD, [facility] [phone]

## 2024-11-04 ENCOUNTER — Telehealth: Payer: Self-pay

## 2024-11-04 NOTE — Telephone Encounter (Signed)
 Copied from CRM #[id number]. Topic: Clinical - Home Health Verbal Orders >> Nov 04, 2024  2:04 PM Amy B wrote: Caller/Agency: [facility] Callback Number: [phone] Service Requested: Occupational Therapy Frequency: once a week for 8 weeks Any new concerns about the patient? No

## 2024-11-04 NOTE — Telephone Encounter (Signed)
 OT verbal orders given.

## 2024-11-17 ENCOUNTER — Ambulatory Visit
Admission: RE | Admit: 2024-11-17 | Discharge: 2024-11-17 | Disposition: A | Discharge status: Home / Self Care | Source: Ambulatory Visit | Attending: Internal Medicine | Admitting: Internal Medicine

## 2024-11-17 DIAGNOSIS — Z1231 Encounter for screening mammogram for malignant neoplasm of breast: Secondary | ICD-10-CM | POA: Diagnosis present

## 2024-11-23 ENCOUNTER — Ambulatory Visit (INDEPENDENT_AMBULATORY_CARE_PROVIDER_SITE_OTHER): Admitting: Internal Medicine

## 2024-11-23 ENCOUNTER — Encounter: Payer: Self-pay | Admitting: Internal Medicine

## 2024-11-23 VITALS — BP 125/82 | HR 74 | Ht 65.0 in | Wt 158.0 lb

## 2024-11-23 DIAGNOSIS — E782 Mixed hyperlipidemia: Secondary | ICD-10-CM | POA: Diagnosis not present

## 2024-11-23 DIAGNOSIS — I10 Essential (primary) hypertension: Secondary | ICD-10-CM | POA: Diagnosis not present

## 2024-11-23 DIAGNOSIS — Z1231 Encounter for screening mammogram for malignant neoplasm of breast: Secondary | ICD-10-CM

## 2024-11-23 DIAGNOSIS — Z0000 Encounter for general adult medical examination without abnormal findings: Secondary | ICD-10-CM | POA: Diagnosis not present

## 2024-11-23 DIAGNOSIS — Z13820 Encounter for screening for osteoporosis: Secondary | ICD-10-CM | POA: Diagnosis not present

## 2024-11-23 NOTE — Progress Notes (Signed)
 Date:  11/23/2024   Name:  Kristy Sellers   DOB:  1941-03-04   MRN:  [id number]   Chief Complaint: Annual Exam Kristy Sellers is a 83 y.o. female who presents today for her Complete Annual Exam. She feels poorly. She reports exercising does PT/ OT 2 days a week. She reports she is sleeping poorly. Breast complaints none.  Health Maintenance  Topic Date Due   Osteoporosis screening with Bone Density Scan  05/29/2018   Medicare Annual Wellness Visit  05/21/2024   Zoster (Shingles) Vaccine (1 of 2) 11/26/2024*   COVID-19 Vaccine (7 - 2025-26 season) 12/09/2024*   DTaP/Tdap/Td vaccine (2 - Td or Tdap) 08/26/2025*   Pneumococcal Vaccine for age over 28  Completed   Flu Shot  Completed   Meningitis B Vaccine  Aged Out  *Topic was postponed. The date shown is not the original due date.    Hypertension This is a chronic problem. The problem is controlled. Pertinent negatives include no chest pain, headaches, palpitations or shortness of breath. Past treatments include calcium channel blockers. The current treatment provides significant improvement.  Hyperlipidemia This is a chronic problem. The problem is controlled. Pertinent negatives include no chest pain, myalgias or shortness of breath. Current antihyperlipidemic treatment includes statins. The current treatment provides significant improvement of lipids.    Review of Systems  Constitutional:  Negative for fatigue and unexpected weight change.  HENT:  Negative for trouble swallowing.   Eyes:  Negative for visual disturbance.  Respiratory:  Negative for cough, chest tightness, shortness of breath and wheezing.   Cardiovascular:  Negative for chest pain, palpitations and leg swelling.  Gastrointestinal:  Negative for abdominal pain, constipation and diarrhea.  Musculoskeletal:  Negative for arthralgias and myalgias.  Neurological:  Negative for dizziness, weakness, light-headedness and headaches.     Lab Results  Component Value  Date   NA 142 09/30/2024   K 3.3 (L) 09/30/2024   CO2 27 09/30/2024   GLUCOSE 134 (H) 09/30/2024   BUN 21 09/30/2024   CREATININE 0.80 09/30/2024   CALCIUM 8.8 (L) 09/30/2024   EGFR 82 11/19/2023   GFRNONAA >60 09/30/2024   Lab Results  Component Value Date   CHOL 182 11/19/2023   HDL 72 11/19/2023   LDLCALC 92 11/19/2023   TRIG 101 11/19/2023   CHOLHDL 2.5 11/19/2023   Lab Results  Component Value Date   TSH 3.288 01/16/2024   No results found for: HGBA1C Lab Results  Component Value Date   WBC 13.2 (H) 09/30/2024   HGB 12.0 09/30/2024   HCT 38.4 09/30/2024   MCV 82.1 09/30/2024   PLT 260 09/30/2024   Lab Results  Component Value Date   ALT 8 09/30/2024   AST 18 09/30/2024   ALKPHOS 66 09/30/2024   BILITOT 0.5 09/30/2024   No results found for: MARIEN BOLLS, VD25OH   Patient Active Problem List   Diagnosis Date Noted   Closed fracture of head of left humerus 08/26/2024   Primary osteoarthritis of first carpometacarpal joint of left hand 08/26/2024   Closed fracture of orbit (HCC) 02/03/2024   Closed lamina papyracea fracture (HCC) 01/16/2024   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 01/16/2024   Encounter for long-term (current) use of aspirin 10/26/2023   Mild mood disorder 10/26/2023   Gastroesophageal reflux disease without esophagitis 05/14/2020   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   Chronic lumbar radiculopathy 10/15/2017   Primary osteoarthritis of left hip 10/15/2017   Bilateral primary  osteoarthritis of knee 10/15/2017   Mixed hyperlipidemia 06/11/2016   History of Clostridium difficile colitis 06/11/2016   History of positive PPD 06/11/2016   Ocular hypertension 05/18/2016   Cataract 05/18/2016   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   Essential hypertension 09/27/2014    No Known Allergies  Past Surgical History:  Procedure Laterality Date   ABDOMINAL HYSTERECTOMY     BOWEL RESECTION  10/01/2023   Procedure: SMALL BOWEL  RESECTION;  Surgeon: Marinda Jayson KIDD, MD;  Location: [facility] ORS;  Service: General;;   COLONOSCOPY  ~2006   normal   LAPAROTOMY N/A 10/01/2023   Procedure: EXPLORATORY LAPAROTOMY;  Surgeon: Marinda Jayson KIDD, MD;  Location: [facility];  Service: General;  Laterality: N/A;   LYSIS OF ADHESION  10/01/2023   Procedure: LYSIS OF ADHESION;  Surgeon: Marinda Jayson KIDD, MD;  Location: [facility];  Service: General;;   RETINAL DETACHMENT SURGERY Right     Social History   Tobacco Use   Smoking status: Former    Current packs/day: 0.00    Average packs/day: 0.5 packs/day for 20.0 years (10.0 ttl pk-yrs)    Types: Cigarettes    Start date: 12/30/1963    Quit date: 12/30/1983    Years since quitting: 40.9   Smokeless tobacco: Never  Vaping Use   Vaping status: Never Used  Substance Use Topics   Alcohol use: No    Alcohol/week: 0.0 standard drinks of alcohol   Drug use: Never     Medication list has been reviewed and updated.  Current Meds  Medication Sig   amLODipine  (NORVASC ) 5 MG tablet Take 1 tablet (5 mg total) by mouth daily.   aspirin 81 MG tablet Take 81 mg by mouth at bedtime as needed.   atropine  1 % ophthalmic solution    prednisoLONE  acetate (PRED FORTE ) 1 % ophthalmic suspension    simvastatin  (ZOCOR ) 10 MG tablet TAKE 1 TABLET BY MOUTH EVERY DAY       11/23/2024    8:36 AM 10/04/2024    3:21 PM 02/03/2024    2:00 PM 11/19/2023    8:37 AM  GAD 7 : Generalized Anxiety Score  Nervous, Anxious, on Edge 1 1 0 0  Control/stop worrying 1 1 0 0  Worry too much - different things 1 1 0 0  Trouble relaxing 0 0 0 0  Restless 0 0 0 0  Easily annoyed or irritable 0 0 0 0  Afraid - awful might happen  0 1 0  Total GAD 7 Score  3 1 0  Anxiety Difficulty Not difficult at all Not difficult at all Not difficult at all Not difficult at all       11/23/2024    8:36 AM 10/04/2024    3:21 PM 08/26/2024    1:23 PM  Depression screen PHQ 2/9  Decreased Interest 0 0 0  Down, Depressed,  Hopeless 0 0 0  PHQ - 2 Score 0 0 0  Altered sleeping  1   Tired, decreased energy  0   Change in appetite  0   Feeling bad or failure about yourself   0   Trouble concentrating  0   Moving slowly or fidgety/restless  0   Suicidal thoughts  0   PHQ-9 Score  1    Difficult doing work/chores  Not difficult at all      Data saved with a previous flowsheet row definition    BP Readings from Last 3 Encounters:  11/23/24 125/82  10/06/24  130/62  10/04/24 128/72    Physical Exam Vitals and nursing note reviewed.  Constitutional:      General: She is not in acute distress.    Appearance: She is well-developed.  HENT:     Head: Normocephalic and atraumatic.     Right Ear: Tympanic membrane and ear canal normal.     Left Ear: Tympanic membrane and ear canal normal.     Nose:     Right Sinus: No maxillary sinus tenderness.     Left Sinus: No maxillary sinus tenderness.  Eyes:     General: No scleral icterus.       Right eye: No discharge.        Left eye: No discharge.     Conjunctiva/sclera: Conjunctivae normal.  Neck:     Thyroid : No thyromegaly.     Vascular: No carotid bruit.  Cardiovascular:     Rate and Rhythm: Normal rate and regular rhythm.     Pulses: Normal pulses.     Heart sounds: Normal heart sounds.  Pulmonary:     Effort: Pulmonary effort is normal. No respiratory distress.     Breath sounds: No wheezing.  Abdominal:     General: Bowel sounds are normal.     Palpations: Abdomen is soft.     Tenderness: There is no abdominal tenderness.  Musculoskeletal:     Cervical back: Normal range of motion. No erythema.     Right lower leg: No edema.     Left lower leg: No edema.  Lymphadenopathy:     Cervical: No cervical adenopathy.  Skin:    General: Skin is warm and dry.     Capillary Refill: Capillary refill takes less than 2 seconds.     Findings: No rash.  Neurological:     Mental Status: She is alert and oriented to person, place, and time.     Cranial  Nerves: No cranial nerve deficit.     Sensory: No sensory deficit.     Deep Tendon Reflexes: Reflexes are normal and symmetric.  Psychiatric:        Attention and Perception: Attention normal.        Mood and Affect: Mood normal.     Wt Readings from Last 3 Encounters:  11/23/24 158 lb (71.7 kg)  10/06/24 158 lb 9.6 oz (71.9 kg)  10/04/24 152 lb (68.9 kg)    BP 125/82   Pulse 74   Ht 5' 5 (1.651 m)   Wt 158 lb (71.7 kg)   SpO2 95%   BMI 26.29 kg/m   Assessment and Plan:  Problem List Items Addressed This Visit       Unprioritized   Essential hypertension (Chronic)   BP has been higher lately - 140's.  She is probably eating more sodium since her son has been bringing food. She plans to resume cooking for herself and can limit sodium more effectively. Current regimen is amlodipine .  Blood pressure today is acceptable. Continue same regimen, recheck in 2 mo.         Relevant Orders   CBC with Differential/Platelet   Comprehensive metabolic panel with GFR   TSH   Urinalysis, Routine w reflex microscopic   Mixed hyperlipidemia (Chronic)   LDL is  Lab Results  Component Value Date   LDLCALC 92 11/19/2023    Current medication regimen is simvastatin . Goal LDL is < 100.       Relevant Orders   Lipid panel   Other Visit Diagnoses  Annual physical exam    -  Primary   up to date on immunizations needs to schedule DEXA     Encounter for screening for osteoporosis       Dexa ordered but patient has not scheduled.     Encounter for screening mammogram for breast cancer       done yesterday - BiRads 1       Return in about 2 months (around 01/23/2025) for HTN.    Leita HILARIO Adie, MD [facility] Primary Care and Sports Medicine [place]

## 2024-11-23 NOTE — Assessment & Plan Note (Signed)
 LDL is  Lab Results  Component Value Date   LDLCALC 92 11/19/2023    Current medication regimen is simvastatin . Goal LDL is < 100.

## 2024-11-23 NOTE — Patient Instructions (Addendum)
 Call [facility] to schedule your Bone Density at [phone].   Consider getting the RSV one time vaccine.

## 2024-11-23 NOTE — Assessment & Plan Note (Addendum)
 BP has been higher lately - 140's.  She is probably eating more sodium since her son has been bringing food. She plans to resume cooking for herself and can limit sodium more effectively. Current regimen is amlodipine .  Blood pressure today is acceptable. Continue same regimen, recheck in 2 mo.

## 2024-11-29 ENCOUNTER — Ambulatory Visit: Payer: Self-pay | Admitting: Internal Medicine

## 2024-11-29 LAB — CBC WITH DIFFERENTIAL/PLATELET
Basophils Absolute: 0.1 x10E3/uL (ref 0.0–0.2)
Basos: 1 %
EOS (ABSOLUTE): 0.1 x10E3/uL (ref 0.0–0.4)
Eos: 2 %
Hematocrit: 42.2 % (ref 34.0–46.6)
Hemoglobin: 12.9 g/dL (ref 11.1–15.9)
Immature Grans (Abs): 0 x10E3/uL (ref 0.0–0.1)
Immature Granulocytes: 0 %
Lymphocytes Absolute: 2.5 x10E3/uL (ref 0.7–3.1)
Lymphs: 27 %
MCH: 25.2 pg — ABNORMAL LOW (ref 26.6–33.0)
MCHC: 30.6 g/dL — ABNORMAL LOW (ref 31.5–35.7)
MCV: 82 fL (ref 79–97)
Monocytes Absolute: 0.6 x10E3/uL (ref 0.1–0.9)
Monocytes: 7 %
Neutrophils Absolute: 5.9 x10E3/uL (ref 1.4–7.0)
Neutrophils: 63 %
Platelets: 335 x10E3/uL (ref 150–450)
RBC: 5.12 x10E6/uL (ref 3.77–5.28)
RDW: 15.7 % — ABNORMAL HIGH (ref 11.7–15.4)
WBC: 9.3 x10E3/uL (ref 3.4–10.8)

## 2024-11-29 LAB — URINALYSIS, ROUTINE W REFLEX MICROSCOPIC
Bilirubin, UA: NEGATIVE
Glucose, UA: NEGATIVE
Ketones, UA: NEGATIVE
Nitrite, UA: NEGATIVE
Protein,UA: NEGATIVE
RBC, UA: NEGATIVE
Specific Gravity, UA: 1.014 (ref 1.005–1.030)
Urobilinogen, Ur: 0.2 mg/dL (ref 0.2–1.0)
pH, UA: 6.5 (ref 5.0–7.5)

## 2024-11-29 LAB — COMPREHENSIVE METABOLIC PANEL WITH GFR
ALT: 8 IU/L (ref 0–32)
AST: 19 IU/L (ref 0–40)
Albumin: 4.2 g/dL (ref 3.7–4.7)
Alkaline Phosphatase: 80 IU/L (ref 48–129)
BUN/Creatinine Ratio: 16 (ref 12–28)
BUN: 14 mg/dL (ref 8–27)
Bilirubin Total: 0.3 mg/dL (ref 0.0–1.2)
CO2: 25 mmol/L (ref 20–29)
Calcium: 9.6 mg/dL (ref 8.7–10.3)
Chloride: 102 mmol/L (ref 96–106)
Creatinine, Ser: 0.88 mg/dL (ref 0.57–1.00)
Globulin, Total: 3 g/dL (ref 1.5–4.5)
Glucose: 75 mg/dL (ref 70–99)
Potassium: 4 mmol/L (ref 3.5–5.2)
Sodium: 143 mmol/L (ref 134–144)
Total Protein: 7.2 g/dL (ref 6.0–8.5)
eGFR: 65 mL/min/1.73 (ref >59)

## 2024-11-29 LAB — MICROSCOPIC EXAMINATION
Casts: NONE SEEN /LPF
Epithelial Cells (non renal): >10 /HPF — AB (ref 0–10)

## 2024-11-29 LAB — TSH: TSH: 2.55 u[IU]/mL (ref 0.450–4.500)

## 2024-11-29 LAB — LIPID PANEL
Chol/HDL Ratio: 2.6 ratio (ref 0.0–4.4)
Cholesterol, Total: 179 mg/dL (ref 100–199)
HDL: 68 mg/dL (ref >39)
LDL Chol Calc (NIH): 91 mg/dL (ref 0–99)
Triglycerides: 116 mg/dL (ref 0–149)
VLDL Cholesterol Cal: 20 mg/dL (ref 5–40)

## 2024-11-30 ENCOUNTER — Ambulatory Visit

## 2024-12-26 ENCOUNTER — Encounter: Payer: Self-pay | Admitting: Family Medicine

## 2024-12-26 ENCOUNTER — Ambulatory Visit (INDEPENDENT_AMBULATORY_CARE_PROVIDER_SITE_OTHER): Admitting: Family Medicine

## 2024-12-26 ENCOUNTER — Telehealth: Payer: Self-pay | Admitting: Family Medicine

## 2024-12-26 VITALS — BP 150/80 | HR 87 | Ht 65.0 in | Wt 160.0 lb

## 2024-12-26 DIAGNOSIS — M5412 Radiculopathy, cervical region: Secondary | ICD-10-CM | POA: Insufficient documentation

## 2024-12-26 DIAGNOSIS — M170 Bilateral primary osteoarthritis of knee: Secondary | ICD-10-CM | POA: Diagnosis not present

## 2024-12-26 DIAGNOSIS — M25812 Other specified joint disorders, left shoulder: Secondary | ICD-10-CM | POA: Insufficient documentation

## 2024-12-26 NOTE — Assessment & Plan Note (Signed)
 See additional assessment(s) for plan details.

## 2024-12-26 NOTE — Assessment & Plan Note (Signed)
 Newly noted left shoulder pain for the past few weeks in the setting of active recovery from closed fracture of left humeral head, currently receiving home health physical therapy and Occupational Therapy.  Noted the symptoms after the introduction of weights to her regimen, pain with overhead and reaching behind her back, occasional tingling throughout the entirety of the left upper extremity, no weakness.  Findings are most consistent with left rotator cuff impingement with intermittent left cervical radiculopathy.  Plan: - Update home health PT referral to include left shoulder impingement and left cervical radiculopathy - Can use Voltaren gel for as needed pain control - Follow-up if still problematic despite the above discussed next steps, can consider updated imaging, subacromial corticosteroid injections, etc.

## 2024-12-26 NOTE — Assessment & Plan Note (Signed)
 Chronic bilateral knee pain localized to the posterior knees bilaterally, exacerbated by sitting to standing, entering/exiting car, prolonged knee flexion, squats.  Has had some modest relief from OTC Voltaren gel used sparingly.  Examination demonstrates tenderness along the left medial patellar facet, crepitus with active and passive ROM, range of motion limited to flexion 9 degrees on the right, 92 degrees on the left, question mechanical obstruction, severe pain.  Joint lines minimally tender and no laxity with anterior/posterior and varus/valgus stressing.  Assessment/plan Patient with bilateral tricompartmental knee osteoarthritis with focality of symptoms to the patellofemoral articulation bilaterally.  We discussed various surgical and nonsurgical treatment strategies and she is amenable to the following - Updated home health PT referral to include both knees - Use Voltaren gel up to 4 times a day on a consistent basis while the knees are symptomatic (apply the medication circumferentially around the knee) - We will seek authorization for viscosupplementation (gel injections) for both knees - We will contact you for scheduling once we receive authorization - Contact us  if any symptoms worsen, can coordinate a sooner visit for possible medications and/or cortisone injection(s)

## 2024-12-26 NOTE — Progress Notes (Signed)
 "    Primary Care / Sports Medicine Office Visit  Patient Information:  Patient ID: Kristy Sellers, female DOB: 08-03-1941 Age: 83 y.o. MRN: [id number]   Kristy Sellers is a pleasant 83 y.o. female presenting with the following:  Chief Complaint  Patient presents with   Knee Pain    Patient would like to discuss gel injections today.     Vitals:   12/26/24 0928  BP: (!) 150/80  Pulse: 87  SpO2: 96%   Vitals:   12/26/24 0928  Weight: 160 lb (72.6 kg)  Height: 5' 5 (1.651 m)   Body mass index is 26.63 kg/m.  No results found.   Discussed the use of AI scribe software for clinical note transcription with the patient, who gave verbal consent to proceed.   Independent interpretation of notes and tests performed by another provider:   None  Procedures performed:   None  Pertinent History, Exam, Impression, and Recommendations:   Problem List Items Addressed This Visit     Bilateral primary osteoarthritis of knee - Primary   Chronic bilateral knee pain localized to the posterior knees bilaterally, exacerbated by sitting to standing, entering/exiting car, prolonged knee flexion, squats.  Has had some modest relief from OTC Voltaren gel used sparingly.  Examination demonstrates tenderness along the left medial patellar facet, crepitus with active and passive ROM, range of motion limited to flexion 9 degrees on the right, 92 degrees on the left, question mechanical obstruction, severe pain.  Joint lines minimally tender and no laxity with anterior/posterior and varus/valgus stressing.  Assessment/plan Patient with bilateral tricompartmental knee osteoarthritis with focality of symptoms to the patellofemoral articulation bilaterally.  We discussed various surgical and nonsurgical treatment strategies and she is amenable to the following - Updated home health PT referral to include both knees - Use Voltaren gel up to 4 times a day on a consistent basis while the knees are  symptomatic (apply the medication circumferentially around the knee) - We will seek authorization for viscosupplementation (gel injections) for both knees - We will contact you for scheduling once we receive authorization - Contact us  if any symptoms worsen, can coordinate a sooner visit for possible medications and/or cortisone injection(s)      Relevant Orders   Ambulatory referral to Home Health   Impingement of left shoulder   Newly noted left shoulder pain for the past few weeks in the setting of active recovery from closed fracture of left humeral head, currently receiving home health physical therapy and Occupational Therapy.  Noted the symptoms after the introduction of weights to her regimen, pain with overhead and reaching behind her back, occasional tingling throughout the entirety of the left upper extremity, no weakness.  Findings are most consistent with left rotator cuff impingement with intermittent left cervical radiculopathy.  Plan: - Update home health PT referral to include left shoulder impingement and left cervical radiculopathy - Can use Voltaren gel for as needed pain control - Follow-up if still problematic despite the above discussed next steps, can consider updated imaging, subacromial corticosteroid injections, etc.      Relevant Orders   Ambulatory referral to Home Health   Left cervical radiculopathy   See additional assessment(s) for plan details.      Relevant Orders   Ambulatory referral to Home Health     Orders & Medications Medications: No orders of the defined types were placed in this encounter.  Orders Placed This Encounter  Procedures   Ambulatory referral to Home Health  No follow-ups on file.     Kristy JINNY Ku, MD, [facility]   Primary Care Sports Medicine Primary Care and Sports Medicine at [facility]   "

## 2024-12-26 NOTE — Patient Instructions (Signed)
 Discharge Instructions  You were seen for long-lasting pain in both knees and new pain in your left shoulder and neck. We found arthritis in both knees and irritation of the shoulder tendons, with some nerve pain from the neck. We made a plan to treat pain, improve movement, and start therapy at home.  Your Medicines:  - Voltaren (diclofenac) gel: This helps lower pain and swelling.   - How to use: Apply to the sore area up to 4 times a day.   - Knees: Rub the gel all the way around each knee.   - Shoulder: Use as needed for pain.   - Why: Helps pain so you can move and do therapy.  What to Do at Home:  - Home health physical therapy:   - Therapy will work on both knees, your left shoulder, and your neck.   - Why: To improve strength, movement, and pain. - Activity:   - Keep moving, but avoid deep squats, long periods of bent knees, and heavy overhead lifting.   - Stand up slowly from sitting. Take breaks during long car rides. - Pain care:   - Use Voltaren gel as directed.   - Mild soreness after therapy is normal. Sharp or worsening pain is not. - What to expect:   - Pain and movement should slowly improve over weeks with regular therapy. - Next steps:   - We will work on approval for gel injections for both knees.   - We will call you to schedule once approved.  Warning Signs:  - Go to the Emergency Room if:   - You have new weakness, loss of feeling, or trouble using your arm or leg.   - You have severe pain after a fall or injury. - Call your doctor if:   - Pain gets worse or does not improve with therapy and gel.   - You have new numbness or tingling that keeps getting worse.   - You cannot do daily activities because of pain.  If symptoms worsen, contact our office to arrange a sooner visit to discuss other treatment options.

## 2025-01-02 ENCOUNTER — Telehealth: Payer: Self-pay

## 2025-01-02 NOTE — Telephone Encounter (Signed)
 Copied from CRM [id number]. Topic: Referral - Request for Referral >> Jan 02, 2025 10:55 AM Darshell M wrote: Did the patient discuss referral with their provider in the last year? Yes (If No - schedule appointment) (If Yes - send message)  Appointment offered? No  Type of order/referral and detailed reason for visit: Orthopedic Referral  Preference of office, provider, location: [facility]  If referral order, have you been seen by this specialty before? No (If Yes, this issue or another issue? When? Where?  Unable to do exercises at home. Needs referral to facility  Can we respond through MyChart? Yes

## 2025-01-02 NOTE — Telephone Encounter (Signed)
 Please advise on patient's request for referral to Orthopedics. Thank you!

## 2025-01-03 DIAGNOSIS — M170 Bilateral primary osteoarthritis of knee: Secondary | ICD-10-CM

## 2025-01-24 ENCOUNTER — Ambulatory Visit: Admitting: Student

## 2025-02-03 ENCOUNTER — Telehealth: Payer: Self-pay

## 2025-02-03 DIAGNOSIS — E782 Mixed hyperlipidemia: Secondary | ICD-10-CM

## 2025-02-03 MED ORDER — SIMVASTATIN 10 MG PO TABS: 10.0000 mg | ORAL_TABLET | Freq: Every day | ORAL | 0 refills | Status: AC

## 2025-02-03 NOTE — Telephone Encounter (Signed)
 Prescription sent to pharmacy.   Called patient to inform her rx was sent. Unable to reach patient.

## 2025-02-03 NOTE — Addendum Note (Signed)
 Addended by: CAMACHO OCAMPO, Medard Decuir M on: 02/03/2025 10:16 AM   Modules accepted: Orders

## 2025-02-03 NOTE — Telephone Encounter (Signed)
 Copied from CRM #[id number]. Topic: Clinical - Medication Refill >> Feb 03, 2025  9:19 AM Carlyon D wrote: Medication: simvastatin  (ZOCOR ) 10 MG tablet  Has the patient contacted their pharmacy? Yes (Agent: If no, request that the patient contact the pharmacy for the refill. If patient does not wish to contact the pharmacy document the reason why and proceed with request.) (Agent: If yes, when and what did the pharmacy advise?)  This is the patient's preferred pharmacy:  [facility] [phone] [address], [place] - [address] [place] [address] Phone: [phone] Fax: [phone]  Is this the correct pharmacy for this prescription? Yes If no, delete pharmacy and type the correct one.   Has the prescription been filled recently? No  Is the patient out of the medication? No 1 week left   Has the patient been seen for an appointment in the last year OR does the patient have an upcoming appointment? Yes  Can we respond through MyChart? No prefers phone call   Agent: Please be advised that Rx refills may take up to 3 business days. We ask that you follow-up with your pharmacy.

## 2025-04-28 ENCOUNTER — Encounter: Admitting: Student
